# Patient Record
Sex: Female | Born: 1950 | Race: White | Hispanic: No | Marital: Married | Smoking: Never smoker
Health system: Southern US, Community
[De-identification: ages and names within clinical notes are randomized; demographics above are authoritative.]

## PROBLEM LIST (undated history)

## (undated) DIAGNOSIS — K219 Gastro-esophageal reflux disease without esophagitis: Secondary | ICD-10-CM

## (undated) DIAGNOSIS — N898 Other specified noninflammatory disorders of vagina: Secondary | ICD-10-CM

## (undated) DIAGNOSIS — E7800 Pure hypercholesterolemia, unspecified: Secondary | ICD-10-CM

## (undated) DIAGNOSIS — I10 Essential (primary) hypertension: Secondary | ICD-10-CM

## (undated) DIAGNOSIS — R42 Dizziness and giddiness: Secondary | ICD-10-CM

## (undated) DIAGNOSIS — K5730 Diverticulosis of large intestine without perforation or abscess without bleeding: Secondary | ICD-10-CM

## (undated) HISTORY — PX: TUBAL LIGATION: SHX77

## (undated) HISTORY — DX: Diverticulosis of large intestine without perforation or abscess without bleeding: K57.30

## (undated) HISTORY — PX: APPENDECTOMY: SHX54

## (undated) HISTORY — DX: Dizziness and giddiness: R42

## (undated) HISTORY — DX: Other specified noninflammatory disorders of vagina: N89.8

## (undated) HISTORY — PX: BREAST BIOPSY: SHX20

## (undated) HISTORY — PX: BREAST EXCISIONAL BIOPSY: SUR124

---

## 2002-05-09 ENCOUNTER — Encounter: Payer: Self-pay | Admitting: Family Medicine

## 2002-05-09 ENCOUNTER — Encounter: Admission: RE | Admit: 2002-05-09 | Discharge: 2002-05-09 | Payer: Self-pay | Admitting: Family Medicine

## 2002-07-25 ENCOUNTER — Encounter: Admission: RE | Admit: 2002-07-25 | Discharge: 2002-07-25 | Payer: Self-pay | Admitting: Family Medicine

## 2002-07-25 ENCOUNTER — Encounter: Payer: Self-pay | Admitting: Family Medicine

## 2003-01-17 ENCOUNTER — Other Ambulatory Visit: Admission: RE | Admit: 2003-01-17 | Discharge: 2003-01-17 | Payer: Self-pay | Admitting: Family Medicine

## 2006-06-28 ENCOUNTER — Encounter: Admission: RE | Admit: 2006-06-28 | Discharge: 2006-06-28 | Payer: Self-pay | Admitting: Internal Medicine

## 2006-07-17 ENCOUNTER — Encounter: Admission: RE | Admit: 2006-07-17 | Discharge: 2006-07-17 | Payer: Self-pay | Admitting: Internal Medicine

## 2007-07-17 ENCOUNTER — Encounter: Admission: RE | Admit: 2007-07-17 | Discharge: 2007-07-17 | Payer: Self-pay | Admitting: Internal Medicine

## 2007-07-21 ENCOUNTER — Encounter: Admission: RE | Admit: 2007-07-21 | Discharge: 2007-07-21 | Payer: Self-pay | Admitting: Internal Medicine

## 2007-07-30 ENCOUNTER — Emergency Department (HOSPITAL_COMMUNITY): Admission: EM | Admit: 2007-07-30 | Discharge: 2007-07-31 | Payer: Self-pay | Admitting: Emergency Medicine

## 2007-09-04 ENCOUNTER — Encounter: Admission: RE | Admit: 2007-09-04 | Discharge: 2007-09-04 | Payer: Self-pay | Admitting: Cardiology

## 2007-09-08 ENCOUNTER — Ambulatory Visit (HOSPITAL_COMMUNITY): Admission: RE | Admit: 2007-09-08 | Discharge: 2007-09-08 | Payer: Self-pay | Admitting: Cardiology

## 2008-08-28 ENCOUNTER — Encounter: Admission: RE | Admit: 2008-08-28 | Discharge: 2008-08-28 | Payer: Self-pay | Admitting: Internal Medicine

## 2009-08-20 ENCOUNTER — Encounter: Admission: RE | Admit: 2009-08-20 | Discharge: 2009-08-20 | Payer: Self-pay | Admitting: Internal Medicine

## 2010-09-24 ENCOUNTER — Encounter: Admission: RE | Admit: 2010-09-24 | Discharge: 2010-09-24 | Payer: Self-pay | Admitting: Obstetrics and Gynecology

## 2011-01-17 ENCOUNTER — Encounter (HOSPITAL_COMMUNITY): Payer: Self-pay | Admitting: Obstetrics and Gynecology

## 2011-01-17 ENCOUNTER — Encounter: Payer: Self-pay | Admitting: Internal Medicine

## 2011-05-11 NOTE — Cardiovascular Report (Signed)
Brenda Contreras, Brenda Contreras               ACCOUNT NO.:  [id number]   MEDICAL RECORD NO.:  [id number]          PATIENT TYPE:  OIB   LOCATION:  2854                         FACILITY:  [facility]   PHYSICIAN:  Nanetta Batty, M.D.   DATE OF BIRTH:  12/23/1951   DATE OF PROCEDURE:  09/08/2007  DATE OF DISCHARGE:  09/08/2007                            CARDIAC CATHETERIZATION   PROCEDURES PERFORMED:  1. Cardiac catheterization.  2. Selective coronary angiography.  3. Left ventriculography.   CARDIOLOGIST:  Nanetta Batty, M.D.   HISTORY OF THE PRESENT ILLNESS:  The patient is a 60 year old moderately  overweight white female with positive risk factors and a negative  Myoview a year ago with ongoing chest pain.  She was seen by Dr. Jacinto Halim  and was referred for diagnostic coronary arteriography to define her  anatomy and rule out ischemic etiology.   DESCRIPTION OF THE PROCEDURE:  The patient was brought to the second  floor [place] cardiac cath lab in the postabsorptive state.  She was  premedicated with per os Valium, IV fentanyl and Versed.  Her right  groin was prepped and shaved in the usual sterile fashion.  Four  milligrams of Xylocaine was use for local anesthesia.  A 6- French  sheath was inserted into the right femoral artery using the standard  Seldinger technique.  Six French right and left Judkins diagnostic  catheters as well as a 6-French pigtail catheter were used for selective  coronary angiography and left ventriculography respectively.  Visipaque  dye was used for the entirety of the case.  Aortic, left ventricular and  pullback pressures were recorded.   RESULTS:   HEMODYNAMIC DATA:  1. Aortic systolic pressure 179 and diastolic pressure 90.  2. Left ventricular systolic pressure 177 and diastolic pressure 43.   ANGIOGRAPHIC DATA:  Selective coronary angiography:  1. Left Main:  The left main is normal.   1. Left Anterior Descending:  The LAD is normal.   1.  Circumflex:  The left circumflex is normal.   1. Right Coronary Artery:  The right coronary artery was dominant and      normal.   1. Left Ventriculography:  RAO left ventriculogram was performed using      25 mL of Visipaque dye at 12 mL per second.  The overall LVEF was      estimated a greater than 60% without focal wall motion      abnormalities.   IMPRESSION:  The patient has some normal coronaries and normal left  ventricular function.  I believe her chest pain is noncardiac.  Empiric  antireflux therapy will be recommended.   The sheaths were removed and pressure was applied to the groin to  achieve hemostasis.   The patient left lab in stable condition.  She will be discharged home  later today as an outpatient; and, she will see Dr. Jacinto Halim back in follow-  up.      Nanetta Batty, M.D.  Electronically Signed     JB/MEDQ  D:  09/08/2007  T:  09/09/2007  Job:  [id number]   cc:   Second  Floor [facility]  [facility]  Robyn N. Allyne Gee, M.D.

## 2011-10-08 ENCOUNTER — Other Ambulatory Visit: Payer: Self-pay | Admitting: Obstetrics and Gynecology

## 2011-10-08 DIAGNOSIS — R928 Other abnormal and inconclusive findings on diagnostic imaging of breast: Secondary | ICD-10-CM

## 2011-10-11 LAB — I-STAT 8, (EC8 V) (CONVERTED LAB)
Acid-Base Excess: 1
BUN: 13
Bicarbonate: 26.1 — ABNORMAL HIGH
Chloride: 106
Glucose, Bld: 175 — ABNORMAL HIGH
HCT: 38
Hemoglobin: 12.9
Operator id: 277751
Potassium: 3.8
Sodium: 140
TCO2: 27
pCO2, Ven: 41.4 — ABNORMAL LOW
pH, Ven: 7.408 — ABNORMAL HIGH

## 2011-10-11 LAB — POCT I-STAT CREATININE
Creatinine, Ser: 1.4 — ABNORMAL HIGH
Operator id: 277751

## 2011-10-11 LAB — CBC
HCT: 35.8 — ABNORMAL LOW
Hemoglobin: 11.9 — ABNORMAL LOW
MCHC: 33.1
MCV: 85.3
Platelets: 216
RBC: 4.2
RDW: 13.8
WBC: 6.5

## 2011-10-11 LAB — DIFFERENTIAL
Basophils Absolute: 0
Basophils Relative: 1
Eosinophils Absolute: 0.2
Eosinophils Relative: 3
Lymphocytes Relative: 33
Lymphs Abs: 2.1
Monocytes Absolute: 0.6
Monocytes Relative: 10
Neutro Abs: 3.5
Neutrophils Relative %: 54

## 2011-10-11 LAB — POCT CARDIAC MARKERS
CKMB, poc: 1.3
Myoglobin, poc: 171
Operator id: 277751
Troponin i, poc: <0.05

## 2011-10-20 ENCOUNTER — Ambulatory Visit
Admission: RE | Admit: 2011-10-20 | Discharge: 2011-10-20 | Disposition: A | Discharge status: Home / Self Care | Source: Ambulatory Visit | Attending: Obstetrics and Gynecology | Admitting: Obstetrics and Gynecology

## 2011-10-20 ENCOUNTER — Other Ambulatory Visit: Payer: Self-pay | Admitting: Obstetrics and Gynecology

## 2011-10-20 DIAGNOSIS — R921 Mammographic calcification found on diagnostic imaging of breast: Secondary | ICD-10-CM

## 2011-10-20 DIAGNOSIS — R928 Other abnormal and inconclusive findings on diagnostic imaging of breast: Secondary | ICD-10-CM

## 2011-10-22 ENCOUNTER — Encounter

## 2011-11-01 ENCOUNTER — Other Ambulatory Visit: Payer: Self-pay | Admitting: Radiology

## 2011-11-01 ENCOUNTER — Ambulatory Visit
Admission: RE | Admit: 2011-11-01 | Discharge: 2011-11-01 | Disposition: A | Discharge status: Home / Self Care | Source: Ambulatory Visit | Attending: Obstetrics and Gynecology | Admitting: Obstetrics and Gynecology

## 2011-11-01 DIAGNOSIS — R921 Mammographic calcification found on diagnostic imaging of breast: Secondary | ICD-10-CM

## 2012-08-02 ENCOUNTER — Encounter (HOSPITAL_COMMUNITY): Payer: Self-pay | Admitting: Emergency Medicine

## 2012-08-02 ENCOUNTER — Emergency Department (HOSPITAL_COMMUNITY)

## 2012-08-02 ENCOUNTER — Emergency Department (HOSPITAL_COMMUNITY)
Admission: EM | Admit: 2012-08-02 | Discharge: 2012-08-03 | Disposition: A | Discharge status: Home / Self Care | Attending: Emergency Medicine | Admitting: Emergency Medicine

## 2012-08-02 DIAGNOSIS — E7800 Pure hypercholesterolemia, unspecified: Secondary | ICD-10-CM | POA: Insufficient documentation

## 2012-08-02 DIAGNOSIS — N390 Urinary tract infection, site not specified: Secondary | ICD-10-CM

## 2012-08-02 DIAGNOSIS — E119 Type 2 diabetes mellitus without complications: Secondary | ICD-10-CM | POA: Insufficient documentation

## 2012-08-02 DIAGNOSIS — I10 Essential (primary) hypertension: Secondary | ICD-10-CM | POA: Insufficient documentation

## 2012-08-02 DIAGNOSIS — M549 Dorsalgia, unspecified: Secondary | ICD-10-CM

## 2012-08-02 DIAGNOSIS — K219 Gastro-esophageal reflux disease without esophagitis: Secondary | ICD-10-CM | POA: Insufficient documentation

## 2012-08-02 HISTORY — DX: Gastro-esophageal reflux disease without esophagitis: K21.9

## 2012-08-02 HISTORY — DX: Essential (primary) hypertension: I10

## 2012-08-02 HISTORY — DX: Pure hypercholesterolemia, unspecified: E78.00

## 2012-08-02 NOTE — ED Notes (Signed)
NP at bedside.
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
## 2012-08-02 NOTE — ED Notes (Addendum)
Pt c/o pain in lower back and bil hips onset this am.  No known injury.  St's pain mostly in hips.

## 2012-08-03 LAB — URINALYSIS, ROUTINE W REFLEX MICROSCOPIC
Bilirubin Urine: NEGATIVE
Glucose, UA: NEGATIVE mg/dL
Hgb urine dipstick: NEGATIVE
Ketones, ur: NEGATIVE mg/dL
Nitrite: NEGATIVE
Protein, ur: NEGATIVE mg/dL
Specific Gravity, Urine: 1.01 (ref 1.005–1.030)
Urobilinogen, UA: 0.2 mg/dL (ref 0.0–1.0)
pH: 5.5 (ref 5.0–8.0)

## 2012-08-03 LAB — URINE MICROSCOPIC-ADD ON

## 2012-08-03 MED ORDER — SULFAMETHOXAZOLE-TMP DS 800-160 MG PO TABS
1.0000 | ORAL_TABLET | Freq: Once | ORAL | Status: AC
  Administered 2012-08-03: 1 via ORAL
  Filled 2012-08-03: qty 1

## 2012-08-03 MED ORDER — SULFAMETHOXAZOLE-TMP DS 800-160 MG PO TABS: 1.0000 | ORAL_TABLET | Freq: Two times a day (BID) | ORAL | Status: AC

## 2012-08-03 MED ORDER — HYDROCODONE-ACETAMINOPHEN 5-325 MG PO TABS
1.0000 | ORAL_TABLET | Freq: Once | ORAL | Status: AC
  Administered 2012-08-03: 1 via ORAL
  Filled 2012-08-03: qty 1

## 2012-08-03 MED ORDER — HYDROCODONE-ACETAMINOPHEN 5-325 MG PO TABS: 1.0000 | ORAL_TABLET | Freq: Four times a day (QID) | ORAL | Status: AC | PRN

## 2012-08-03 NOTE — ED Provider Notes (Signed)
Medical screening examination/treatment/procedure(s) were performed by non-physician practitioner and as supervising physician I was immediately available for consultation/collaboration.  Kathy Wares K Talana Slatten-Rasch, MD 08/03/12 0425 

## 2013-06-18 ENCOUNTER — Other Ambulatory Visit: Payer: Self-pay | Admitting: Gastroenterology

## 2013-06-18 DIAGNOSIS — R109 Unspecified abdominal pain: Secondary | ICD-10-CM

## 2013-06-25 ENCOUNTER — Ambulatory Visit
Admission: RE | Admit: 2013-06-25 | Discharge: 2013-06-25 | Disposition: A | Discharge status: Home / Self Care | Source: Ambulatory Visit | Attending: Gastroenterology | Admitting: Gastroenterology

## 2013-06-25 DIAGNOSIS — R109 Unspecified abdominal pain: Secondary | ICD-10-CM

## 2013-06-25 MED ORDER — IOHEXOL 300 MG/ML  SOLN
100.0000 mL | Freq: Once | INTRAMUSCULAR | Status: AC | PRN
  Administered 2013-06-25: 100 mL via INTRAVENOUS

## 2013-09-25 ENCOUNTER — Other Ambulatory Visit: Payer: Self-pay | Admitting: Obstetrics and Gynecology

## 2013-09-25 DIAGNOSIS — R928 Other abnormal and inconclusive findings on diagnostic imaging of breast: Secondary | ICD-10-CM

## 2013-10-12 ENCOUNTER — Ambulatory Visit
Admission: RE | Admit: 2013-10-12 | Discharge: 2013-10-12 | Disposition: A | Discharge status: Home / Self Care | Source: Ambulatory Visit | Attending: Obstetrics and Gynecology | Admitting: Obstetrics and Gynecology

## 2013-10-12 DIAGNOSIS — R928 Other abnormal and inconclusive findings on diagnostic imaging of breast: Secondary | ICD-10-CM

## 2013-12-16 ENCOUNTER — Encounter (HOSPITAL_COMMUNITY): Payer: Self-pay | Admitting: Emergency Medicine

## 2013-12-16 ENCOUNTER — Emergency Department (INDEPENDENT_AMBULATORY_CARE_PROVIDER_SITE_OTHER)
Admission: EM | Admit: 2013-12-16 | Discharge: 2013-12-16 | Disposition: A | Discharge status: Home / Self Care | Source: Home / Self Care | Attending: Family Medicine | Admitting: Family Medicine

## 2013-12-16 DIAGNOSIS — J069 Acute upper respiratory infection, unspecified: Secondary | ICD-10-CM

## 2013-12-16 MED ORDER — IPRATROPIUM BROMIDE 0.03 % NA SOLN: 2.0000 | Freq: Two times a day (BID) | NASAL | Status: DC

## 2013-12-16 MED ORDER — BENZONATATE 100 MG PO CAPS: 100.0000 mg | ORAL_CAPSULE | Freq: Three times a day (TID) | ORAL | Status: DC | PRN

## 2013-12-16 NOTE — ED Notes (Signed)
C/o cold sx for two days now States she has been sneezing, body ache, runny nose, dry cough OTC medication taking but no relief.

## 2013-12-16 NOTE — ED Provider Notes (Signed)
CSN: [id number]     Arrival date & time 12/16/13  1458 History   First MD Initiated Contact with Patient 12/16/13 1548     Chief Complaint  Patient presents with  . URI   (Consider location/radiation/quality/duration/timing/severity/associated sxs/prior Treatment) Patient is a 62 y.o. female presenting with URI. The history is provided by the patient.  URI Presenting symptoms: congestion, cough and rhinorrhea   Presenting symptoms: no ear pain, no facial pain, no fever and no sore throat   Presenting symptoms comment:  +sneezing and myalgias Severity:  Moderate Onset quality:  Gradual Duration:  3 days Progression:  Unchanged Chronicity:  New Associated symptoms: myalgias and sneezing     Past Medical History  Diagnosis Date  . Diabetes mellitus   . Hypertension   . High cholesterol   . GERD (gastroesophageal reflux disease)    History reviewed. No pertinent past surgical history. History reviewed. No pertinent family history. History  Substance Use Topics  . Smoking status: Never Smoker   . Smokeless tobacco: Not on file  . Alcohol Use: No   OB History   Grav Para Term Preterm Abortions TAB SAB Ect Mult Living                 Review of Systems  Constitutional: Negative for fever.  HENT: Positive for congestion, rhinorrhea and sneezing. Negative for ear pain and sore throat.   Respiratory: Positive for cough.   Musculoskeletal: Positive for myalgias.  All other systems reviewed and are negative.    Allergies  Review of patient's allergies indicates no known allergies.  Home Medications   Current Outpatient Rx  Name  Route  Sig  Dispense  Refill  . amLODipine (NORVASC) 5 MG tablet   Oral   Take 5 mg by mouth daily.         Marland Kitchen aspirin EC 81 MG tablet   Oral   Take 81 mg by mouth daily.         . benzonatate (TESSALON) 100 MG capsule   Oral   Take 1 capsule (100 mg total) by mouth 3 (three) times daily as needed for cough.   21 capsule   0   .  ipratropium (ATROVENT) 0.03 % nasal spray   Each Nare   Place 2 sprays into both nostrils every 12 (twelve) hours.   30 mL   12   . olmesartan-hydrochlorothiazide (BENICAR HCT) 40-25 MG per tablet   Oral   Take 1 tablet by mouth daily.         Marland Kitchen omeprazole-sodium bicarbonate (ZEGERID) 40-1100 MG per capsule   Oral   Take 1 capsule by mouth daily before breakfast.         . pregabalin (LYRICA) 50 MG capsule   Oral   Take 50 mg by mouth daily.         . rosuvastatin (CRESTOR) 10 MG tablet   Oral   Take 10 mg by mouth every evening.         . sitaGLIPtan-metformin (JANUMET) 50-1000 MG per tablet   Oral   Take 1 tablet by mouth 2 (two) times daily with a meal.          BP 151/77  Pulse 107  Temp(Src) 98.4 F (36.9 C) (Oral)  Resp 20  SpO2 98% Physical Exam  Nursing note and vitals reviewed. Constitutional: She is oriented to person, place, and time. She appears well-developed and well-nourished. No distress.  HENT:  Head: Normocephalic and atraumatic.  Right Ear: Hearing, tympanic membrane, external ear and ear canal normal.  Left Ear: Hearing, tympanic membrane, external ear and ear canal normal.  Nose: Nose normal.  Mouth/Throat: Uvula is midline, oropharynx is clear and moist and mucous membranes are normal.  Eyes: Conjunctivae are normal. Pupils are equal, round, and reactive to light. Right eye exhibits no discharge. Left eye exhibits no discharge. No scleral icterus.  Neck: Normal range of motion. Neck supple. No thyromegaly present.  Cardiovascular: Normal rate, regular rhythm and normal heart sounds.   Pulmonary/Chest: Effort normal and breath sounds normal. No respiratory distress. She has no wheezes.  Abdominal: Soft. Bowel sounds are normal. There is no tenderness.  Musculoskeletal: Normal range of motion.  Lymphadenopathy:    She has no cervical adenopathy.  Neurological: She is alert and oriented to person, place, and time.  Skin: Skin is warm and  dry. No rash noted.  Psychiatric: She has a normal mood and affect. Her behavior is normal.    ED Course  Procedures (including critical care time) Labs Review Labs Reviewed - No data to display Imaging Review No results found.  EKG Interpretation    Date/Time:    Ventricular Rate:    PR Interval:    QRS Duration:   QT Interval:    QTC Calculation:   R Axis:     Text Interpretation:              MDM  Exam consistent with mild URI. Symptomatic care at home with Atrovent nasal spray and tessalon. PCP follow up if no improvement over next 5-7 days.     Jess Barters [place], [place] 12/16/13 [phone]

## 2013-12-17 NOTE — ED Provider Notes (Signed)
Medical screening examination/treatment/procedure(s) were performed by a resident physician or non-physician practitioner and as the supervising physician I was immediately available for consultation/collaboration.  Taite Schoeppner, MD    Aylinn Rydberg S Nahzir Pohle, MD 12/17/13 0739 

## 2014-04-30 ENCOUNTER — Emergency Department (HOSPITAL_COMMUNITY)
Admission: EM | Admit: 2014-04-30 | Discharge: 2014-05-01 | Disposition: A | Discharge status: Home / Self Care | Attending: Emergency Medicine | Admitting: Emergency Medicine

## 2014-04-30 ENCOUNTER — Emergency Department (HOSPITAL_COMMUNITY)

## 2014-04-30 ENCOUNTER — Encounter (HOSPITAL_COMMUNITY): Payer: Self-pay | Admitting: Emergency Medicine

## 2014-04-30 DIAGNOSIS — E7800 Pure hypercholesterolemia, unspecified: Secondary | ICD-10-CM | POA: Insufficient documentation

## 2014-04-30 DIAGNOSIS — Z7982 Long term (current) use of aspirin: Secondary | ICD-10-CM | POA: Insufficient documentation

## 2014-04-30 DIAGNOSIS — Z9889 Other specified postprocedural states: Secondary | ICD-10-CM | POA: Insufficient documentation

## 2014-04-30 DIAGNOSIS — E119 Type 2 diabetes mellitus without complications: Secondary | ICD-10-CM | POA: Insufficient documentation

## 2014-04-30 DIAGNOSIS — K219 Gastro-esophageal reflux disease without esophagitis: Secondary | ICD-10-CM | POA: Insufficient documentation

## 2014-04-30 DIAGNOSIS — Z79899 Other long term (current) drug therapy: Secondary | ICD-10-CM | POA: Insufficient documentation

## 2014-04-30 DIAGNOSIS — I10 Essential (primary) hypertension: Secondary | ICD-10-CM | POA: Insufficient documentation

## 2014-04-30 DIAGNOSIS — R0789 Other chest pain: Secondary | ICD-10-CM | POA: Insufficient documentation

## 2014-04-30 DIAGNOSIS — H81399 Other peripheral vertigo, unspecified ear: Secondary | ICD-10-CM | POA: Insufficient documentation

## 2014-04-30 LAB — CBC
HCT: 37 % (ref 36.0–46.0)
Hemoglobin: 12 g/dL (ref 12.0–15.0)
MCH: 28.1 pg (ref 26.0–34.0)
MCHC: 32.4 g/dL (ref 30.0–36.0)
MCV: 86.7 fL (ref 78.0–100.0)
Platelets: 230 10*3/uL (ref 150–400)
RBC: 4.27 MIL/uL (ref 3.87–5.11)
RDW: 14.7 % (ref 11.5–15.5)
WBC: 7.5 10*3/uL (ref 4.0–10.5)

## 2014-04-30 LAB — BASIC METABOLIC PANEL
BUN: 19 mg/dL (ref 6–23)
CO2: 24 mEq/L (ref 19–32)
Calcium: 10.1 mg/dL (ref 8.4–10.5)
Chloride: 96 mEq/L (ref 96–112)
Creatinine, Ser: 0.97 mg/dL (ref 0.50–1.10)
GFR calc Af Amer: 71 mL/min — ABNORMAL LOW (ref >90)
GFR calc non Af Amer: 61 mL/min — ABNORMAL LOW (ref >90)
Glucose, Bld: 91 mg/dL (ref 70–99)
Potassium: 3.6 mEq/L — ABNORMAL LOW (ref 3.7–5.3)
Sodium: 136 mEq/L — ABNORMAL LOW (ref 137–147)

## 2014-04-30 LAB — I-STAT TROPONIN, ED: Troponin i, poc: 0.01 ng/mL (ref 0.00–0.08)

## 2014-04-30 MED ORDER — ASPIRIN 81 MG PO CHEW
324.0000 mg | CHEWABLE_TABLET | Freq: Once | ORAL | Status: AC
  Administered 2014-05-01: 324 mg via ORAL
  Filled 2014-04-30: qty 4

## 2014-04-30 MED ORDER — MECLIZINE HCL 25 MG PO TABS
25.0000 mg | ORAL_TABLET | Freq: Once | ORAL | Status: AC
  Administered 2014-05-01: 25 mg via ORAL
  Filled 2014-04-30: qty 1

## 2014-04-30 MED ORDER — POTASSIUM CHLORIDE CRYS ER 20 MEQ PO TBCR
40.0000 meq | EXTENDED_RELEASE_TABLET | Freq: Once | ORAL | Status: AC
  Administered 2014-05-01: 40 meq via ORAL
  Filled 2014-04-30: qty 2

## 2014-04-30 MED ORDER — PANTOPRAZOLE SODIUM 40 MG PO TBEC
40.0000 mg | DELAYED_RELEASE_TABLET | Freq: Once | ORAL | Status: AC
  Administered 2014-05-01: 40 mg via ORAL
  Filled 2014-04-30: qty 1

## 2014-04-30 NOTE — ED Notes (Signed)
Pt reports feeling dizzy today and then started to have CP in afternoon; pt denies n/v, reports slight SOB

## 2014-04-30 NOTE — ED Provider Notes (Signed)
CSN: [id number]     Arrival date & time 04/30/14  1956 History   First MD Initiated Contact with Patient 04/30/14 2259     Chief Complaint  Patient presents with  . Chest Pain  . Dizziness     (Consider location/radiation/quality/duration/timing/severity/associated sxs/prior Treatment) Patient is a 63 y.o. female presenting with chest pain and dizziness. The history is provided by the patient.  Chest Pain Associated symptoms: dizziness   Dizziness Associated symptoms: chest pain   She had onset this afternoon of dizziness. She states the dizziness is a sense of feeling off-balance and is worse when she stands up but resolves after she stays standing. She denies any spinning sensation or sensation of blacking out. There is no associated nausea or vomiting. She denies tinnitus or decreased hearing or ear pain. This evening, she started having episodes of chest pain. Pain was a dull feeling in the left anterior chest and a burning sensation in the retrosternal area. This comes and goes. Her last about 5-10 minutes before resolving. She states that she is breathing a little faster than normal but not truly dyspneic. She denies nausea or vomiting or diaphoresis. Nothing seems to make the pain better or worse. It is not exertional and not positional. She did have a heart catheterization in the past and was told that it was normal and she continues to see a cardiologist. Last stress test was about 2 years ago and was reported to be normal. She does have cardiac risk factors of diabetes, hypertension, hyperlipidemia and a twin sister has had a heart attack and has coronary stents. She is a nonsmoker. Also, she is on omeprazole for acid reflux. She states that when she had her heart catheterization, she was told that her chest pain at that point was due to acid reflux.  Past Medical History  Diagnosis Date  . Diabetes mellitus   . Hypertension   . High cholesterol   . GERD (gastroesophageal reflux  disease)    History reviewed. No pertinent past surgical history. History reviewed. No pertinent family history. History  Substance Use Topics  . Smoking status: Never Smoker   . Smokeless tobacco: Not on file  . Alcohol Use: No   OB History   Grav Para Term Preterm Abortions TAB SAB Ect Mult Living                 Review of Systems  Cardiovascular: Positive for chest pain.  Neurological: Positive for dizziness.  All other systems reviewed and are negative.     Allergies  Review of patient's allergies indicates no known allergies.  Home Medications   Prior to Admission medications   Medication Sig Start Date End Date Taking? Authorizing Provider  amLODipine (NORVASC) 5 MG tablet Take 5 mg by mouth daily.    Historical Provider, MD  aspirin EC 81 MG tablet Take 81 mg by mouth daily.    Historical Provider, MD  benzonatate (TESSALON) 100 MG capsule Take 1 capsule (100 mg total) by mouth 3 (three) times daily as needed for cough. 12/16/13   Lahoma Rocker, PA  ipratropium (ATROVENT) 0.03 % nasal spray Place 2 sprays into both nostrils every 12 (twelve) hours. 12/16/13   Lahoma Rocker, PA  olmesartan-hydrochlorothiazide (BENICAR HCT) 40-25 MG per tablet Take 1 tablet by mouth daily.    Historical Provider, MD  omeprazole-sodium bicarbonate (ZEGERID) 40-1100 MG per capsule Take 1 capsule by mouth daily before breakfast.    Historical Provider, MD  pregabalin (  LYRICA) 50 MG capsule Take 50 mg by mouth daily.    Historical Provider, MD  rosuvastatin (CRESTOR) 10 MG tablet Take 10 mg by mouth every evening.    Historical Provider, MD  sitaGLIPtan-metformin (JANUMET) 50-1000 MG per tablet Take 1 tablet by mouth 2 (two) times daily with a meal.    Historical Provider, MD   BP 154/71  Pulse 80  Temp(Src) 98 F (36.7 C) (Oral)  Resp 18  Ht 5\' 3"  (1.6 m)  Wt 157 lb (71.215 kg)  BMI 27.82 kg/m2  SpO2 98% Physical Exam  Nursing note and vitals reviewed.  63 year old  female, resting comfortably and in no acute distress. Vital signs are significant for hypertension with blood pressure 154/71. Oxygen saturation is 98%, which is normal. Head is normocephalic and atraumatic. PERRLA, EOMI. Oropharynx is clear. No nystagmus is noted on lateral gaze to both sides and she does have mild dizziness with this. Dizziness is not reproduced by passive head movement. Neck is nontender and supple without adenopathy or JVD. Back is nontender and there is no CVA tenderness. Lungs are clear without rales, wheezes, or rhonchi. Chest is nontender. Heart has regular rate and rhythm without murmur. Abdomen is soft, flat, nontender without masses or hepatosplenomegaly and peristalsis is normoactive. Extremities have no cyanosis or edema, full range of motion is present. Skin is warm and dry without rash. Neurologic: Mental status is normal, cranial nerves are intact, there are no motor or sensory deficits.  ED Course  Procedures (including critical care time) Labs Review Labs Reviewed  BASIC METABOLIC PANEL - Abnormal; Notable for the following:    Sodium 136 (*)    Potassium 3.6 (*)    GFR calc non Af Amer 61 (*)    GFR calc Af Amer 71 (*)    All other components within normal limits  CBC  I-STAT TROPOININ, ED    Imaging Review Dg Chest 2 View  04/30/2014   CLINICAL DATA:  Chest pain and dizziness  EXAM: CHEST  2 VIEW  COMPARISON:  09/04/2007  FINDINGS: Normal heart size and mediastinal contours. No acute infiltrate or edema. No effusion or pneumothorax. No acute osseous findings.  IMPRESSION: No active cardiopulmonary disease.   Electronically Signed   By: Jorje Guild M.D.   On: 04/30/2014 21:16     EKG Interpretation   Date/Time:  Tuesday Apr 30 2014 20:06:50 EDT Ventricular Rate:  82 PR Interval:  142 QRS Duration: 84 QT Interval:  384 QTC Calculation: 448 R Axis:   38 Text Interpretation:  Normal sinus rhythm Normal ECG No old tracing to  compare  Confirmed by [facility]  MD, Mikeala Girdler ([id number]) on 04/30/2014 11:01:33 PM      MDM   Final diagnoses:  Peripheral vertigo   Chest pain of uncertain cause. Dizziness which most likely is vertigo given her nystagmus. Old records are reviewed and she had a heart catheterization in 2008 showing completely normal coronary arteries. Although it has been 7 years, it is unlikely that she has developed critical coronary atherosclerosis in that time. ECG is normal as is troponin. Potassium is slightly low at 3.6 which is probably related to diabetic which she takes for hypertension. She will be given a dose of potassium and meclizine. She is also given a dose of pantoprazole. Orthostatic vital signs will be checked.  Orthostatic vital signs show no significant changes in heart rate or blood pressure. She feels significantly better after oral meclizine but does note that dizziness  is now provoked by looking to either side. This is consistent with peripheral vertigo. She is discharged with prescriptions for meclizine and is advised that she should increase her omeprazole to twice a day for the next several days. Followup with PCP.  Delora Fuel, MD 76/72/09 [id number]

## 2014-05-01 MED ORDER — MECLIZINE HCL 25 MG PO TABS: 25.0000 mg | ORAL_TABLET | Freq: Three times a day (TID) | ORAL | Status: DC | PRN

## 2014-05-01 NOTE — Discharge Instructions (Signed)
Vertigo Vertigo means you feel like you or your surroundings are moving when they are not. Vertigo can be dangerous if it occurs when you are at work, driving, or performing difficult activities.  CAUSES  Vertigo occurs when there is a conflict of signals sent to your brain from the visual and sensory systems in your body. There are many different causes of vertigo, including:  Infections, especially in the inner ear.  A bad reaction to a drug or misuse of alcohol and medicines.  Withdrawal from drugs or alcohol.  Rapidly changing positions, such as lying down or rolling over in bed.  A migraine headache.  Decreased blood flow to the brain.  Increased pressure in the brain from a head injury, infection, tumor, or bleeding. SYMPTOMS  You may feel as though the world is spinning around or you are falling to the ground. Because your balance is upset, vertigo can cause nausea and vomiting. You may have involuntary eye movements (nystagmus). DIAGNOSIS  Vertigo is usually diagnosed by physical exam. If the cause of your vertigo is unknown, your caregiver may perform imaging tests, such as an MRI scan (magnetic resonance imaging). TREATMENT  Most cases of vertigo resolve on their own, without treatment. Depending on the cause, your caregiver may prescribe certain medicines. If your vertigo is related to body position issues, your caregiver may recommend movements or procedures to correct the problem. In rare cases, if your vertigo is caused by certain inner ear problems, you may need surgery. HOME CARE INSTRUCTIONS   Follow your caregiver's instructions.  Avoid driving.  Avoid operating heavy machinery.  Avoid performing any tasks that would be dangerous to you or others during a vertigo episode.  Tell your caregiver if you notice that certain medicines seem to be causing your vertigo. Some of the medicines used to treat vertigo episodes can actually make them worse in some people. SEEK  IMMEDIATE MEDICAL CARE IF:   Your medicines do not relieve your vertigo or are making it worse.  You develop problems with talking, walking, weakness, or using your arms, hands, or legs.  You develop severe headaches.  Your nausea or vomiting continues or gets worse.  You develop visual changes.  A family member notices behavioral changes.  Your condition gets worse. MAKE SURE YOU:  Understand these instructions.  Will watch your condition.  Will get help right away if you are not doing well or get worse. Document Released: 09/22/2005 Document Revised: 03/06/2012 Document Reviewed: 07/01/2011 [facility] Patient Information 2014 [place].  Meclizine tablets or capsules What is this medicine? MECLIZINE (MEK li zeen) is an antihistamine. It is used to prevent nausea, vomiting, or dizziness caused by motion sickness. It is also used to prevent and treat vertigo (extreme dizziness or a feeling that you or your surroundings are tilting or spinning around). This medicine may be used for other purposes; ask your health care provider or pharmacist if you have questions. COMMON BRAND NAME(S): Antivert, Dramamine Less Drowsy, Medivert, Meni-D  What should I tell my health care provider before I take this medicine? They need to know if you have any of these conditions: -asthma -glaucoma -prostate trouble -stomach problems -urinary problems -an unusual or allergic reaction to meclizine, other medicines, foods, dyes, or preservatives -pregnant or trying to get pregnant -breast-feeding How should I use this medicine? Take this medicine by mouth with a glass of water. Follow the directions on the prescription label. If you are using this medicine to prevent motion sickness, take  the dose at least 1 hour before travel. If it upsets your stomach, take it with food or milk. Take your doses at regular intervals. Do not take your medicine more often than directed. Talk to your pediatrician  regarding the use of this medicine in children. Special care may be needed. Overdosage: If you think you have taken too much of this medicine contact a poison control center or emergency room at once. NOTE: This medicine is only for you. Do not share this medicine with others. What if I miss a dose? If you miss a dose, take it as soon as you can. If it is almost time for your next dose, take only that dose. Do not take double or extra doses. What may interact with this medicine? -barbiturate medicines for inducing sleep or treating seizures -digoxin -medicines for anxiety or sleeping problems, like alprazolam, diazepam or temazepam -medicines for hay fever and other allergies -medicines for mental depression -medicines for movement abnormalities as in Parkinson's disease, or for stomach problems -medicines for pain -medicines that relax muscles This list may not describe all possible interactions. Give your health care provider a list of all the medicines, herbs, non-prescription drugs, or dietary supplements you use. Also tell them if you smoke, drink alcohol, or use illegal drugs. Some items may interact with your medicine. What should I watch for while using this medicine? If you are taking this medicine on a regular schedule, visit your doctor or health care professional for regular checks on your progress. You may get dizzy, drowsy or have blurred vision. Do not drive, use machinery, or do anything that needs mental alertness until you know how this medicine affects you. Do not stand or sit up quickly, especially if you are an older patient. This reduces the risk of dizzy or fainting spells. Alcohol can increase possible dizziness. Avoid alcoholic drinks. Your mouth may get dry. Chewing sugarless gum or sucking hard candy, and drinking plenty of water may help. Contact your doctor if the problem does not go away or is severe. This medicine may cause dry eyes and blurred vision. If you wear  contact lenses you may feel some discomfort. Lubricating drops may help. See your eye doctor if the problem does not go away or is severe. What side effects may I notice from receiving this medicine? Side effects that you should report to your doctor or health care professional as soon as possible: -fainting spells -fast or irregular heartbeat Side effects that usually do not require medical attention (report to your doctor or health care professional if they continue or are bothersome): -constipation -difficulty passing urine -difficulty sleeping -headache -stomach upset This list may not describe all possible side effects. Call your doctor for medical advice about side effects. You may report side effects to FDA at 1-800-FDA-1088. Where should I keep my medicine? Keep out of the reach of children. Store at room temperature between 15 and 30 degrees C (59 and 86 degrees F). Keep container tightly closed. Throw away any unused medicine after the expiration date. NOTE: This sheet is a summary. It may not cover all possible information. If you have questions about this medicine, talk to your doctor, pharmacist, or health care provider.  2014, [employer]vier/[employer] Standard. (2008-06-20 10:35:36)

## 2014-05-01 NOTE — ED Notes (Signed)
Patient has ride home with husband

## 2015-05-13 DIAGNOSIS — D251 Intramural leiomyoma of uterus: Secondary | ICD-10-CM | POA: Diagnosis not present

## 2015-08-27 DIAGNOSIS — E559 Vitamin D deficiency, unspecified: Secondary | ICD-10-CM | POA: Diagnosis not present

## 2015-08-27 DIAGNOSIS — Z0000 Encounter for general adult medical examination without abnormal findings: Secondary | ICD-10-CM | POA: Diagnosis not present

## 2015-08-27 DIAGNOSIS — E780 Pure hypercholesterolemia: Secondary | ICD-10-CM | POA: Diagnosis not present

## 2015-08-27 DIAGNOSIS — Z23 Encounter for immunization: Secondary | ICD-10-CM | POA: Diagnosis not present

## 2015-08-27 DIAGNOSIS — Z6829 Body mass index (BMI) 29.0-29.9, adult: Secondary | ICD-10-CM | POA: Diagnosis not present

## 2015-08-27 DIAGNOSIS — E1165 Type 2 diabetes mellitus with hyperglycemia: Secondary | ICD-10-CM | POA: Diagnosis not present

## 2015-08-27 DIAGNOSIS — I10 Essential (primary) hypertension: Secondary | ICD-10-CM | POA: Diagnosis not present

## 2015-11-10 DIAGNOSIS — Z6828 Body mass index (BMI) 28.0-28.9, adult: Secondary | ICD-10-CM | POA: Diagnosis not present

## 2015-11-10 DIAGNOSIS — Z1231 Encounter for screening mammogram for malignant neoplasm of breast: Secondary | ICD-10-CM | POA: Diagnosis not present

## 2015-11-10 DIAGNOSIS — Z01419 Encounter for gynecological examination (general) (routine) without abnormal findings: Secondary | ICD-10-CM | POA: Diagnosis not present

## 2015-11-27 DIAGNOSIS — E1165 Type 2 diabetes mellitus with hyperglycemia: Secondary | ICD-10-CM | POA: Diagnosis not present

## 2015-11-27 DIAGNOSIS — E7800 Pure hypercholesterolemia, unspecified: Secondary | ICD-10-CM | POA: Diagnosis not present

## 2015-11-27 DIAGNOSIS — Z6829 Body mass index (BMI) 29.0-29.9, adult: Secondary | ICD-10-CM | POA: Diagnosis not present

## 2015-11-27 DIAGNOSIS — I10 Essential (primary) hypertension: Secondary | ICD-10-CM | POA: Diagnosis not present

## 2016-01-13 ENCOUNTER — Encounter: Payer: Self-pay | Admitting: Family Medicine

## 2016-01-13 ENCOUNTER — Ambulatory Visit (INDEPENDENT_AMBULATORY_CARE_PROVIDER_SITE_OTHER): Payer: Medicare Other | Admitting: Family Medicine

## 2016-01-13 VITALS — BP 146/82 | HR 79 | Temp 98.1°F | Ht 62.75 in | Wt 159.8 lb

## 2016-01-13 DIAGNOSIS — E1165 Type 2 diabetes mellitus with hyperglycemia: Secondary | ICD-10-CM | POA: Insufficient documentation

## 2016-01-13 DIAGNOSIS — I10 Essential (primary) hypertension: Secondary | ICD-10-CM | POA: Diagnosis not present

## 2016-01-13 DIAGNOSIS — E785 Hyperlipidemia, unspecified: Secondary | ICD-10-CM | POA: Insufficient documentation

## 2016-01-13 DIAGNOSIS — E119 Type 2 diabetes mellitus without complications: Secondary | ICD-10-CM | POA: Insufficient documentation

## 2016-01-13 DIAGNOSIS — K219 Gastro-esophageal reflux disease without esophagitis: Secondary | ICD-10-CM | POA: Diagnosis not present

## 2016-01-13 DIAGNOSIS — N898 Other specified noninflammatory disorders of vagina: Secondary | ICD-10-CM

## 2016-01-13 NOTE — Patient Instructions (Signed)
Great to meet you. Please schedule a welcome to medicare visit.

## 2016-01-13 NOTE — Progress Notes (Signed)
Subjective:   Patient ID: Brenda Contreras, female    DOB: 09/26/1951, 65 y.o.   MRN: [id number]  Brenda Contreras is a pleasant 65 y.o. year old female who presents to clinic today with Establish Care  on 01/13/2016  HPI:  DM- currently taking Janumet 50-1000 mg - 1 tab by mouth twice daily. Diagnosed approximately 10 years ago.  a1c was 7.2 last month- brings in labs with her today. Checks FSBS twice daily fasting 128-153. Eye exam UTD.  HTN- has been well controlled on Benicar HCT, norvasc 5 mg daily, and coreg 6.25 mg nightly. Dr. Nadyne Coombes is her cardiologist.  Per pt, neg cath 3 or 4 years ago.  Also takes ASA daily.  Has never had an MI>  HLD- takes Crestor 10 mg daily.  Denies myalgias.   GERD- symptoms have been controlled with Zegerid.  She is on HRT- vagifem for vaginal dryness.  Sees Dr. Julien Girt, GYN.  Last saw her in 10/2015. Pt also reports that she has fibroids.    Current Outpatient Prescriptions on File Prior to Visit  Medication Sig Dispense Refill  . amLODipine (NORVASC) 5 MG tablet Take 5 mg by mouth daily.    Marland Kitchen aspirin EC 81 MG tablet Take 81 mg by mouth daily.    . carvedilol (COREG) 6.25 MG tablet Take 6.25 mg by mouth at bedtime.    . Dapagliflozin Propanediol (FARXIGA) 5 MG TABS Take 1 tablet by mouth daily.    . meclizine (ANTIVERT) 25 MG tablet Take 1 tablet (25 mg total) by mouth 3 (three) times daily as needed for dizziness. 30 tablet 0  . olmesartan-hydrochlorothiazide (BENICAR HCT) 40-25 MG per tablet Take 1 tablet by mouth daily.    Marland Kitchen omeprazole-sodium bicarbonate (ZEGERID) 40-1100 MG per capsule Take 1 capsule by mouth daily before breakfast.    . rosuvastatin (CRESTOR) 10 MG tablet Take 10 mg by mouth every evening.    . sitaGLIPtan-metformin (JANUMET) 50-1000 MG per tablet Take 1 tablet by mouth 2 (two) times daily with a meal.     No current facility-administered medications on file prior to visit.    No Known Allergies  Past Medical History    Diagnosis Date  . Diabetes mellitus   . Hypertension   . High cholesterol   . GERD (gastroesophageal reflux disease)   . Vaginal dryness   . Dizziness     Past Surgical History  Procedure Laterality Date  . Tubal ligation    . Appendectomy    . Breast biopsy      Family History  Problem Relation Age of Onset  . Diabetes Father   . Hypertension Father   . Hyperlipidemia Father   . Stroke Father   . Diabetes Sister   . Hyperlipidemia Sister   . Hypertension Sister   . Lung cancer Paternal Uncle   . Diabetes Paternal Grandmother   . Heart disease Paternal Grandfather     Social History   Social History  . Marital Status: Married    Spouse Name: N/A  . Number of Children: N/A  . Years of Education: N/A   Occupational History  . Not on file.   Social History Main Topics  . Smoking status: Never Smoker   . Smokeless tobacco: Never Used  . Alcohol Use: No  . Drug Use: No  . Sexual Activity: Yes   Other Topics Concern  . Not on file   Social History Narrative   Married.   Has her  own business.      Has two children and two grandchildren.        Review of Systems  Constitutional: Negative.   HENT: Negative.   Eyes: Negative.   Respiratory: Negative.   Cardiovascular: Negative.   Gastrointestinal: Negative.   Endocrine: Negative.   Genitourinary: Negative.   Musculoskeletal: Negative.   Skin: Negative.   Allergic/Immunologic: Negative.   Neurological: Negative.   Hematological: Negative.   Psychiatric/Behavioral: Negative.   All other systems reviewed and are negative.      Objective:    BP 146/82 mmHg  Pulse 79  Temp(Src) 98.1 F (36.7 C) (Oral)  Ht 5' 2.75" (1.594 m)  Wt 159 lb 12 oz (72.462 kg)  BMI 28.52 kg/m2  SpO2 99%   Physical Exam  Constitutional: She is oriented to person, place, and time. She appears well-developed and well-nourished. No distress.  HENT:  Head: Normocephalic.  Eyes: Conjunctivae are normal.  Neck:  Normal range of motion. No thyromegaly present.  Cardiovascular: Normal rate and normal heart sounds.   Pulmonary/Chest: Effort normal and breath sounds normal. No respiratory distress. She has no wheezes.  Musculoskeletal: Normal range of motion. She exhibits no edema.  Neurological: She is alert and oriented to person, place, and time. No cranial nerve deficit.  Skin: Skin is warm and dry. She is not diaphoretic.  Psychiatric: She has a normal mood and affect. Her behavior is normal. Thought content normal.  Nursing note and vitals reviewed.         Assessment & Plan:   Essential hypertension  Gastroesophageal reflux disease, esophagitis presence not specified  Type 2 diabetes mellitus without complication, without long-term current use of insulin (HCC)  HLD (hyperlipidemia) No Follow-up on file.

## 2016-01-13 NOTE — Assessment & Plan Note (Signed)
On vagifem per GYN.

## 2016-01-13 NOTE — Progress Notes (Signed)
Pre visit review using our clinic review tool, if applicable. No additional management support is needed unless otherwise documented below in the visit note. 

## 2016-01-13 NOTE — Assessment & Plan Note (Signed)
Reasonable control on current rxs. Some mild white coat HTN. Managed by cardiology.  No changes made today.

## 2016-01-13 NOTE — Assessment & Plan Note (Signed)
Per pt, has been well controlled on current dose of crestor. Awaiting records.

## 2016-01-13 NOTE — Assessment & Plan Note (Signed)
Well controlled on current rx. No changes made today. 

## 2016-01-13 NOTE — Assessment & Plan Note (Signed)
Reasonable control. a1c UTD- awaiting records for other lab results, urine micro, etc. Per pt, eye exam and immunizations UTD as well. On ARB.

## 2016-03-23 DIAGNOSIS — E119 Type 2 diabetes mellitus without complications: Secondary | ICD-10-CM | POA: Diagnosis not present

## 2016-03-23 DIAGNOSIS — H25813 Combined forms of age-related cataract, bilateral: Secondary | ICD-10-CM | POA: Diagnosis not present

## 2016-03-23 LAB — HM DIABETES EYE EXAM

## 2016-04-05 ENCOUNTER — Encounter: Payer: Self-pay | Admitting: Family Medicine

## 2016-04-13 ENCOUNTER — Encounter: Payer: Self-pay | Admitting: Family Medicine

## 2016-04-15 NOTE — Telephone Encounter (Signed)
Pt recently established care. She is also requesting metformin, which is not on her medication list. See mychart message

## 2016-04-15 NOTE — Telephone Encounter (Signed)
Ok to refill as requested 

## 2016-04-16 NOTE — Telephone Encounter (Signed)
Per Barnett Applebaum, pt to be contacted and scheduled Welcome to Medicare, per chart and medications can be filled until that time. Spoke to pt and advised. States that she found out she has 2 refills remaining on all medications and they do not have be refilled. OV scheduled

## 2016-04-20 ENCOUNTER — Encounter: Payer: Self-pay | Admitting: Family Medicine

## 2016-04-20 ENCOUNTER — Ambulatory Visit (INDEPENDENT_AMBULATORY_CARE_PROVIDER_SITE_OTHER): Payer: Medicare Other | Admitting: Family Medicine

## 2016-04-20 VITALS — BP 122/62 | HR 64 | Temp 97.4°F | Ht 62.75 in | Wt 149.5 lb

## 2016-04-20 DIAGNOSIS — Z1159 Encounter for screening for other viral diseases: Secondary | ICD-10-CM | POA: Diagnosis not present

## 2016-04-20 DIAGNOSIS — E785 Hyperlipidemia, unspecified: Secondary | ICD-10-CM | POA: Diagnosis not present

## 2016-04-20 DIAGNOSIS — I10 Essential (primary) hypertension: Secondary | ICD-10-CM

## 2016-04-20 DIAGNOSIS — K219 Gastro-esophageal reflux disease without esophagitis: Secondary | ICD-10-CM | POA: Diagnosis not present

## 2016-04-20 DIAGNOSIS — Z0000 Encounter for general adult medical examination without abnormal findings: Secondary | ICD-10-CM

## 2016-04-20 DIAGNOSIS — Z23 Encounter for immunization: Secondary | ICD-10-CM

## 2016-04-20 DIAGNOSIS — E119 Type 2 diabetes mellitus without complications: Secondary | ICD-10-CM | POA: Diagnosis not present

## 2016-04-20 LAB — COMPREHENSIVE METABOLIC PANEL
ALT: 23 U/L (ref 0–35)
AST: 20 U/L (ref 0–37)
Albumin: 5 g/dL (ref 3.5–5.2)
Alkaline Phosphatase: 54 U/L (ref 39–117)
BUN: 19 mg/dL (ref 6–23)
CO2: 29 mEq/L (ref 19–32)
Calcium: 10.8 mg/dL — ABNORMAL HIGH (ref 8.4–10.5)
Chloride: 100 mEq/L (ref 96–112)
Creatinine, Ser: 1.05 mg/dL (ref 0.40–1.20)
GFR: 55.74 mL/min — ABNORMAL LOW (ref >60.00)
Glucose, Bld: 102 mg/dL — ABNORMAL HIGH (ref 70–99)
Potassium: 4.3 mEq/L (ref 3.5–5.1)
Sodium: 138 mEq/L (ref 135–145)
Total Bilirubin: 1.7 mg/dL — ABNORMAL HIGH (ref 0.2–1.2)
Total Protein: 7.8 g/dL (ref 6.0–8.3)

## 2016-04-20 LAB — MICROALBUMIN / CREATININE URINE RATIO
Creatinine,U: 27 mg/dL
Microalb Creat Ratio: 2.6 mg/g (ref 0.0–30.0)
Microalb, Ur: <0.7 mg/dL (ref 0.0–1.9)

## 2016-04-20 LAB — TSH: TSH: 0.72 u[IU]/mL (ref 0.35–4.50)

## 2016-04-20 LAB — LIPID PANEL
Cholesterol: 167 mg/dL (ref 0–200)
HDL: 53.4 mg/dL (ref >39.00)
LDL Cholesterol: 78 mg/dL (ref 0–99)
NonHDL: 113.6
Total CHOL/HDL Ratio: 3
Triglycerides: 179 mg/dL — ABNORMAL HIGH (ref 0.0–149.0)
VLDL: 35.8 mg/dL (ref 0.0–40.0)

## 2016-04-20 LAB — HEMOGLOBIN A1C: Hgb A1c MFr Bld: 5.6 % (ref 4.6–6.5)

## 2016-04-20 MED ORDER — ROSUVASTATIN CALCIUM 10 MG PO TABS: 10.0000 mg | ORAL_TABLET | Freq: Every evening | ORAL | Status: DC

## 2016-04-20 MED ORDER — CARVEDILOL 6.25 MG PO TABS: 6.2500 mg | ORAL_TABLET | Freq: Every day | ORAL | Status: DC

## 2016-04-20 MED ORDER — OMEPRAZOLE-SODIUM BICARBONATE 40-1100 MG PO CAPS: 1.0000 | ORAL_CAPSULE | Freq: Every day | ORAL | Status: DC

## 2016-04-20 MED ORDER — AMLODIPINE BESYLATE 5 MG PO TABS: 5.0000 mg | ORAL_TABLET | Freq: Every day | ORAL | Status: DC

## 2016-04-20 MED ORDER — SITAGLIPTIN PHOS-METFORMIN HCL 50-1000 MG PO TABS: 1.0000 | ORAL_TABLET | Freq: Two times a day (BID) | ORAL | Status: DC

## 2016-04-20 MED ORDER — OLMESARTAN MEDOXOMIL-HCTZ 40-25 MG PO TABS: 1.0000 | ORAL_TABLET | Freq: Every day | ORAL | Status: DC

## 2016-04-20 NOTE — Assessment & Plan Note (Addendum)
Improved control with diet and weight loss. Due for labs today. Continue current rxs. Eye exam UTD. On ARB.

## 2016-04-20 NOTE — Assessment & Plan Note (Signed)
Well controlled. No changes made today. 

## 2016-04-20 NOTE — Addendum Note (Signed)
Addended by: Modena Nunnery on: 04/20/2016 11:12 AM   Modules accepted: Orders

## 2016-04-20 NOTE — Assessment & Plan Note (Addendum)
The patients weight, height, BMI and visual acuity have been recorded in the chart.  Cognitive function assessed.   I have made referrals, counseling and provided education to the patient based review of the above and I have provided the pt with a written personalized care plan for preventive services.  EKG NSR.  Prevnar 13 given today.  Orders Placed This Encounter  Procedures  . Hemoglobin A1c  . Comprehensive metabolic panel  . Lipid panel  . TSH  . Hepatitis C Antibody

## 2016-04-20 NOTE — Addendum Note (Signed)
Addended by: Marchia Bond on: 04/20/2016 04:28 PM   Modules accepted: Miquel Dunn

## 2016-04-20 NOTE — Progress Notes (Signed)
Pre visit review using our clinic review tool, if applicable. No additional management support is needed unless otherwise documented below in the visit note. 

## 2016-04-20 NOTE — Progress Notes (Signed)
Subjective:   Patient ID: Brenda Contreras, female    DOB: [birth year], 65 y.o.   MRN: [id number]  Brenda Contreras is a pleasant 65 y.o. year old female who presents to clinic today with Annual Exam and follow up of chronic medical conditions on 04/20/2016  HPI:  Established care with me in 12/2015.  I have personally reviewed the Medicare Annual Wellness questionnaire and have noted 1. The patient's medical and social history 2. Their use of alcohol, tobacco or illicit drugs 3. Their current medications and supplements 4. The patient's functional ability including ADL's, fall risks, home safety risks and hearing or visual             impairment. 5. Diet and physical activities 6. Evidence for depression or mood disorders  End of life wishes discussed and updated in Social History.  The roster of all physicians providing medical care to patient - is listed in the CareTeams section of the chart.  Colonoscopy 12/2013 Mammogram 11/2015 Eye exam 02/2016 Zostavax 11/2015   DM- currently taking Janumet 50-1000 mg - 1 tab by mouth twice daily. Diagnosed approximately 10 years ago.  a1c was 7.2 last month- brings in labs with her today. Checks FSBS twice daily fasting 118- 120s.  Denies any episodes of hypoglycemia.. Eye exam UTD. Has cut back on sugar and lost 12 pounds! Wt Readings from Last 3 Encounters:  04/20/16 149 lb 8 oz (67.813 kg)  01/13/16 159 lb 12 oz (72.462 kg)  04/30/14 157 lb (71.215 kg)    No results found for: HGBA1C   HTN- has been well controlled on Benicar HCT, norvasc 5 mg daily, and coreg 6.25 mg nightly. Dr. Nadyne Coombes is her cardiologist.  Per pt, neg cath 3 or 4 years ago.  Also takes ASA daily.   Lab Results  Component Value Date   CREATININE 0.97 04/30/2014     HLD- takes Crestor 10 mg daily.  Denies myalgias.  No results found for: CHOL, HDL, LDLCALC, LDLDIRECT, TRIG, CHOLHDL  GERD- symptoms have been controlled with Zegerid.  She is on HRT-  vagifem, prescribed by Dr. Julien Girt, GYN.  Last saw her in 10/2015.   Current Outpatient Prescriptions on File Prior to Visit  Medication Sig Dispense Refill  . amLODipine (NORVASC) 5 MG tablet Take 5 mg by mouth daily.    Marland Kitchen aspirin EC 81 MG tablet Take 81 mg by mouth daily.    . carvedilol (COREG) 6.25 MG tablet Take 6.25 mg by mouth at bedtime.    . Dapagliflozin Propanediol (FARXIGA) 5 MG TABS Take 1 tablet by mouth daily.    . Estradiol (VAGIFEM) 10 MCG TABS vaginal tablet Place vaginally.    . meclizine (ANTIVERT) 25 MG tablet Take 1 tablet (25 mg total) by mouth 3 (three) times daily as needed for dizziness. 30 tablet 0  . olmesartan-hydrochlorothiazide (BENICAR HCT) 40-25 MG per tablet Take 1 tablet by mouth daily.    Marland Kitchen omeprazole-sodium bicarbonate (ZEGERID) 40-1100 MG per capsule Take 1 capsule by mouth daily before breakfast.    . rosuvastatin (CRESTOR) 10 MG tablet Take 10 mg by mouth every evening.    . sitaGLIPtan-metformin (JANUMET) 50-1000 MG per tablet Take 1 tablet by mouth 2 (two) times daily with a meal.     No current facility-administered medications on file prior to visit.    No Known Allergies  Past Medical History  Diagnosis Date  . Diabetes mellitus   . Hypertension   . High cholesterol   .  GERD (gastroesophageal reflux disease)   . Vaginal dryness   . Dizziness     Past Surgical History  Procedure Laterality Date  . Tubal ligation    . Appendectomy    . Breast biopsy      Family History  Problem Relation Age of Onset  . Diabetes Father   . Hypertension Father   . Hyperlipidemia Father   . Stroke Father   . Diabetes Sister   . Hyperlipidemia Sister   . Hypertension Sister   . Lung cancer Paternal Uncle   . Diabetes Paternal Grandmother   . Heart disease Paternal Grandfather     Social History   Social History  . Marital Status: Married    Spouse Name: N/A  . Number of Children: N/A  . Years of Education: N/A   Occupational History  .  Not on file.   Social History Main Topics  . Smoking status: Never Smoker   . Smokeless tobacco: Never Used  . Alcohol Use: No  . Drug Use: No  . Sexual Activity: Yes   Other Topics Concern  . 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      Has two children and two grandchildren.      Does not have a living will.  Desires CPR, would not want prolonged life support if futile.        Review of Systems  Constitutional: Negative.   HENT: Negative.   Eyes: Negative.   Respiratory: Negative.   Cardiovascular: Negative.   Gastrointestinal: Negative.   Endocrine: Negative.   Genitourinary: Negative.   Musculoskeletal: Negative.   Skin: Negative.   Allergic/Immunologic: Negative.   Neurological: Negative.   Hematological: Negative.   Psychiatric/Behavioral: Negative.   All other systems reviewed and are negative.      Objective:    BP 122/62 mmHg  Pulse 64  Temp(Src) 97.4 F (36.3 C) (Oral)  Ht 5' 2.75" (1.594 m)  Wt 149 lb 8 oz (67.813 kg)  BMI 26.69 kg/m2  SpO2 98%   Physical Exam  Constitutional: She is oriented to person, place, and time. She appears well-developed and well-nourished. No distress.  HENT:  Head: Normocephalic.  Eyes: Conjunctivae are normal.  Neck: Normal range of motion. No thyromegaly present.  Cardiovascular: Normal rate and normal heart sounds.   Pulmonary/Chest: Effort normal and breath sounds normal. No respiratory distress. She has no wheezes.  Musculoskeletal: Normal range of motion. She exhibits no edema.  Neurological: She is alert and oriented to person, place, and time. No cranial nerve deficit.  Skin: Skin is warm and dry. She is not diaphoretic.  Psychiatric: She has a normal mood and affect. Her behavior is normal. Thought content normal.  Nursing note and vitals reviewed.         Assessment & Plan:   Welcome to Medicare preventive visit - Plan: TSH, EKG 12-Lead  Type 2 diabetes  mellitus without complication, without long-term current use of insulin (HCC) - Plan: Hemoglobin A1c  Gastroesophageal reflux disease, esophagitis presence not specified  HLD (hyperlipidemia) - Plan: Comprehensive metabolic panel, Lipid panel  Essential hypertension  Need for hepatitis C screening test - Plan: Hepatitis C Antibody No Follow-up on file.

## 2016-04-20 NOTE — Patient Instructions (Signed)
Good to see you. We will call you with your results from today.  Happy birthday!

## 2016-04-20 NOTE — Assessment & Plan Note (Signed)
Continue current dose of crestor. Check labs today. 

## 2016-04-21 LAB — HEPATITIS C ANTIBODY: HCV Ab: NEGATIVE

## 2016-04-26 DIAGNOSIS — T148 Other injury of unspecified body region: Secondary | ICD-10-CM | POA: Diagnosis not present

## 2016-04-26 DIAGNOSIS — Z23 Encounter for immunization: Secondary | ICD-10-CM | POA: Diagnosis not present

## 2016-04-27 ENCOUNTER — Ambulatory Visit: Payer: Medicare Other | Admitting: Family Medicine

## 2016-04-29 ENCOUNTER — Encounter: Payer: Self-pay | Admitting: Family Medicine

## 2016-04-30 DIAGNOSIS — S90424A Blister (nonthermal), right lesser toe(s), initial encounter: Secondary | ICD-10-CM | POA: Diagnosis not present

## 2016-04-30 DIAGNOSIS — W57XXXA Bitten or stung by nonvenomous insect and other nonvenomous arthropods, initial encounter: Secondary | ICD-10-CM | POA: Diagnosis not present

## 2016-04-30 DIAGNOSIS — S90861A Insect bite (nonvenomous), right foot, initial encounter: Secondary | ICD-10-CM | POA: Diagnosis not present

## 2016-07-13 ENCOUNTER — Encounter: Payer: Self-pay | Admitting: Family Medicine

## 2016-08-16 ENCOUNTER — Encounter: Payer: Self-pay | Admitting: Family Medicine

## 2016-08-16 MED ORDER — ROSUVASTATIN CALCIUM 10 MG PO TABS: 10.0000 mg | ORAL_TABLET | Freq: Every evening | ORAL | 2 refills | Status: DC

## 2016-08-16 MED ORDER — OLMESARTAN MEDOXOMIL-HCTZ 40-25 MG PO TABS: 1.0000 | ORAL_TABLET | Freq: Every day | ORAL | 2 refills | Status: DC

## 2016-08-16 MED ORDER — CARVEDILOL 6.25 MG PO TABS: 6.2500 mg | ORAL_TABLET | Freq: Every day | ORAL | 2 refills | Status: DC

## 2016-08-16 MED ORDER — SITAGLIPTIN PHOS-METFORMIN HCL 50-1000 MG PO TABS: 1.0000 | ORAL_TABLET | Freq: Two times a day (BID) | ORAL | 2 refills | Status: DC

## 2016-08-19 ENCOUNTER — Encounter: Payer: Self-pay | Admitting: Family Medicine

## 2016-09-23 ENCOUNTER — Encounter: Payer: Self-pay | Admitting: Family Medicine

## 2016-09-23 ENCOUNTER — Telehealth: Payer: Self-pay | Admitting: *Deleted

## 2016-09-23 MED ORDER — AMLODIPINE BESYLATE 5 MG PO TABS: 5.0000 mg | ORAL_TABLET | Freq: Every day | ORAL | 3 refills | Status: DC

## 2016-09-23 MED ORDER — DAPAGLIFLOZIN PROPANEDIOL 5 MG PO TABS: 5.0000 mg | ORAL_TABLET | Freq: Every day | ORAL | 1 refills | Status: DC

## 2016-09-23 NOTE — Telephone Encounter (Signed)
I do see the message.  Is she asking for an order?

## 2016-09-23 NOTE — Telephone Encounter (Signed)
Can you forward this request to Dr Deborra Medina?

## 2016-09-23 NOTE — Telephone Encounter (Signed)
Ms. Dove sent a mychart message asking about getting a Dexa scan.

## 2016-09-23 NOTE — Telephone Encounter (Signed)
She would need an order in order for Robin to schedule her and I did not see an order.

## 2016-09-24 ENCOUNTER — Ambulatory Visit: Payer: Medicare Other

## 2016-09-30 ENCOUNTER — Ambulatory Visit (INDEPENDENT_AMBULATORY_CARE_PROVIDER_SITE_OTHER): Payer: Medicare Other

## 2016-09-30 DIAGNOSIS — Z23 Encounter for immunization: Secondary | ICD-10-CM | POA: Diagnosis not present

## 2016-11-04 ENCOUNTER — Telehealth: Payer: Self-pay | Admitting: *Deleted

## 2016-11-04 NOTE — Telephone Encounter (Signed)
Yes ok to schedule.

## 2016-11-04 NOTE — Telephone Encounter (Signed)
PT wrote into mychart about her Hemoglobin A1c. Am I able to schedule a lab visit for this patient?

## 2016-11-05 ENCOUNTER — Other Ambulatory Visit (INDEPENDENT_AMBULATORY_CARE_PROVIDER_SITE_OTHER): Payer: Medicare Other

## 2016-11-05 DIAGNOSIS — E119 Type 2 diabetes mellitus without complications: Secondary | ICD-10-CM

## 2016-11-05 LAB — HEMOGLOBIN A1C: Hgb A1c MFr Bld: 5.8 % (ref 4.6–6.5)

## 2016-12-23 DIAGNOSIS — J019 Acute sinusitis, unspecified: Secondary | ICD-10-CM | POA: Diagnosis not present

## 2017-01-05 DIAGNOSIS — Z1231 Encounter for screening mammogram for malignant neoplasm of breast: Secondary | ICD-10-CM | POA: Diagnosis not present

## 2017-01-05 DIAGNOSIS — N958 Other specified menopausal and perimenopausal disorders: Secondary | ICD-10-CM | POA: Diagnosis not present

## 2017-01-05 DIAGNOSIS — Z124 Encounter for screening for malignant neoplasm of cervix: Secondary | ICD-10-CM | POA: Diagnosis not present

## 2017-01-05 DIAGNOSIS — Z6826 Body mass index (BMI) 26.0-26.9, adult: Secondary | ICD-10-CM | POA: Diagnosis not present

## 2017-01-28 ENCOUNTER — Encounter: Payer: Self-pay | Admitting: Family Medicine

## 2017-02-01 ENCOUNTER — Encounter: Payer: Self-pay | Admitting: Family Medicine

## 2017-02-02 MED ORDER — CARVEDILOL 6.25 MG PO TABS: 6.2500 mg | ORAL_TABLET | Freq: Every day | ORAL | 0 refills | Status: DC

## 2017-04-11 ENCOUNTER — Encounter: Payer: Self-pay | Admitting: Family Medicine

## 2017-04-11 MED ORDER — DAPAGLIFLOZIN PROPANEDIOL 5 MG PO TABS: 5.0000 mg | ORAL_TABLET | Freq: Every day | ORAL | 0 refills | Status: DC

## 2017-04-11 MED ORDER — OLMESARTAN MEDOXOMIL-HCTZ 40-25 MG PO TABS: 1.0000 | ORAL_TABLET | Freq: Every day | ORAL | 0 refills | Status: DC

## 2017-04-11 MED ORDER — ROSUVASTATIN CALCIUM 10 MG PO TABS: 10.0000 mg | ORAL_TABLET | Freq: Every evening | ORAL | 0 refills | Status: DC

## 2017-04-11 NOTE — Telephone Encounter (Signed)
Last office visit 04/20/2016.  Metformin not on current medication list.  Refill?

## 2017-04-11 NOTE — Telephone Encounter (Signed)
Please call pt- she has not been seen in a year and there is confusion as to what she is taking.  Ok to refill requested rxs but needs to be seen for OV.

## 2017-04-11 NOTE — Addendum Note (Signed)
Addended by: Carter Kitten on: 04/11/2017 09:22 AM   Modules accepted: Orders

## 2017-04-12 MED ORDER — SITAGLIPTIN PHOS-METFORMIN HCL 50-1000 MG PO TABS: 1.0000 | ORAL_TABLET | Freq: Two times a day (BID) | ORAL | 0 refills | Status: DC

## 2017-04-12 NOTE — Addendum Note (Signed)
Addended by: Pilar Grammes on: 04/12/2017 11:56 AM   Modules accepted: Orders

## 2017-04-25 ENCOUNTER — Encounter: Payer: Self-pay | Admitting: Family Medicine

## 2017-04-25 ENCOUNTER — Ambulatory Visit (INDEPENDENT_AMBULATORY_CARE_PROVIDER_SITE_OTHER): Payer: Medicare Other | Admitting: Family Medicine

## 2017-04-25 VITALS — BP 140/86 | HR 74 | Temp 97.6°F | Ht 63.0 in | Wt 155.0 lb

## 2017-04-25 DIAGNOSIS — E119 Type 2 diabetes mellitus without complications: Secondary | ICD-10-CM

## 2017-04-25 DIAGNOSIS — K219 Gastro-esophageal reflux disease without esophagitis: Secondary | ICD-10-CM | POA: Diagnosis not present

## 2017-04-25 DIAGNOSIS — E785 Hyperlipidemia, unspecified: Secondary | ICD-10-CM | POA: Diagnosis not present

## 2017-04-25 DIAGNOSIS — Z23 Encounter for immunization: Secondary | ICD-10-CM | POA: Diagnosis not present

## 2017-04-25 DIAGNOSIS — I10 Essential (primary) hypertension: Secondary | ICD-10-CM

## 2017-04-25 DIAGNOSIS — Z0000 Encounter for general adult medical examination without abnormal findings: Secondary | ICD-10-CM | POA: Diagnosis not present

## 2017-04-25 LAB — CBC WITH DIFFERENTIAL/PLATELET
Basophils Absolute: 0.1 10*3/uL (ref 0.0–0.1)
Basophils Relative: 1 % (ref 0.0–3.0)
Eosinophils Absolute: 0.1 10*3/uL (ref 0.0–0.7)
Eosinophils Relative: 2.1 % (ref 0.0–5.0)
HCT: 39.1 % (ref 36.0–46.0)
Hemoglobin: 12.6 g/dL (ref 12.0–15.0)
Lymphocytes Relative: 32.3 % (ref 12.0–46.0)
Lymphs Abs: 2.3 10*3/uL (ref 0.7–4.0)
MCHC: 32.3 g/dL (ref 30.0–36.0)
MCV: 86.6 fl (ref 78.0–100.0)
Monocytes Absolute: 0.6 10*3/uL (ref 0.1–1.0)
Monocytes Relative: 8.3 % (ref 3.0–12.0)
Neutro Abs: 4 10*3/uL (ref 1.4–7.7)
Neutrophils Relative %: 56.3 % (ref 43.0–77.0)
Platelets: 180 10*3/uL (ref 150.0–400.0)
RBC: 4.51 Mil/uL (ref 3.87–5.11)
RDW: 15 % (ref 11.5–15.5)
WBC: 7.1 10*3/uL (ref 4.0–10.5)

## 2017-04-25 LAB — LIPID PANEL
Cholesterol: 199 mg/dL (ref 0–200)
HDL: 59.2 mg/dL (ref >39.00)
LDL Cholesterol: 115 mg/dL — ABNORMAL HIGH (ref 0–99)
NonHDL: 139.58
Total CHOL/HDL Ratio: 3
Triglycerides: 123 mg/dL (ref 0.0–149.0)
VLDL: 24.6 mg/dL (ref 0.0–40.0)

## 2017-04-25 LAB — COMPREHENSIVE METABOLIC PANEL
ALT: 19 U/L (ref 0–35)
AST: 17 U/L (ref 0–37)
Albumin: 4.4 g/dL (ref 3.5–5.2)
Alkaline Phosphatase: 64 U/L (ref 39–117)
BUN: 19 mg/dL (ref 6–23)
CO2: 29 mEq/L (ref 19–32)
Calcium: 10 mg/dL (ref 8.4–10.5)
Chloride: 102 mEq/L (ref 96–112)
Creatinine, Ser: 0.97 mg/dL (ref 0.40–1.20)
GFR: 60.89 mL/min (ref >60.00)
Glucose, Bld: 124 mg/dL — ABNORMAL HIGH (ref 70–99)
Potassium: 4.3 mEq/L (ref 3.5–5.1)
Sodium: 140 mEq/L (ref 135–145)
Total Bilirubin: 0.8 mg/dL (ref 0.2–1.2)
Total Protein: 7.1 g/dL (ref 6.0–8.3)

## 2017-04-25 LAB — TSH: TSH: 1.18 u[IU]/mL (ref 0.35–4.50)

## 2017-04-25 LAB — HEMOGLOBIN A1C: Hgb A1c MFr Bld: 6.2 % (ref 4.6–6.5)

## 2017-04-25 MED ORDER — CARVEDILOL 6.25 MG PO TABS: 6.2500 mg | ORAL_TABLET | Freq: Every day | ORAL | 0 refills | Status: DC

## 2017-04-25 NOTE — Progress Notes (Signed)
Subjective:   Patient ID: Brenda Contreras, female    DOB: [birth year], 66 y.o.   MRN: [id number]  ASTER SCREWS is a pleasant 66 y.o. year old female who presents to clinic today with Medicare Wellness; Diabetes; and Hypertension (Will need a 30 day script for carvedilol to take to local pharmacy) and follow up of chronic medical conditions on 04/25/2017  HPI:   I have personally reviewed the Medicare Annual Wellness questionnaire and have noted 1. The patient's medical and social history 2. Their use of alcohol, tobacco or illicit drugs 3. Their current medications and supplements 4. The patient's functional ability including ADL's, fall risks, home safety risks and hearing or visual             impairment. 5. Diet and physical activities 6. Evidence for depression or mood disorders  End of life wishes discussed and updated in Social History.  The roster of all physicians providing medical care to patient - is listed in the CareTeams section of the chart.  Colonoscopy 12/2013 Mammogram, DEXA and pap with OBGYN 12/2016 Eye exam 02/2016 Zostavax 11/2015 Prevnar 13 04/20/16   DM- currently taking Janumet 50-1000 mg - 1 tab by mouth twice daily. FSBS have deteriorated. Checks FSBS twice daily fasting 130s- 150s.  Denies any episodes of hypoglycemia..  Has cut back on sugar and lost 12 pounds! Wt Readings from Last 3 Encounters:  04/25/17 155 lb (70.3 kg)  04/20/16 149 lb 8 oz (67.8 kg)  01/13/16 159 lb 12 oz (72.5 kg)    Lab Results  Component Value Date   HGBA1C 5.8 11/05/2016     HTN- has been well controlled on Benicar HCT, norvasc 5 mg daily, and coreg 6.25 mg nightly. Dr. Nadyne Coombes is her cardiologist.   Lab Results  Component Value Date   CREATININE 1.05 04/20/2016     HLD- takes Crestor 10 mg daily.  Denies myalgias.  Lab Results  Component Value Date   CHOL 167 04/20/2016   HDL 53.40 04/20/2016   LDLCALC 78 04/20/2016   TRIG 179.0 (H) 04/20/2016   CHOLHDL  3 04/20/2016    GERD- symptoms have been controlled with Zegerid.  She is on HRT- vagifem, prescribed by Dr. Julien Girt, GYN.     Current Outpatient Prescriptions on File Prior to Visit  Medication Sig Dispense Refill  . amLODipine (NORVASC) 5 MG tablet Take 1 tablet (5 mg total) by mouth daily. 90 tablet 3  . aspirin EC 81 MG tablet Take 81 mg by mouth daily.    . carvedilol (COREG) 6.25 MG tablet Take 1 tablet (6.25 mg total) by mouth at bedtime. 90 tablet 0  . dapagliflozin propanediol (FARXIGA) 5 MG TABS tablet Take 5 mg by mouth daily. 90 tablet 0  . Estradiol (VAGIFEM) 10 MCG TABS vaginal tablet Place vaginally.    Marland Kitchen olmesartan-hydrochlorothiazide (BENICAR HCT) 40-25 MG tablet Take 1 tablet by mouth daily. 90 tablet 0  . omeprazole-sodium bicarbonate (ZEGERID) 40-1100 MG capsule Take 1 capsule by mouth daily before breakfast. 90 capsule 3  . rosuvastatin (CRESTOR) 10 MG tablet Take 1 tablet (10 mg total) by mouth every evening. 90 tablet 0  . sitaGLIPtin-metformin (JANUMET) 50-1000 MG tablet Take 1 tablet by mouth 2 (two) times daily with a meal. 180 tablet 0   No current facility-administered medications on file prior to visit.     No Known Allergies  Past Medical History:  Diagnosis Date  . Diabetes mellitus   . Dizziness   .  GERD (gastroesophageal reflux disease)   . High cholesterol   . Hypertension   . Vaginal dryness     Past Surgical History:  Procedure Laterality Date  . APPENDECTOMY    . BREAST BIOPSY    . TUBAL LIGATION      Family History  Problem Relation Age of Onset  . Diabetes Father   . Hypertension Father   . Hyperlipidemia Father   . Stroke Father   . Diabetes Sister   . Hyperlipidemia Sister   . Hypertension Sister   . Lung cancer Paternal Uncle   . Diabetes Paternal Grandmother   . Heart disease Paternal Grandfather     Social History   Social History  . Marital status: Married    Spouse name: N/A  . Number of children: N/A  . Years  of education: N/A   Occupational History  . Not on file.   Social History Main Topics  . Smoking status: Never Smoker  . Smokeless tobacco: Never Used  . Alcohol use No  . Drug use: No  . Sexual activity: Yes   Other Topics Concern  . 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      Has two children and two grandchildren.      Does not have a living will.  Desires CPR, would not want prolonged life support if futile.        Review of Systems  Constitutional: Negative.   HENT: Negative.   Eyes: Negative.   Respiratory: Negative.   Cardiovascular: Negative.   Gastrointestinal: Negative.   Endocrine: Negative.   Genitourinary: Negative.   Musculoskeletal: Negative.   Skin: Negative.   Allergic/Immunologic: Negative.   Neurological: Negative.   Hematological: Negative.   Psychiatric/Behavioral: Negative.   All other systems reviewed and are negative.      Objective:    BP 140/86 (BP Location: Left Arm, Patient Position: Sitting, Cuff Size: Normal)   Pulse 74   Temp 97.6 F (36.4 C) (Oral)   Ht 5\' 3"  (1.6 m)   Wt 155 lb (70.3 kg)   SpO2 98%   BMI 27.46 kg/m    Physical Exam  Constitutional: She is oriented to person, place, and time. She appears well-developed and well-nourished. No distress.  HENT:  Head: Normocephalic.  Eyes: Conjunctivae are normal.  Neck: Normal range of motion. No thyromegaly present.  Cardiovascular: Normal rate and normal heart sounds.   Pulmonary/Chest: Effort normal and breath sounds normal. No respiratory distress. She has no wheezes.  Musculoskeletal: Normal range of motion. She exhibits no edema.  Neurological: She is alert and oriented to person, place, and time. No cranial nerve deficit.  Skin: Skin is warm and dry. She is not diaphoretic.  Psychiatric: She has a normal mood and affect. Her behavior is normal. Thought content normal.  Nursing note and vitals reviewed.           Assessment & Plan:   Hyperlipidemia, unspecified hyperlipidemia type  Medicare annual wellness visit, initial  Essential hypertension  Type 2 diabetes mellitus without complication, without long-term current use of insulin (HCC)  Gastroesophageal reflux disease, esophagitis presence not specified No Follow-up on file.

## 2017-04-25 NOTE — Assessment & Plan Note (Signed)
Deteriorated. No adjustment made to rxs today. Check labs first for more information. The patient indicates understanding of these issues and agrees with the plan.

## 2017-04-25 NOTE — Assessment & Plan Note (Signed)
Well controlled. No changes made to rxs today. 

## 2017-04-25 NOTE — Assessment & Plan Note (Signed)
Continue crestor. Check labs today. 

## 2017-04-25 NOTE — Progress Notes (Signed)
Pre visit review using our clinic review tool, if applicable. No additional management support is needed unless otherwise documented below in the visit note. 

## 2017-04-25 NOTE — Assessment & Plan Note (Signed)
The patients weight, height, BMI and visual acuity have been recorded in the chart.  Cognitive function assessed.   I have made referrals, counseling and provided education to the patient based review of the above and I have provided the pt with a written personalized care plan for preventive services.  Pneumovax given today.

## 2017-04-25 NOTE — Patient Instructions (Signed)
Great to see you.  We will call you with your results and you can view them online. 

## 2017-04-25 NOTE — Addendum Note (Signed)
Addended by: Pilar Grammes on: 04/25/2017 09:17 AM   Modules accepted: Orders

## 2017-04-27 ENCOUNTER — Telehealth: Payer: Self-pay | Admitting: Family Medicine

## 2017-04-27 NOTE — Telephone Encounter (Signed)
Yes we can follow up her chronic medical conditions as well at that [facility].

## 2017-04-27 NOTE — Telephone Encounter (Signed)
Pt put in appt request on MyChart for foot exam. When I called the pt, she said MyChart told her it was time to set it up. I went ahead and set appt for 30 min. Is this an appt she needs?

## 2017-05-03 ENCOUNTER — Ambulatory Visit: Admitting: Family Medicine

## 2017-05-05 ENCOUNTER — Encounter: Payer: Self-pay | Admitting: Family Medicine

## 2017-05-05 ENCOUNTER — Ambulatory Visit (INDEPENDENT_AMBULATORY_CARE_PROVIDER_SITE_OTHER): Payer: Medicare Other | Admitting: Family Medicine

## 2017-05-05 DIAGNOSIS — E119 Type 2 diabetes mellitus without complications: Secondary | ICD-10-CM

## 2017-05-05 NOTE — Progress Notes (Signed)
Subjective:   Patient ID: Brenda Contreras, female    DOB: December 09, 1951, 66 y.o.   MRN: [id number]  Brenda Contreras is a pleasant 66 y.o. year old female who presents to clinic today with Diabetic Foot Exam  on 05/05/2017  HPI:  DM- currently taking Janumet 50-1000 mg - 1 tab by mouth twice daily. FSBS have deteriorated. Checks FSBS twice daily fasting 130s- 150s.  Denies any episodes of hypoglycemia.  Keeps her toe nails trimmed.  Denies any numbness or pain in her feet.  Wt Readings from Last 3 Encounters:  05/05/17 154 lb (69.9 kg)  04/25/17 155 lb (70.3 kg)  04/20/16 149 lb 8 oz (67.8 kg)     Current Outpatient Prescriptions on File Prior to Visit  Medication Sig Dispense Refill  . amLODipine (NORVASC) 5 MG tablet Take 1 tablet (5 mg total) by mouth daily. 90 tablet 3  . aspirin EC 81 MG tablet Take 81 mg by mouth daily.    . carvedilol (COREG) 6.25 MG tablet Take 1 tablet (6.25 mg total) by mouth at bedtime. 30 tablet 0  . dapagliflozin propanediol (FARXIGA) 5 MG TABS tablet Take 5 mg by mouth daily. 90 tablet 0  . Estradiol (VAGIFEM) 10 MCG TABS vaginal tablet Place vaginally.    Marland Kitchen olmesartan-hydrochlorothiazide (BENICAR HCT) 40-25 MG tablet Take 1 tablet by mouth daily. 90 tablet 0  . omeprazole-sodium bicarbonate (ZEGERID) 40-1100 MG capsule Take 1 capsule by mouth daily before breakfast. 90 capsule 3  . rosuvastatin (CRESTOR) 10 MG tablet Take 1 tablet (10 mg total) by mouth every evening. 90 tablet 0  . sitaGLIPtin-metformin (JANUMET) 50-1000 MG tablet Take 1 tablet by mouth 2 (two) times daily with a meal. 180 tablet 0   No current facility-administered medications on file prior to visit.     No Known Allergies  Past Medical History:  Diagnosis Date  . Diabetes mellitus   . Dizziness   . GERD (gastroesophageal reflux disease)   . High cholesterol   . Hypertension   . Vaginal dryness     Past Surgical History:  Procedure Laterality Date  . APPENDECTOMY      . BREAST BIOPSY    . TUBAL LIGATION      Family History  Problem Relation Age of Onset  . Diabetes Father   . Hypertension Father   . Hyperlipidemia Father   . Stroke Father   . Diabetes Sister   . Hyperlipidemia Sister   . Hypertension Sister   . Lung cancer Paternal Uncle   . Diabetes Paternal Grandmother   . Heart disease Paternal Grandfather     Social History   Social History  . Marital status: Married    Spouse name: N/A  . Number of children: N/A  . Years of education: N/A   Occupational History  . Not on file.   Social History Main Topics  . Smoking status: Never Smoker  . Smokeless tobacco: Never Used  . Alcohol use No  . Drug use: No  . Sexual activity: Yes   Other Topics Concern  . 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      Has two children and two grandchildren.      Does not have a living will.  Desires CPR, would not want prolonged life support if futile.   The PMH, PSH, Social History, Family History, Medications, and allergies have been reviewed in [facility], and have been  updated if relevant.   Review of Systems  Musculoskeletal: Negative.   Neurological: Negative.   All other systems reviewed and are negative.      Objective:    BP 124/76 (BP Location: Left Arm, Patient Position: Sitting, Cuff Size: Normal)   Pulse 76   Temp 98.1 F (36.7 C) (Oral)   Wt 154 lb (69.9 kg)   SpO2 98%   BMI 27.28 kg/m    Physical Exam  Constitutional: She is oriented to person, place, and time. She appears well-developed and well-nourished. No distress.  HENT:  Head: Normocephalic and atraumatic.  Eyes: Conjunctivae are normal.  Cardiovascular: Normal rate.   Pulmonary/Chest: Effort normal.  Musculoskeletal:  See foot exam under quality metrics  Neurological: She is alert and oriented to person, place, and time. No cranial nerve deficit.  Skin: Skin is warm and dry. She is not diaphoretic.  Psychiatric: She  has a normal mood and affect. Her behavior is normal. Judgment and thought content normal.  Nursing note and vitals reviewed.         Assessment & Plan:   Encounter for diabetic foot exam ([place]) No Follow-up on file.

## 2017-05-05 NOTE — Progress Notes (Signed)
Pre visit review using our clinic review tool, if applicable. No additional management support is needed unless otherwise documented below in the visit note. 

## 2017-05-05 NOTE — Assessment & Plan Note (Signed)
See foot exam under quality metrics

## 2017-05-17 ENCOUNTER — Encounter: Payer: Self-pay | Admitting: Family Medicine

## 2017-05-17 ENCOUNTER — Other Ambulatory Visit: Payer: Self-pay | Admitting: Family Medicine

## 2017-05-17 MED ORDER — DAPAGLIFLOZIN PROPANEDIOL 5 MG PO TABS: 5.0000 mg | ORAL_TABLET | Freq: Every day | ORAL | 3 refills | Status: DC

## 2017-05-17 MED ORDER — OMEPRAZOLE-SODIUM BICARBONATE 40-1100 MG PO CAPS: 1.0000 | ORAL_CAPSULE | Freq: Every day | ORAL | 3 refills | Status: DC

## 2017-05-17 MED ORDER — ROSUVASTATIN CALCIUM 10 MG PO TABS: 10.0000 mg | ORAL_TABLET | Freq: Every evening | ORAL | 3 refills | Status: DC

## 2017-05-17 MED ORDER — CARVEDILOL 6.25 MG PO TABS: 6.2500 mg | ORAL_TABLET | Freq: Every day | ORAL | 3 refills | Status: DC

## 2017-05-24 DIAGNOSIS — H5703 Miosis: Secondary | ICD-10-CM | POA: Diagnosis not present

## 2017-05-24 DIAGNOSIS — H25813 Combined forms of age-related cataract, bilateral: Secondary | ICD-10-CM | POA: Diagnosis not present

## 2017-05-24 DIAGNOSIS — E119 Type 2 diabetes mellitus without complications: Secondary | ICD-10-CM | POA: Diagnosis not present

## 2017-09-05 ENCOUNTER — Encounter: Payer: Self-pay | Admitting: Family Medicine

## 2017-09-05 ENCOUNTER — Other Ambulatory Visit: Payer: Self-pay | Admitting: Family Medicine

## 2017-09-06 MED ORDER — CARVEDILOL 6.25 MG PO TABS: 6.2500 mg | ORAL_TABLET | Freq: Every day | ORAL | 0 refills | Status: DC

## 2017-09-06 MED ORDER — OLMESARTAN MEDOXOMIL-HCTZ 40-25 MG PO TABS: 1.0000 | ORAL_TABLET | Freq: Every day | ORAL | 0 refills | Status: DC

## 2017-09-06 MED ORDER — DAPAGLIFLOZIN PROPANEDIOL 5 MG PO TABS: 5.0000 mg | ORAL_TABLET | Freq: Every day | ORAL | 0 refills | Status: DC

## 2017-09-06 MED ORDER — SITAGLIPTIN PHOS-METFORMIN HCL 50-1000 MG PO TABS: 1.0000 | ORAL_TABLET | Freq: Two times a day (BID) | ORAL | 0 refills | Status: DC

## 2017-09-06 NOTE — Addendum Note (Signed)
Addended by: Mady Haagensen on: 09/06/2017 11:31 AM   Modules accepted: Orders

## 2017-09-07 MED ORDER — AMLODIPINE BESYLATE 5 MG PO TABS: 5.0000 mg | ORAL_TABLET | Freq: Every day | ORAL | 0 refills | Status: DC

## 2017-09-12 ENCOUNTER — Encounter: Payer: Self-pay | Admitting: Family Medicine

## 2017-09-14 ENCOUNTER — Encounter: Payer: Self-pay | Admitting: Family Medicine

## 2017-09-15 ENCOUNTER — Ambulatory Visit (INDEPENDENT_AMBULATORY_CARE_PROVIDER_SITE_OTHER): Payer: Medicare Other

## 2017-09-15 DIAGNOSIS — Z23 Encounter for immunization: Secondary | ICD-10-CM

## 2017-09-15 MED ORDER — AMLODIPINE BESYLATE 5 MG PO TABS: 5.0000 mg | ORAL_TABLET | Freq: Every day | ORAL | 0 refills | Status: DC

## 2017-11-10 ENCOUNTER — Encounter: Payer: Self-pay | Admitting: Primary Care

## 2017-11-10 ENCOUNTER — Ambulatory Visit (INDEPENDENT_AMBULATORY_CARE_PROVIDER_SITE_OTHER): Payer: Medicare Other | Admitting: Primary Care

## 2017-11-10 DIAGNOSIS — E785 Hyperlipidemia, unspecified: Secondary | ICD-10-CM

## 2017-11-10 DIAGNOSIS — I10 Essential (primary) hypertension: Secondary | ICD-10-CM

## 2017-11-10 DIAGNOSIS — E119 Type 2 diabetes mellitus without complications: Secondary | ICD-10-CM | POA: Diagnosis not present

## 2017-11-10 DIAGNOSIS — K219 Gastro-esophageal reflux disease without esophagitis: Secondary | ICD-10-CM | POA: Diagnosis not present

## 2017-11-10 NOTE — Assessment & Plan Note (Signed)
Stable on Zegerid, continue same.

## 2017-11-10 NOTE — Assessment & Plan Note (Signed)
LDL slightly above goal in April 2018, repeat lipids pending. Recommended regular exercise and improvement in diet.

## 2017-11-10 NOTE — Assessment & Plan Note (Signed)
Stable in the office today. Continue current regimen. BMP reviewed from April 2018.

## 2017-11-10 NOTE — Progress Notes (Signed)
Subjective:    Patient ID: Brenda Contreras, female    DOB: 1951-11-12, 65 y.o.   MRN: [id number]  HPI  Brenda Contreras is a 66 year old female who presents today to transfer care from Dr. Deborra Medina.  1) Type 2 Diabetes: Currently managed on Farxiga 5 mg and sitagliptin-metformin 50-1000 mg BID.   She is checking her blood glucose two times daily and is getting readings of: AM fasting: 150-170 PM before dinner: 110-250  Last A1C: 6.2 in April 2018 Last Eye Exam: Completed in May 2018 Last Foot Exam: Completed in May 2018 Pneumonia Vaccination: UTD. ACE/ARB: ARB Statin: Statin  Diet currently consists of:  Breakfast: Cereal, cheese Lunch: Sandwich Dinner: Restaurants (Fried seafood, baked and fried meats, some pizza), fast food. Snacks: None Desserts: Cookies daily Beverages: Coffee, water  Exercise: She does currently exercise.    2) Essential Hypertension: Currently managed on carvedilol 6.25 mg once daily, olmesartan-HCTZ 40-25 mg, Amlodipine 5 mg. She denies chest pain, shortness of breath.   BP Readings from Last 3 Encounters:  11/10/17 130/76  05/05/17 124/76  04/25/17 140/86     3) Hyperlipidemia: Currently managed on rosuvastatin 10 mg. Last lipid panel with LDL of 115, TC of 199.  4) GERD: Currently managed on omeprazole-sodium bicarbonate 40-1100 mg. Without her medication she'll experience chest and esophageal burning.    Review of Systems  Constitutional: Negative for fatigue.  Respiratory: Negative for shortness of breath.   Cardiovascular: Negative for chest pain and leg swelling.  Neurological: Negative for dizziness, numbness and headaches.       Past Medical History:  Diagnosis Date  . Diabetes mellitus   . Dizziness   . GERD (gastroesophageal reflux disease)   . High cholesterol   . Hypertension   . Vaginal dryness      Social History   Socioeconomic History  . Marital status: Married    Spouse name: Not on file  . Number of children:  Not on file  . Years of education: Not on file  . Highest education level: Not on file  Social Needs  . Financial resource strain: Not on file  . Food insecurity - worry: Not on file  . Food insecurity - inability: Not on file  . Transportation needs - medical: Not on file  . Transportation needs - non-medical: Not on file  Occupational History  . Not on file  Tobacco Use  . Smoking status: Never Smoker  . Smokeless tobacco: Never Used  Substance and Sexual Activity  . Alcohol use: No  . Drug use: No  . Sexual activity: Yes  Other Topics Concern  . 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      Has two children and two grandchildren.      Does not have a living will.  Desires CPR, would not want prolonged life support if futile.    Past Surgical History:  Procedure Laterality Date  . APPENDECTOMY    . BREAST BIOPSY    . TUBAL LIGATION      Family History  Problem Relation Age of Onset  . Diabetes Father   . Hypertension Father   . Hyperlipidemia Father   . Stroke Father   . Diabetes Sister   . Hyperlipidemia Sister   . Hypertension Sister   . Lung cancer Paternal Uncle   . Diabetes Paternal Grandmother   . Heart disease Paternal Grandfather     No Known  Allergies  Current Outpatient Medications on File Prior to Visit  Medication Sig Dispense Refill  . amLODipine (NORVASC) 5 MG tablet Take 1 tablet (5 mg total) by mouth daily. 90 tablet 0  . aspirin EC 81 MG tablet Take 81 mg by mouth daily.    . carvedilol (COREG) 6.25 MG tablet Take 1 tablet (6.25 mg total) by mouth at bedtime. 90 tablet 0  . dapagliflozin propanediol (FARXIGA) 5 MG TABS tablet Take 5 mg by mouth daily. 90 tablet 0  . olmesartan-hydrochlorothiazide (BENICAR HCT) 40-25 MG tablet Take 1 tablet by mouth daily. 90 tablet 0  . omeprazole-sodium bicarbonate (ZEGERID) 40-1100 MG capsule Take 1 capsule by mouth daily before breakfast. 90 capsule 3  . rosuvastatin  (CRESTOR) 10 MG tablet Take 1 tablet (10 mg total) by mouth every evening. 90 tablet 3  . sitaGLIPtin-metformin (JANUMET) 50-1000 MG tablet Take 1 tablet by mouth 2 (two) times daily with a meal. 180 tablet 0   No current facility-administered medications on file prior to visit.     BP 130/76   Pulse 78   Temp 97.8 F (36.6 C) (Oral)   Ht 5' 2.75" (1.594 m)   Wt 159 lb 12.8 oz (72.5 kg)   SpO2 97%   BMI 28.53 kg/m    Objective:   Physical Exam  Constitutional: She appears well-nourished.  Neck: Neck supple.  Cardiovascular: Normal rate and regular rhythm.  Pulmonary/Chest: Effort normal and breath sounds normal.  Skin: Skin is warm and dry.  Psychiatric: She has a normal mood and affect.          Assessment & Plan:

## 2017-11-10 NOTE — Patient Instructions (Signed)
Start exercising. You should be getting 150 minutes of moderate intensity exercise weekly.  Work on improvements in [employer] including limiting sweets and junk food. Decrease fried/greasy food. Increase vegetables, fruit, whole grains.  Ensure you are consuming 64 ounces of water daily.  Please schedule a physical with me in 6 months. You may also schedule a lab only appointment 3-4 days prior. We will discuss your lab results in detail during your physical.  It was a pleasure meeting you!

## 2017-11-10 NOTE — Assessment & Plan Note (Signed)
Due for repeat A1C, pending. Discussed to work on diet and to start regular exercise. Foot and eye exam UTD. Managed on ARB and statin. LDL slightly above goal in April 2018, repeat lipids pending. Pneumonia vaccinations UTD.

## 2017-11-16 ENCOUNTER — Other Ambulatory Visit: Payer: Medicare Other

## 2017-11-21 ENCOUNTER — Other Ambulatory Visit (INDEPENDENT_AMBULATORY_CARE_PROVIDER_SITE_OTHER): Payer: Medicare Other

## 2017-11-21 DIAGNOSIS — E785 Hyperlipidemia, unspecified: Secondary | ICD-10-CM | POA: Diagnosis not present

## 2017-11-21 DIAGNOSIS — E119 Type 2 diabetes mellitus without complications: Secondary | ICD-10-CM

## 2017-11-21 LAB — HEMOGLOBIN A1C: Hgb A1c MFr Bld: 6.5 % (ref 4.6–6.5)

## 2017-11-21 LAB — LIPID PANEL
Cholesterol: 188 mg/dL (ref 0–200)
HDL: 47.2 mg/dL (ref >39.00)
NonHDL: 140.56
Total CHOL/HDL Ratio: 4
Triglycerides: 221 mg/dL — ABNORMAL HIGH (ref 0.0–149.0)
VLDL: 44.2 mg/dL — ABNORMAL HIGH (ref 0.0–40.0)

## 2017-11-21 LAB — LDL CHOLESTEROL, DIRECT: Direct LDL: 111 mg/dL

## 2017-11-22 ENCOUNTER — Other Ambulatory Visit: Payer: Self-pay | Admitting: Primary Care

## 2017-11-22 DIAGNOSIS — E785 Hyperlipidemia, unspecified: Secondary | ICD-10-CM

## 2017-11-22 MED ORDER — ROSUVASTATIN CALCIUM 20 MG PO TABS: 20.0000 mg | ORAL_TABLET | Freq: Every day | ORAL | 0 refills | Status: DC

## 2017-12-12 ENCOUNTER — Other Ambulatory Visit: Payer: Self-pay | Admitting: Family Medicine

## 2017-12-12 ENCOUNTER — Encounter: Payer: Self-pay | Admitting: Primary Care

## 2017-12-12 DIAGNOSIS — E785 Hyperlipidemia, unspecified: Secondary | ICD-10-CM

## 2017-12-12 MED ORDER — SITAGLIPTIN PHOS-METFORMIN HCL 50-1000 MG PO TABS: 1.0000 | ORAL_TABLET | Freq: Two times a day (BID) | ORAL | 0 refills | Status: DC

## 2017-12-12 MED ORDER — AMLODIPINE BESYLATE 5 MG PO TABS: 5.0000 mg | ORAL_TABLET | Freq: Every day | ORAL | 0 refills | Status: DC

## 2017-12-12 MED ORDER — ROSUVASTATIN CALCIUM 20 MG PO TABS: 20.0000 mg | ORAL_TABLET | Freq: Every day | ORAL | 0 refills | Status: DC

## 2017-12-12 MED ORDER — OLMESARTAN MEDOXOMIL-HCTZ 40-25 MG PO TABS: 1.0000 | ORAL_TABLET | Freq: Every day | ORAL | 0 refills | Status: DC

## 2017-12-12 MED ORDER — DAPAGLIFLOZIN PROPANEDIOL 5 MG PO TABS: 5.0000 mg | ORAL_TABLET | Freq: Every day | ORAL | 0 refills | Status: DC

## 2017-12-12 MED ORDER — CARVEDILOL 6.25 MG PO TABS
6.2500 mg | ORAL_TABLET | Freq: Every day | ORAL | 0 refills | Status: DC
Start: 2017-12-12 — End: 2018-03-02

## 2017-12-12 NOTE — Telephone Encounter (Signed)
Ok to refill? Electronically refill request   Patient transferred care to [facility] on 11/10/2017.

## 2018-01-05 ENCOUNTER — Other Ambulatory Visit: Payer: Self-pay | Admitting: Primary Care

## 2018-01-05 DIAGNOSIS — E785 Hyperlipidemia, unspecified: Secondary | ICD-10-CM

## 2018-01-10 ENCOUNTER — Other Ambulatory Visit (INDEPENDENT_AMBULATORY_CARE_PROVIDER_SITE_OTHER): Payer: Medicare Other

## 2018-01-10 DIAGNOSIS — E785 Hyperlipidemia, unspecified: Secondary | ICD-10-CM | POA: Diagnosis not present

## 2018-01-10 LAB — LIPID PANEL
Cholesterol: 170 mg/dL (ref 0–200)
HDL: 49.7 mg/dL (ref >39.00)
LDL Cholesterol: 83 mg/dL (ref 0–99)
NonHDL: 119.97
Total CHOL/HDL Ratio: 3
Triglycerides: 184 mg/dL — ABNORMAL HIGH (ref 0.0–149.0)
VLDL: 36.8 mg/dL (ref 0.0–40.0)

## 2018-01-27 ENCOUNTER — Encounter: Payer: Self-pay | Admitting: Primary Care

## 2018-01-27 ENCOUNTER — Ambulatory Visit (INDEPENDENT_AMBULATORY_CARE_PROVIDER_SITE_OTHER): Payer: Medicare Other | Admitting: Primary Care

## 2018-01-27 VITALS — BP 140/70 | HR 82 | Temp 98.1°F | Ht 62.75 in | Wt 155.8 lb

## 2018-01-27 DIAGNOSIS — J069 Acute upper respiratory infection, unspecified: Secondary | ICD-10-CM

## 2018-01-27 NOTE — Progress Notes (Signed)
Subjective:    Patient ID: Brenda Contreras, female    DOB: 1951/12/06, 67 y.o.   MRN: [id number]  HPI  Brenda Contreras is a 67 year old female with a history of hypertension, GERD, diabetes who presents today with a chief complaint of cough. She also reports chills, dizziness, body aches.   Her symptoms began yesterday with chills and body aches. She had her flu shot this year. She denies recent flu exposure. Her cough is non productive but she does feel like she has chest congestion. She's not taken anything OTC for her symptoms. She denies fevers, palpitations, sore throat.    Review of Systems  Constitutional: Positive for chills. Negative for fever.  HENT: Positive for congestion and sinus pressure. Negative for sore throat.   Respiratory: Positive for cough.   Musculoskeletal: Positive for myalgias.       Past Medical History:  Diagnosis Date  . Diabetes mellitus   . Dizziness   . GERD (gastroesophageal reflux disease)   . High cholesterol   . Hypertension   . Vaginal dryness      Social History   Socioeconomic History  . Marital status: Married    Spouse name: Not on file  . Number of children: Not on file  . Years of education: Not on file  . Highest education level: Not on file  Social Needs  . Financial resource strain: Not on file  . Food insecurity - worry: Not on file  . Food insecurity - inability: Not on file  . Transportation needs - medical: Not on file  . Transportation needs - non-medical: Not on file  Occupational History  . Not on file  Tobacco Use  . Smoking status: Never Smoker  . Smokeless tobacco: Never Used  Substance and Sexual Activity  . Alcohol use: No  . Drug use: No  . Sexual activity: Yes  Other Topics Concern  . 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      Has two children and two grandchildren.      Does not have a living will.  Desires CPR, would not want prolonged life support if futile.     Past Surgical History:  Procedure Laterality Date  . APPENDECTOMY    . BREAST BIOPSY    . TUBAL LIGATION      Family History  Problem Relation Age of Onset  . Diabetes Father   . Hypertension Father   . Hyperlipidemia Father   . Stroke Father   . Diabetes Sister   . Hyperlipidemia Sister   . Hypertension Sister   . Lung cancer Paternal Uncle   . Diabetes Paternal Grandmother   . Heart disease Paternal Grandfather     No Known Allergies  Current Outpatient Medications on File Prior to Visit  Medication Sig Dispense Refill  . amLODipine (NORVASC) 5 MG tablet Take 1 tablet (5 mg total) by mouth daily. 90 tablet 0  . aspirin EC 81 MG tablet Take 81 mg by mouth daily.    . carvedilol (COREG) 6.25 MG tablet Take 1 tablet (6.25 mg total) by mouth at bedtime. 90 tablet 0  . dapagliflozin propanediol (FARXIGA) 5 MG TABS tablet Take 5 mg by mouth daily. 90 tablet 0  . olmesartan-hydrochlorothiazide (BENICAR HCT) 40-25 MG tablet Take 1 tablet by mouth daily. 90 tablet 0  . omeprazole-sodium bicarbonate (ZEGERID) 40-1100 MG capsule Take 1 capsule by mouth daily before breakfast. 90 capsule  3  . rosuvastatin (CRESTOR) 20 MG tablet Take 1 tablet (20 mg total) by mouth at bedtime. 90 tablet 0  . sitaGLIPtin-metformin (JANUMET) 50-1000 MG tablet Take 1 tablet by mouth 2 (two) times daily with a meal. 180 tablet 0   No current facility-administered medications on file prior to visit.     BP 140/70   Pulse 82   Temp 98.1 F (36.7 C) (Oral)   Ht 5' 2.75" (1.594 m)   Wt 175 lb 12 oz (79.7 kg)   SpO2 98%   BMI 31.38 kg/m    Objective:   Physical Exam  Constitutional: She appears well-nourished.  HENT:  Right Ear: Tympanic membrane and ear canal normal.  Left Ear: Tympanic membrane and ear canal normal.  Nose: Right sinus exhibits no maxillary sinus tenderness and no frontal sinus tenderness. Left sinus exhibits no maxillary sinus tenderness and no frontal sinus tenderness.   Mouth/Throat: Oropharynx is clear and moist.  Eyes: Conjunctivae are normal.  Neck: Neck supple.  Cardiovascular: Normal rate and regular rhythm.  Pulmonary/Chest: Effort normal and breath sounds normal. She has no wheezes. She has no rales.  Lymphadenopathy:    She has no cervical adenopathy.  Skin: Skin is warm and dry.          Assessment & Plan:  URI:  Cough, chills, body aches x 24 hours. Exam today unremarkable with clear lungs, normal vitals, and does not appear ill. Do suspect viral involvement, not influenza. Information provided regarding different OTC treatment for symptoms. Fluids, rest, return precautions provided.  Pleas Koch, NP

## 2018-01-27 NOTE — Patient Instructions (Signed)
Nasal Congestion/Ear Pressure/Sinus Pressure: Try using Flonase (fluticasone) nasal spray. Instill 1 spray in each nostril twice daily.   Cough/Congestion: Try taking Mucinex DM, Robitussin DM, or Delsym DM. This will help loosen up the mucous in your chest. Ensure you take this medication with a full glass of water.  You can take Ibuprofen or Tylenol for body aches, fevers, chills. Do not exceed 2400 mg of Ibuprofen or 3000 mg of tylenol in 24 hours.  Please notify me if you develop persistent fevers of 101, start coughing up green mucous, notice increased fatigue or weakness, and feel worse after 1 week of onset of symptoms.   Increase consumption of water intake and rest.  It was a pleasure to see you today!   Upper Respiratory Infection, Adult Most upper respiratory infections (URIs) are caused by a virus. A URI affects the nose, throat, and upper air passages. The most common type of URI is often called "the common cold." Follow these instructions at home:  Take medicines only as told by your doctor.  Gargle warm saltwater or take cough drops to comfort your throat as told by your doctor.  Use a warm mist humidifier or inhale steam from a shower to increase air moisture. This may make it easier to breathe.  Drink enough fluid to keep your pee (urine) clear or pale yellow.  Eat soups and other clear broths.  Have a healthy diet.  Rest as needed.  Go back to work when your fever is gone or your doctor says it is okay. ? You may need to stay home longer to avoid giving your URI to others. ? You can also wear a face mask and wash your hands often to prevent spread of the virus.  Use your inhaler more if you have asthma.  Do not use any tobacco products, including cigarettes, chewing tobacco, or electronic cigarettes. If you need help quitting, ask your doctor. Contact a doctor if:  You are getting worse, not better.  Your symptoms are not helped by medicine.  You have  chills.  You are getting more short of breath.  You have brown or red mucus.  You have yellow or brown discharge from your nose.  You have pain in your face, especially when you bend forward.  You have a fever.  You have puffy (swollen) neck glands.  You have pain while swallowing.  You have white areas in the back of your throat. Get help right away if:  You have very bad or constant: ? Headache. ? Ear pain. ? Pain in your forehead, behind your eyes, and over your cheekbones (sinus pain). ? Chest pain.  You have long-lasting (chronic) lung disease and any of the following: ? Wheezing. ? Long-lasting cough. ? Coughing up blood. ? A change in your usual mucus.  You have a stiff neck.  You have changes in your: ? Vision. ? Hearing. ? Thinking. ? Mood. This information is not intended to replace advice given to you by your health care provider. Make sure you discuss any questions you have with your health care provider. Document Released: 05/31/2008 Document Revised: 08/15/2016 Document Reviewed: 03/20/2014 Elsevier Interactive Patient Education  2018 [employer].

## 2018-01-28 ENCOUNTER — Emergency Department (HOSPITAL_COMMUNITY)
Admission: EM | Admit: 2018-01-28 | Discharge: 2018-01-28 | Disposition: A | Discharge status: Home / Self Care | Payer: Medicare Other | Attending: Emergency Medicine | Admitting: Emergency Medicine

## 2018-01-28 ENCOUNTER — Emergency Department (HOSPITAL_COMMUNITY): Payer: Medicare Other

## 2018-01-28 ENCOUNTER — Other Ambulatory Visit: Payer: Self-pay

## 2018-01-28 ENCOUNTER — Encounter (HOSPITAL_COMMUNITY): Payer: Self-pay | Admitting: *Deleted

## 2018-01-28 DIAGNOSIS — R0789 Other chest pain: Secondary | ICD-10-CM | POA: Diagnosis not present

## 2018-01-28 DIAGNOSIS — Z79899 Other long term (current) drug therapy: Secondary | ICD-10-CM | POA: Diagnosis not present

## 2018-01-28 DIAGNOSIS — R079 Chest pain, unspecified: Secondary | ICD-10-CM | POA: Diagnosis not present

## 2018-01-28 DIAGNOSIS — R42 Dizziness and giddiness: Secondary | ICD-10-CM | POA: Insufficient documentation

## 2018-01-28 DIAGNOSIS — I10 Essential (primary) hypertension: Secondary | ICD-10-CM | POA: Diagnosis not present

## 2018-01-28 DIAGNOSIS — E119 Type 2 diabetes mellitus without complications: Secondary | ICD-10-CM | POA: Insufficient documentation

## 2018-01-28 DIAGNOSIS — Z7982 Long term (current) use of aspirin: Secondary | ICD-10-CM | POA: Insufficient documentation

## 2018-01-28 LAB — CBC
HCT: 40.9 % (ref 36.0–46.0)
Hemoglobin: 12.7 g/dL (ref 12.0–15.0)
MCH: 27.1 pg (ref 26.0–34.0)
MCHC: 31.1 g/dL (ref 30.0–36.0)
MCV: 87.2 fL (ref 78.0–100.0)
Platelets: 206 10*3/uL (ref 150–400)
RBC: 4.69 MIL/uL (ref 3.87–5.11)
RDW: 15.7 % — ABNORMAL HIGH (ref 11.5–15.5)
WBC: 7.2 10*3/uL (ref 4.0–10.5)

## 2018-01-28 LAB — BASIC METABOLIC PANEL
Anion gap: 12 (ref 5–15)
BUN: 21 mg/dL — ABNORMAL HIGH (ref 6–20)
CO2: 24 mmol/L (ref 22–32)
Calcium: 10.2 mg/dL (ref 8.9–10.3)
Chloride: 102 mmol/L (ref 101–111)
Creatinine, Ser: 1.16 mg/dL — ABNORMAL HIGH (ref 0.44–1.00)
GFR calc Af Amer: 55 mL/min — ABNORMAL LOW (ref >60)
GFR calc non Af Amer: 48 mL/min — ABNORMAL LOW (ref >60)
Glucose, Bld: 163 mg/dL — ABNORMAL HIGH (ref 65–99)
Potassium: 4.2 mmol/L (ref 3.5–5.1)
Sodium: 138 mmol/L (ref 135–145)

## 2018-01-28 LAB — TROPONIN I: Troponin I: <0.03 ng/mL (ref <0.03)

## 2018-01-28 LAB — I-STAT TROPONIN, ED: Troponin i, poc: 0 ng/mL (ref 0.00–0.08)

## 2018-01-28 NOTE — ED Triage Notes (Addendum)
To ED for eval of chest soreness and dizziness that started a few days ago. States she was seen yesterday by pcp and told she has a virus. No fevers. Has a non-productive cough. Pt also states that she has had numbness to both hands and her lips. No obvious neuro deficits noted- speech is clear, grips equal and strong, no facial asymmetry. Pt states chest soreness worse or better with movement.

## 2018-01-28 NOTE — ED Provider Notes (Signed)
[facility] EMERGENCY DEPARTMENT Provider Note   CSN: [id number] Arrival date & time: 01/28/18  5852     History   Chief Complaint Chief Complaint  Patient presents with  . Chest Pain  . Dizziness    HPI Brenda Contreras is a 67 y.o. female.  HPI  The patient is a 67 year old female, she has a known diabetic, she is treated for hypertension and has a history of dizziness in the past.  She states that over the last several days she has developed multiple symptoms including a mild cough, she had body aches, she has had some lightheadedness and she was seen at her doctor's office for the same.  She reports that no testing was done, her exam was reassuring and she was told that she likely had a viral illness.  Over the last couple of days she has continued to have the symptoms at a low level but today started to develop some very mild numbness of bilateral hands and bilateral feet as well as a perioral numbness or tingling.  The symptoms have improved this morning and she has very mild symptoms at this time but is still concerned.  She has had ongoing mild cough and states that she has some discomfort in her chest when she leans forward which is not associated with deep breathing exertion or lying backwards.  She reports that her lightheadedness is more when she tries to stand, not so much when she is sitting or lying down.  There is been no visual changes, no weakness, no trouble with fine motor control and no difficulty with balance or speech.  She reports no fevers or chills, her body aches have gone away, there is no urinary symptoms or diarrhea.  She checked her blood sugar this morning and it was just over 100.  She has been eating and drinking but has a slight decrease in her appetite.  Past Medical History:  Diagnosis Date  . Diabetes mellitus   . Dizziness   . GERD (gastroesophageal reflux disease)   . High cholesterol   . Hypertension   . Vaginal dryness      Patient Active Problem List   Diagnosis Date Noted  . Encounter for diabetic foot exam ([place]) 05/05/2017  . Medicare annual wellness visit, initial 04/25/2017  . HTN (hypertension) 01/13/2016  . GERD (gastroesophageal reflux disease) 01/13/2016  . Diabetes ([place]) 01/13/2016  . HLD (hyperlipidemia) 01/13/2016    Past Surgical History:  Procedure Laterality Date  . APPENDECTOMY    . BREAST BIOPSY    . TUBAL LIGATION      OB History    No data available       Home Medications    Prior to Admission medications   Medication Sig Start Date End Date Taking? Authorizing Provider  amLODipine (NORVASC) 5 MG tablet Take 1 tablet (5 mg total) by mouth daily. 12/12/17   Jearld Fenton, NP  aspirin EC 81 MG tablet Take 81 mg by mouth daily.    [provider]  carvedilol (COREG) 6.25 MG tablet Take 1 tablet (6.25 mg total) by mouth at bedtime. 12/12/17   Jearld Fenton, NP  dapagliflozin propanediol (FARXIGA) 5 MG TABS tablet Take 5 mg by mouth daily. 12/12/17   Jearld Fenton, NP  olmesartan-hydrochlorothiazide (BENICAR HCT) 40-25 MG tablet Take 1 tablet by mouth daily. 12/12/17   Jearld Fenton, NP  omeprazole-sodium bicarbonate (ZEGERID) 40-1100 MG capsule Take 1 capsule by mouth daily before  breakfast. 05/17/17   Lucille Passy, MD  rosuvastatin (CRESTOR) 20 MG tablet Take 1 tablet (20 mg total) by mouth at bedtime. 12/12/17 12/12/18  Jearld Fenton, NP  sitaGLIPtin-metformin (JANUMET) 50-1000 MG tablet Take 1 tablet by mouth 2 (two) times daily with a meal. 12/12/17   Baity, Coralie Keens, NP    Family History Family History  Problem Relation Age of Onset  . Diabetes Father   . Hypertension Father   . Hyperlipidemia Father   . Stroke Father   . Diabetes Sister   . Hyperlipidemia Sister   . Hypertension Sister   . Lung cancer Paternal Uncle   . Diabetes Paternal Grandmother   . Heart disease Paternal Grandfather     Social History Social History   Tobacco Use   . Smoking status: Never Smoker  . Smokeless tobacco: Never Used  Substance Use Topics  . Alcohol use: No  . Drug use: No     Allergies   Patient has no known allergies.   Review of Systems Review of Systems  All other systems reviewed and are negative.    Physical Exam Updated Vital Signs BP (!) 176/97 (BP Location: Right Arm)   Pulse 81   Temp 97.8 F (36.6 C) (Oral)   Resp 16   SpO2 98%   Physical Exam  Constitutional: She appears well-developed and well-nourished. No distress.  HENT:  Head: Normocephalic and atraumatic.  Mouth/Throat: Oropharynx is clear and moist. No oropharyngeal exudate.  Oral cavity is patent clear and pharyngeal structures appear normal without exudate or erythema asymmetry or hypertrophy, phonation is normal  Eyes: Conjunctivae and EOM are normal. Pupils are equal, round, and reactive to light. Right eye exhibits no discharge. Left eye exhibits no discharge. No scleral icterus.  Pupils are reactive, normal extraocular movements  Neck: Normal range of motion. Neck supple. No JVD present. No thyromegaly present.  No lymphadenopathy of the neck, very supple, no trismus or torticollis  Cardiovascular: Normal rate, regular rhythm, normal heart sounds and intact distal pulses. Exam reveals no gallop and no friction rub.  No murmur heard. No murmurs, normal pulses, no JVD  Pulmonary/Chest: Effort normal and breath sounds normal. No respiratory distress. She has no wheezes. She has no rales.  Speaks in full sentences, no increased work of breathing, totally clear lung sounds without any abnormal findings  Abdominal: Soft. Bowel sounds are normal. She exhibits no distension and no mass. There is no tenderness.  Soft nontender abdomen  Musculoskeletal: Normal range of motion. She exhibits no edema or tenderness.  Normal range of motion of all major joints, normal grips  Lymphadenopathy:    She has no cervical adenopathy.  Neurological: She is alert.  Coordination normal.  Speech is clear, coordination is normal, movement is coordinated by finger-nose-finger and basic movements including her gait, speech is clear, goal-directed, strength is normal in all 4 extremities.  Sensation is also normal in all 4 extremities.  Cranial nerves III through XII are normal, there is no facial droop.  There is no objective numbness  Skin: Skin is warm and dry. No rash noted. No erythema.  Psychiatric: She has a normal mood and affect. Her behavior is normal.  Nursing note and vitals reviewed.    ED Treatments / Results  Labs (all labs ordered are listed, but only abnormal results are displayed) Labs Reviewed  BASIC METABOLIC PANEL  CBC  TROPONIN I  I-STAT TROPONIN, ED    EKG  EKG Interpretation  Date/Time:  Saturday January 28 2018 08:39:24 EST Ventricular Rate:  83 PR Interval:  148 QRS Duration: 78 QT Interval:  392 QTC Calculation: 460 R Axis:   51 Text Interpretation:  Normal sinus rhythm Cannot rule out Anterior infarct , age undetermined Abnormal ECG since last tracing no significant change Confirmed by Noemi Chapel [phone]) on 01/28/2018 10:34:30 AM       Radiology No results found.  Procedures Procedures (including critical care time)  Medications Ordered in ED Medications - No data to display   Initial Impression / Assessment and Plan / ED Course  I have reviewed the triage vital signs and the nursing notes.  Pertinent labs & imaging results that were available during my care of the patient were reviewed by me and considered in my medical decision making (see chart for details).  Clinical Course as of Jan 28 1213  Sat Jan 28, 2018  0959 Creatinine: (!) 1.16 [BM]  1194 Labs reviewed, normal white count, normal hemoglobin at 12.7, creatinine of 1.16 is close to baseline, glucose is slightly elevated.  There is no anion gap. WBC: 7.2 [BM]  1002 I have personally viewed and interpreted the 2 view PA and lateral chest x-ray,  I find that there is no infiltrates effusions pneumothorax or abnormal mediastinum.  No subdiaphragmatic air.  Normal x-ray  [BM]    Clinical Course User Index [BM] Noemi Chapel, MD    The patient is well-appearing, there is no findings on exam to suggest a neurological disorder, she is nonfocal, she is nonlateralizing, her pulmonary and cardiac exams are normal, will check a chest x-ray EKG check potassium, she does not have any indications for advanced imaging of the brain, she is otherwise well-appearing.  The pt has normal labs except for Cr that is slightly elevated Pt informed Given copy of her labs Stable for d/c Without any acute findings on EKG, chest x-ray or blood work the patient is stable for discharge.  Clinically she feels well and is amenable to the plan, states that she will return should her symptoms worsen   Final Clinical Impressions(s) / ED Diagnoses   Final diagnoses:  Light headed  Chest pain, unspecified type      Noemi Chapel, MD 01/28/18 1215

## 2018-01-28 NOTE — Discharge Instructions (Signed)
Your testing here showed no significant findings except for your kidney test which showed a very slight dehydration of your kidneys You should drink plenty of water and liquids today, please have your kidney function rechecked within 2 weeks at your doctor's office Seek medical attention in the emergency department for severe or worsening symptoms or any further concerns I would highly recommend that you see your doctor in follow-up within 3 days

## 2018-02-06 ENCOUNTER — Encounter: Payer: Self-pay | Admitting: Primary Care

## 2018-02-06 ENCOUNTER — Ambulatory Visit (INDEPENDENT_AMBULATORY_CARE_PROVIDER_SITE_OTHER): Payer: Medicare Other | Admitting: Primary Care

## 2018-02-06 VITALS — BP 136/58 | HR 78 | Temp 98.0°F | Ht 62.75 in | Wt 155.8 lb

## 2018-02-06 DIAGNOSIS — K219 Gastro-esophageal reflux disease without esophagitis: Secondary | ICD-10-CM | POA: Diagnosis not present

## 2018-02-06 MED ORDER — PANTOPRAZOLE SODIUM 20 MG PO TBEC: 20.0000 mg | DELAYED_RELEASE_TABLET | Freq: Every day | ORAL | 0 refills | Status: DC

## 2018-02-06 NOTE — Patient Instructions (Signed)
Stop omeprazole-sodium bicarbonate tablets for heartburn.  Start pantoprazole 20 mg tablets for heartburn. Take 1 tablet by mouth once daily.   Refrain from laying down within 2 hours after eating.   Please update me in 2 weeks.  It was a pleasure to see you today!   Food Choices for Gastroesophageal Reflux Disease, Adult When you have gastroesophageal reflux disease (GERD), the foods you eat and your eating habits are very important. Choosing the right foods can help ease your discomfort. What guidelines do I need to follow?  Choose fruits, vegetables, whole grains, and low-fat dairy products.  Choose low-fat meat, fish, and poultry.  Limit fats such as oils, salad dressings, butter, nuts, and avocado.  Keep a food diary. This helps you identify foods that cause symptoms.  Avoid foods that cause symptoms. These may be different for everyone.  Eat small meals often instead of 3 large meals a day.  Eat your meals slowly, in a place where you are relaxed.  Limit fried foods.  Cook foods using methods other than frying.  Avoid drinking alcohol.  Avoid drinking large amounts of liquids with your meals.  Avoid bending over or lying down until 2-3 hours after eating. What foods are not recommended? These are some foods and drinks that may make your symptoms worse: Vegetables Tomatoes. Tomato juice. Tomato and spaghetti sauce. Chili peppers. Onion and garlic. Horseradish. Fruits Oranges, grapefruit, and lemon (fruit and juice). Meats High-fat meats, fish, and poultry. This includes hot dogs, ribs, ham, sausage, salami, and bacon. Dairy Whole milk and chocolate milk. Sour cream. Cream. Butter. Ice cream. Cream cheese. Drinks Coffee and tea. Bubbly (carbonated) drinks or energy drinks. Condiments Hot sauce. Barbecue sauce. Sweets/Desserts Chocolate and cocoa. Donuts. Peppermint and spearmint. Fats and Oils High-fat foods. This includes [place] fries and potato  chips. Other Vinegar. Strong spices. This includes black pepper, white pepper, red pepper, cayenne, curry powder, cloves, ginger, and chili powder. The items listed above may not be a complete list of foods and drinks to avoid. Contact your dietitian for more information. This information is not intended to replace advice given to you by your health care provider. Make sure you discuss any questions you have with your health care provider. Document Released: 06/13/2012 Document Revised: 05/20/2016 Document Reviewed: 10/17/2013 Elsevier Interactive Patient Education  2017 [employer].

## 2018-02-06 NOTE — Assessment & Plan Note (Signed)
Symptoms today represent uncontrolled GERD.  Cardiac workup unremarkable in the emergency department, reassuring.  Discussed to avoid laying down within 2 hours after eating, handout for trigger foods of GERD provided today.  Stop Zegerid, switch to pantoprazole 20 mg.  Consider adding in Zantac 150 once daily if needed.  She will update in several weeks.

## 2018-02-06 NOTE — Progress Notes (Signed)
Subjective:    Patient ID: Brenda Contreras, female    DOB: 14-Jul-1951, 67 y.o.   MRN: [id number]  HPI  Brenda Contreras is a 67 year old female who presents today for emergency department follow up.  She presented to [facility] on 01/28/18 with a chief complaint of dizziness and chest pain. Over the past several days she'd experienced cough, body aches, lightheadedness which was diagnosed as a viral URI several days prior. Several days later she began to develop bilateral hand and foot numbness with perioral numbness. Her cough had progressed and her lightheadedness was worse with position changes from sitting to standing.   During her stay in the ED she underwent ECG with NSR, rate of 83; labs (normal WBC count, slight elevation in glucose, negative troponin, otherwise unremarkable); chest xray (no infiltrates, effusions, pneumothorax). She was discharged home later that day given normal work up.  Since her ED visit she's noticed substernal chest pressure, flatulence, belching, esophageal burning, feeling as though something in the back of her throat. She's not taken anything OTC for her symptoms, and is compliant to her omeprazole-sodium bicarbonate daily. Symptoms began late January/early February.   Her symptoms are not worse with eating. Most evenings she'll eat a late dinner and then go to bed. She has noticed her symptoms improve with laying down, but will wake from sleep with symptoms when laying on her right side. She denies injury/trauma, vomiting, abdominal pain, chest pain, numbness/tingling.   Review of Systems  Constitutional: Negative for fatigue.  Respiratory: Negative for shortness of breath.   Cardiovascular: Negative for chest pain.  Gastrointestinal: Negative for abdominal pain, constipation, diarrhea, nausea and vomiting.       Belching, chest pressure, flatulence  Neurological: Negative for numbness.       Past Medical History:  Diagnosis Date  . Diabetes mellitus   .  Dizziness   . GERD (gastroesophageal reflux disease)   . High cholesterol   . Hypertension   . Vaginal dryness      Social History   Socioeconomic History  . Marital status: Married    Spouse name: Not on file  . Number of children: Not on file  . Years of education: Not on file  . Highest education level: Not on file  Social Needs  . Financial resource strain: Not on file  . Food insecurity - worry: Not on file  . Food insecurity - inability: Not on file  . Transportation needs - medical: Not on file  . Transportation needs - non-medical: Not on file  Occupational History  . Not on file  Tobacco Use  . Smoking status: Never Smoker  . Smokeless tobacco: Never Used  Substance and Sexual Activity  . Alcohol use: No  . Drug use: No  . Sexual activity: Yes  Other Topics Concern  . 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      Has two children and two grandchildren.      Does not have a living will.  Desires CPR, would not want prolonged life support if futile.    Past Surgical History:  Procedure Laterality Date  . APPENDECTOMY    . BREAST BIOPSY    . TUBAL LIGATION      Family History  Problem Relation Age of Onset  . Diabetes Father   . Hypertension Father   . Hyperlipidemia Father   . Stroke Father   . Diabetes Sister   .  Hyperlipidemia Sister   . Hypertension Sister   . Lung cancer Paternal Uncle   . Diabetes Paternal Grandmother   . Heart disease Paternal Grandfather     No Known Allergies  Current Outpatient Medications on File Prior to Visit  Medication Sig Dispense Refill  . amLODipine (NORVASC) 5 MG tablet Take 1 tablet (5 mg total) by mouth daily. 90 tablet 0  . aspirin EC 81 MG tablet Take 81 mg by mouth daily.    . carvedilol (COREG) 6.25 MG tablet Take 1 tablet (6.25 mg total) by mouth at bedtime. 90 tablet 0  . dapagliflozin propanediol (FARXIGA) 5 MG TABS tablet Take 5 mg by mouth daily. 90 tablet 0  .  olmesartan-hydrochlorothiazide (BENICAR HCT) 40-25 MG tablet Take 1 tablet by mouth daily. 90 tablet 0  . rosuvastatin (CRESTOR) 20 MG tablet Take 1 tablet (20 mg total) by mouth at bedtime. 90 tablet 0  . sitaGLIPtin-metformin (JANUMET) 50-1000 MG tablet Take 1 tablet by mouth 2 (two) times daily with a meal. 180 tablet 0   No current facility-administered medications on file prior to visit.     BP (!) 136/58   Pulse 78   Temp 98 F (36.7 C) (Oral)   Ht 5' 2.75" (1.594 m)   Wt 155 lb 12 oz (70.6 kg)   SpO2 98%   BMI 27.81 kg/m    Objective:   Physical Exam  Constitutional: She appears well-nourished.  Cardiovascular: Normal rate and regular rhythm.  Pulmonary/Chest: Effort normal and breath sounds normal.  Abdominal: Soft. Bowel sounds are normal. There is no tenderness.  Skin: Skin is warm and dry.          Assessment & Plan:  All hospital labs, imaging, notes reviewed.

## 2018-02-20 ENCOUNTER — Encounter: Payer: Self-pay | Admitting: Primary Care

## 2018-02-21 ENCOUNTER — Encounter: Payer: Self-pay | Admitting: Primary Care

## 2018-02-21 ENCOUNTER — Ambulatory Visit (INDEPENDENT_AMBULATORY_CARE_PROVIDER_SITE_OTHER): Payer: Medicare Other | Admitting: Primary Care

## 2018-02-21 VITALS — BP 124/62 | HR 82 | Temp 98.5°F | Wt 156.5 lb

## 2018-02-21 DIAGNOSIS — E119 Type 2 diabetes mellitus without complications: Secondary | ICD-10-CM | POA: Diagnosis not present

## 2018-02-21 DIAGNOSIS — R079 Chest pain, unspecified: Secondary | ICD-10-CM | POA: Diagnosis not present

## 2018-02-21 DIAGNOSIS — I10 Essential (primary) hypertension: Secondary | ICD-10-CM

## 2018-02-21 DIAGNOSIS — K219 Gastro-esophageal reflux disease without esophagitis: Secondary | ICD-10-CM

## 2018-02-21 DIAGNOSIS — E785 Hyperlipidemia, unspecified: Secondary | ICD-10-CM

## 2018-02-21 NOTE — Assessment & Plan Note (Signed)
Daily since early February 2019. ED evaluation in early February with unremarkable work up. Repeat ECG today with NSR, rate of 76, no ST elevation/depression, unchanged from ECG on 01/27/18.  She does have risk factors for heart disease given diabetes, hyperlipidemia, and hypertension; and given chest pain location and symptoms it would be very reasonable for cardiology evaluation for at least stress test. Discussed this with patient, she agrees. Referral placed.   In the meantime, will reduce Crestor dose to 1/2 tablet daily, increase pantoprazole to 40 mg daily, and have her update via My Chart.

## 2018-02-21 NOTE — Assessment & Plan Note (Signed)
Improved on pantoprazole 20 mg, still experiencing some GERD symptoms. Also consider chest pain symptoms to be secondary to GERD. Increase pantoprazole dose to 40 mg for the time being. Goal will be to reduce back down.

## 2018-02-21 NOTE — Assessment & Plan Note (Signed)
Stable in the office today, continue carvedilol and olmesartan-HCTZ.

## 2018-02-21 NOTE — Progress Notes (Signed)
Subjective:    Patient ID: Brenda Contreras, female    DOB: 1951-12-15, 67 y.o.   MRN: [id number]  HPI  Brenda Contreras is a 67 year old female with a history of GERD, type 2 diabetes, hypertension who presents today with a chief complaint of chest pain.  She presented to [facility] on 01/28/18 with a chief complaint of chest pain and dizziness. She endorsed symptoms of cough, body aches, lightheadedness, bilateral hand and foot numbness, perioral numbness, chest discomfort when leaning forward. Her lightheadedness was present with position changes to standing. She denied visual changes, weakness, difficulty with speech or balance, fevers, urinary symptoms, GI symptoms.   During her stay in the ED she underwent ECG with NSR, rate of 83, no acute findings. She underwent blood work including CBC, Troponin x 2 , BMP which was unremarkable. Chest xray unremarkable. Her exam was reassuring without evidence of neurological, cardiac, pulmonary involvement so she was discharged home later that day.  She was evaluated several days later in our office for ED follow up with complaints of chest pressure, belching, flatulence, esophageal burning. Her symptoms were presumed to be GERD related so her medication was switched from omeprazole-sodium bicarbonate to pantoprazole 20 mg.   She sent a message via my chart yesterday with reports of improvement in GERD symptoms for "4-5 days" then symptoms of left breast and upper extremity, shoulder, neck, and back pain. Her pain was improved when laying down. Given this message she was advised to return for follow up.  Since her last visit she has noticed a return in belching. She's experiencing an intermittent "nagging pain" that will start to the substernal chest and radiate to the left chest, left shoulder, down to the mid upper arm, and to her upper posterior back. Her pain will improve when laying down, it will resume with activity. Her symptoms are daily, most of the day.  Something doesn't "feel right". Her symptoms began in late January/early February 2019.  She also reports post nasal drip which causes her to cough. She denies shoulder pain, neck pain, recent injury/trauma, numbness/tingling. She doesn't take anything OTC for her chest pain symptoms. She's compliant to her pantoprazole 20 mg.   Review of Systems  Constitutional: Negative for diaphoresis and fatigue.  Eyes: Negative for visual disturbance.  Respiratory: Negative for shortness of breath.   Cardiovascular: Positive for chest pain.  Gastrointestinal: Negative for abdominal pain.  Neurological: Negative for dizziness, numbness and headaches.       Past Medical History:  Diagnosis Date  . Diabetes mellitus   . Dizziness   . GERD (gastroesophageal reflux disease)   . High cholesterol   . Hypertension   . Vaginal dryness      Social History   Socioeconomic History  . Marital status: Married    Spouse name: Not on file  . Number of children: Not on file  . Years of education: Not on file  . Highest education level: Not on file  Social Needs  . Financial resource strain: Not on file  . Food insecurity - worry: Not on file  . Food insecurity - inability: Not on file  . Transportation needs - medical: Not on file  . Transportation needs - non-medical: Not on file  Occupational History  . Not on file  Tobacco Use  . Smoking status: Never Smoker  . Smokeless tobacco: Never Used  Substance and Sexual Activity  . Alcohol use: No  . Drug use: No  . Sexual  activity: Yes  Other Topics Concern  . 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      Has two children and two grandchildren.      Does not have a living will.  Desires CPR, would not want prolonged life support if futile.    Past Surgical History:  Procedure Laterality Date  . APPENDECTOMY    . BREAST BIOPSY    . TUBAL LIGATION      Family History  Problem Relation Age of Onset  .  Diabetes Father   . Hypertension Father   . Hyperlipidemia Father   . Stroke Father   . Diabetes Sister   . Hyperlipidemia Sister   . Hypertension Sister   . Lung cancer Paternal Uncle   . Diabetes Paternal Grandmother   . Heart disease Paternal Grandfather     No Known Allergies  Current Outpatient Medications on File Prior to Visit  Medication Sig Dispense Refill  . amLODipine (NORVASC) 5 MG tablet Take 1 tablet (5 mg total) by mouth daily. 90 tablet 0  . aspirin EC 81 MG tablet Take 81 mg by mouth daily.    . carvedilol (COREG) 6.25 MG tablet Take 1 tablet (6.25 mg total) by mouth at bedtime. 90 tablet 0  . dapagliflozin propanediol (FARXIGA) 5 MG TABS tablet Take 5 mg by mouth daily. 90 tablet 0  . olmesartan-hydrochlorothiazide (BENICAR HCT) 40-25 MG tablet Take 1 tablet by mouth daily. 90 tablet 0  . pantoprazole (PROTONIX) 20 MG tablet Take 1 tablet (20 mg total) by mouth daily. 90 tablet 0  . rosuvastatin (CRESTOR) 20 MG tablet Take 1 tablet (20 mg total) by mouth at bedtime. 90 tablet 0  . sitaGLIPtin-metformin (JANUMET) 50-1000 MG tablet Take 1 tablet by mouth 2 (two) times daily with a meal. 180 tablet 0   No current facility-administered medications on file prior to visit.     BP 124/62   Pulse 82   Temp 98.5 F (36.9 C) (Oral)   Wt 156 lb 8 oz (71 kg)   SpO2 98%   BMI 27.94 kg/m    Objective:   Physical Exam  Constitutional: She appears well-nourished.  Neck: Neck supple.  Cardiovascular: Normal rate and regular rhythm.  Pulmonary/Chest: Effort normal and breath sounds normal.  Abdominal: Soft. Bowel sounds are normal. There is no tenderness.  Skin: Skin is warm and dry.  Psychiatric: She has a normal mood and affect.          Assessment & Plan:

## 2018-02-21 NOTE — Assessment & Plan Note (Addendum)
Improved lipids with increased dose of Crestor at 20 mg. Consider symptoms over the past month to be MSK secondary to increased Crestor dose, will have her reduce her dose to 1/2 tablet daily to see if symptoms improve. Meanwhile, will send to cardiology for further evaluation including stress test.

## 2018-02-21 NOTE — Assessment & Plan Note (Signed)
Stable from November 2018. Continue [place] and [place].

## 2018-02-21 NOTE — Patient Instructions (Addendum)
Cut the rosuvastatin dose in half for the next two weeks.   Increase pantoprazole to 40 mg once daily. You may take two of the 20 mg tablets of your current prescription.   Please update me in two weeks if no improvement of heart burn or chest pain symptoms.   You will be contacted regarding your referral to Cardiology. Please let us know if you have not been contacted within one week.   It was a pleasure to see you today!

## 2018-02-22 ENCOUNTER — Ambulatory Visit: Payer: Medicare Other | Admitting: Primary Care

## 2018-03-02 ENCOUNTER — Other Ambulatory Visit: Payer: Self-pay | Admitting: Primary Care

## 2018-03-02 ENCOUNTER — Other Ambulatory Visit: Payer: Self-pay | Admitting: Internal Medicine

## 2018-03-02 ENCOUNTER — Encounter: Payer: Self-pay | Admitting: Primary Care

## 2018-03-02 DIAGNOSIS — E119 Type 2 diabetes mellitus without complications: Secondary | ICD-10-CM

## 2018-03-02 DIAGNOSIS — E785 Hyperlipidemia, unspecified: Secondary | ICD-10-CM

## 2018-03-02 DIAGNOSIS — K219 Gastro-esophageal reflux disease without esophagitis: Secondary | ICD-10-CM

## 2018-03-02 DIAGNOSIS — I10 Essential (primary) hypertension: Secondary | ICD-10-CM

## 2018-03-02 NOTE — Telephone Encounter (Signed)
Ok to refill? Electronically refill request   Last seen on 02/21/2018

## 2018-03-03 MED ORDER — CARVEDILOL 6.25 MG PO TABS: 6.2500 mg | ORAL_TABLET | Freq: Every day | ORAL | 3 refills | Status: DC

## 2018-03-03 MED ORDER — DAPAGLIFLOZIN PROPANEDIOL 5 MG PO TABS: 5.0000 mg | ORAL_TABLET | Freq: Every day | ORAL | 1 refills | Status: DC

## 2018-03-03 MED ORDER — OLMESARTAN MEDOXOMIL-HCTZ 40-25 MG PO TABS: 1.0000 | ORAL_TABLET | Freq: Every day | ORAL | 3 refills | Status: DC

## 2018-03-03 MED ORDER — AMLODIPINE BESYLATE 5 MG PO TABS: 5.0000 mg | ORAL_TABLET | Freq: Every day | ORAL | 3 refills | Status: DC

## 2018-03-03 MED ORDER — SITAGLIPTIN PHOS-METFORMIN HCL 50-1000 MG PO TABS: 1.0000 | ORAL_TABLET | Freq: Two times a day (BID) | ORAL | 1 refills | Status: DC

## 2018-03-03 MED ORDER — PANTOPRAZOLE SODIUM 20 MG PO TBEC: DELAYED_RELEASE_TABLET | ORAL | 0 refills | Status: DC

## 2018-03-03 MED ORDER — ROSUVASTATIN CALCIUM 20 MG PO TABS: 20.0000 mg | ORAL_TABLET | Freq: Every day | ORAL | 3 refills | Status: DC

## 2018-03-03 NOTE — Telephone Encounter (Signed)
Increasing pantoprazole to 40 mg for the next month, will reduce down to 20 mg thereafter.

## 2018-03-03 NOTE — Telephone Encounter (Signed)
Refills sent to pharmacy. 

## 2018-03-07 ENCOUNTER — Encounter: Payer: Self-pay | Admitting: Primary Care

## 2018-03-08 ENCOUNTER — Encounter: Payer: Self-pay | Admitting: Primary Care

## 2018-03-14 ENCOUNTER — Encounter: Payer: Self-pay | Admitting: Cardiology

## 2018-03-14 ENCOUNTER — Ambulatory Visit (INDEPENDENT_AMBULATORY_CARE_PROVIDER_SITE_OTHER): Payer: Medicare Other | Admitting: Cardiology

## 2018-03-14 VITALS — BP 144/78 | HR 84 | Ht 62.75 in | Wt 157.8 lb

## 2018-03-14 DIAGNOSIS — R079 Chest pain, unspecified: Secondary | ICD-10-CM

## 2018-03-14 NOTE — Progress Notes (Signed)
03/14/2018 Brenda Contreras   June 05, 1951  [id number]  Primary Physician Pleas Koch, NP Primary Cardiologist: New (Discussed w/ Dr. Lovena Le, [place])  Reason for Visit/CC: New Pt Evaluation for Chest Pain   HPI:  Brenda Contreras is a 67 y.o. female who is being seen today for the evaluation of chest pain at the request of Pleas Koch, NP.  Pt has a h/o treated HTN, HLD and DM. She also has a family h/o CAD. Her twin sister has CAD and got stents in her early 52s. She has no h/o tobacco use. Pt was evaluated 11 years ago for chest pain. She was seen by Dr. Einar Gip and had a LHC that showed normal coronaries. This was done in 2008. Her CP at that time was felt to be related to GERD and PPI therapy was recommended.   Over the last month, she has had recurrent left sided chest pain radiating to her left shoulder. It does not seem to be exertional. She has had frequent belching associated with pain and had improvement with TUMs. She was seen in the ED 02/21/18. W/u was negative. EKG nonischemic. Troponins negative x 2. She was reassured in the ED and had f/u with her PCP. Her PCP changed her PPI to Protonix and referred to cardiology given her risk factors.   Today in clinic, pt reports complete resolution of symptoms since starting Protonix. No recurrent CP or shoulder pain. No dyspnea with exertional activities.   Cardiac Procedures LHC 09/08/2007 RESULTS:   HEMODYNAMIC DATA:  1. Aortic systolic pressure 270 and diastolic pressure 90.  2. Left ventricular systolic pressure 350 and diastolic pressure 43.   ANGIOGRAPHIC DATA:  Selective coronary angiography:  1. Left Main:  The left main is normal.   1. Left Anterior Descending:  The LAD is normal.   1. Circumflex:  The left circumflex is normal.   1. Right Coronary Artery:  The right coronary artery was dominant and      normal.   1. Left Ventriculography:  RAO left ventriculogram was performed using      25 mL of  Visipaque dye at 12 mL per second.  The overall LVEF was      estimated a greater than 60% without focal wall motion      abnormalities.   IMPRESSION:  The patient has some normal coronaries and normal left  ventricular function.  I believe her chest pain is noncardiac.  Empiric  antireflux therapy will be recommended.   Current Meds  Medication Sig  . amLODipine (NORVASC) 5 MG tablet Take 1 tablet (5 mg total) by mouth daily.  Marland Kitchen aspirin EC 81 MG tablet Take 81 mg by mouth daily.  . carvedilol (COREG) 6.25 MG tablet Take 1 tablet (6.25 mg total) by mouth at bedtime.  . dapagliflozin propanediol (FARXIGA) 5 MG TABS tablet Take 5 mg by mouth daily.  Marland Kitchen olmesartan-hydrochlorothiazide (BENICAR HCT) 40-25 MG tablet Take 1 tablet by mouth daily.  . pantoprazole (PROTONIX) 20 MG tablet Take 2 tablets daily for 4 weeks, then reduce to 1 tablet daily thereafter.  . rosuvastatin (CRESTOR) 20 MG tablet Take 1 tablet (20 mg total) by mouth at bedtime.  . sitaGLIPtin-metformin (JANUMET) 50-1000 MG tablet Take 1 tablet by mouth 2 (two) times daily with a meal.   No Known Allergies Past Medical History:  Diagnosis Date  . Diabetes mellitus   . Dizziness   . GERD (gastroesophageal reflux disease)   . High cholesterol   .  Hypertension   . Vaginal dryness    Family History  Problem Relation Age of Onset  . Diabetes Father   . Hypertension Father   . Hyperlipidemia Father   . Stroke Father   . Diabetes Sister   . Hyperlipidemia Sister   . Hypertension Sister   . Lung cancer Paternal Uncle   . Diabetes Paternal Grandmother   . Heart disease Paternal Grandfather    Past Surgical History:  Procedure Laterality Date  . APPENDECTOMY    . BREAST BIOPSY    . TUBAL LIGATION     Social History   Socioeconomic History  . Marital status: Married    Spouse name: Not on file  . Number of children: Not on file  . Years of education: Not on file  . Highest education level: Not on file  Social  Needs  . Financial resource strain: Not on file  . Food insecurity - worry: Not on file  . Food insecurity - inability: Not on file  . Transportation needs - medical: Not on file  . Transportation needs - non-medical: Not on file  Occupational History  . Not on file  Tobacco Use  . Smoking status: Never Smoker  . Smokeless tobacco: Never Used  Substance and Sexual Activity  . Alcohol use: No  . Drug use: No  . Sexual activity: Yes  Other Topics Concern  . 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      Has two children and two grandchildren.      Does not have a living will.  Desires CPR, would not want prolonged life support if futile.     Review of Systems: General: negative for chills, fever, night sweats or weight changes.  Cardiovascular: negative for chest pain, dyspnea on exertion, edema, orthopnea, palpitations, paroxysmal nocturnal dyspnea or shortness of breath Dermatological: negative for rash Respiratory: negative for cough or wheezing Urologic: negative for hematuria Abdominal: negative for nausea, vomiting, diarrhea, bright red blood per rectum, melena, or hematemesis Neurologic: negative for visual changes, syncope, or dizziness All other systems reviewed and are otherwise negative except as noted above.   Physical Exam:  Blood pressure (!) 144/78, pulse 84, height 5' 2.75" (1.594 m), weight 157 lb 12.8 oz (71.6 kg), SpO2 98 %.  General appearance: alert, cooperative and no distress Neck: no carotid bruit and no JVD Lungs: clear to auscultation bilaterally Heart: regular rate and rhythm, S1, S2 normal, no murmur, click, rub or gallop Extremities: extremities normal, atraumatic, no cyanosis or edema Pulses: 2+ and symmetric Skin: Skin color, texture, turgor normal. No rashes or lesions Neurologic: Grossly normal  EKG NSR, no ischemic abnormalities 02/21/18 -- personally reviewed   ASSESSMENT AND PLAN:   1. Chest Pain: the  characteristics of her CP are more c/w GERD. No exertional component. Was associated with frequent bleaching and temporarily relieved with TUMs. Her PPI was changed to Prontonix, which has resulted in complete resolution of symptoms. I have reviewed pt case with Dr. Lovena Le, DOD. We have also reviewed chart. She had normal coronaries on cardiac cath in 2008 and recent ED visit noted negative cardiac enzymes and normal EKG. At this time, we recommend continuation of Protonix for GERD. No indication for further cardiac testing at this time, in the absence of symptoms. If she has recurrent CP, then we can reconsider either stress testing vs coronary CTA for further evaluation. Continue management of cardiac risk factors. F/u as needed.  Follow-Up PRN  Nelida Gores, MHS [facility] 03/14/2018 9:13 AM

## 2018-03-14 NOTE — Patient Instructions (Addendum)

## 2018-04-27 DIAGNOSIS — Z1231 Encounter for screening mammogram for malignant neoplasm of breast: Secondary | ICD-10-CM | POA: Diagnosis not present

## 2018-04-27 DIAGNOSIS — Z01419 Encounter for gynecological examination (general) (routine) without abnormal findings: Secondary | ICD-10-CM | POA: Diagnosis not present

## 2018-04-27 DIAGNOSIS — Z6827 Body mass index (BMI) 27.0-27.9, adult: Secondary | ICD-10-CM | POA: Diagnosis not present

## 2018-04-28 ENCOUNTER — Other Ambulatory Visit: Payer: Self-pay | Admitting: Primary Care

## 2018-04-28 ENCOUNTER — Telehealth: Payer: Self-pay | Admitting: Primary Care

## 2018-04-28 DIAGNOSIS — K219 Gastro-esophageal reflux disease without esophagitis: Secondary | ICD-10-CM

## 2018-04-28 MED ORDER — PANTOPRAZOLE SODIUM 20 MG PO TBEC: 20.0000 mg | DELAYED_RELEASE_TABLET | Freq: Every day | ORAL | 1 refills | Status: DC

## 2018-04-28 NOTE — Telephone Encounter (Signed)
Copied from [place] [id number]. Topic: Quick Communication - Rx Refill/Question >> Apr 28, 2018  2:08 PM [place], Laban Emperor wrote: Medication: pantoprazole (PROTONIX) 20 MG tablet [[id number]]   [facility] mail, states she needs to verify the pt medication.  [phone]   Agent: Please be advised that RX refills may take up to 3 business days. We ask that you follow-up with your pharmacy.

## 2018-05-01 MED ORDER — PANTOPRAZOLE SODIUM 20 MG PO TBEC: 20.0000 mg | DELAYED_RELEASE_TABLET | Freq: Every day | ORAL | 1 refills | Status: DC

## 2018-05-01 NOTE — Telephone Encounter (Signed)
Made the correction to patient's script. Send a new Rx was sent. It should stated 1 tablet daily. Also notify pharmacy.

## 2018-05-03 ENCOUNTER — Other Ambulatory Visit: Payer: Self-pay | Admitting: Primary Care

## 2018-05-03 DIAGNOSIS — E0800 Diabetes mellitus due to underlying condition with hyperosmolarity without nonketotic hyperglycemic-hyperosmolar coma (NKHHC): Secondary | ICD-10-CM

## 2018-05-03 DIAGNOSIS — I10 Essential (primary) hypertension: Secondary | ICD-10-CM

## 2018-05-04 ENCOUNTER — Ambulatory Visit (INDEPENDENT_AMBULATORY_CARE_PROVIDER_SITE_OTHER): Payer: Medicare Other

## 2018-05-04 VITALS — BP 130/82 | HR 66 | Temp 98.3°F | Ht 62.5 in | Wt 153.2 lb

## 2018-05-04 DIAGNOSIS — E0800 Diabetes mellitus due to underlying condition with hyperosmolarity without nonketotic hyperglycemic-hyperosmolar coma (NKHHC): Secondary | ICD-10-CM

## 2018-05-04 DIAGNOSIS — Z0000 Encounter for general adult medical examination without abnormal findings: Secondary | ICD-10-CM | POA: Diagnosis not present

## 2018-05-04 DIAGNOSIS — I10 Essential (primary) hypertension: Secondary | ICD-10-CM | POA: Diagnosis not present

## 2018-05-04 LAB — COMPREHENSIVE METABOLIC PANEL
ALT: 17 U/L (ref 0–35)
AST: 17 U/L (ref 0–37)
Albumin: 4.4 g/dL (ref 3.5–5.2)
Alkaline Phosphatase: 57 U/L (ref 39–117)
BUN: 20 mg/dL (ref 6–23)
CO2: 30 mEq/L (ref 19–32)
Calcium: 10.3 mg/dL (ref 8.4–10.5)
Chloride: 100 mEq/L (ref 96–112)
Creatinine, Ser: 0.98 mg/dL (ref 0.40–1.20)
GFR: 59.99 mL/min — ABNORMAL LOW (ref >60.00)
Glucose, Bld: 108 mg/dL — ABNORMAL HIGH (ref 70–99)
Potassium: 4.2 mEq/L (ref 3.5–5.1)
Sodium: 137 mEq/L (ref 135–145)
Total Bilirubin: 1.1 mg/dL (ref 0.2–1.2)
Total Protein: 7.2 g/dL (ref 6.0–8.3)

## 2018-05-04 LAB — HEMOGLOBIN A1C: Hgb A1c MFr Bld: 6.4 % (ref 4.6–6.5)

## 2018-05-04 NOTE — Progress Notes (Signed)
Subjective:   Brenda Contreras is a 67 y.o. female who presents for Medicare Annual (Subsequent) preventive examination.  Review of Systems:  N/A Cardiac Risk Factors include: advanced age (>27men, >72 women);diabetes mellitus;dyslipidemia;hypertension     Objective:     Vitals: BP 130/82 (BP Location: Right Arm, Patient Position: Sitting, Cuff Size: Normal)   Pulse 66   Temp 98.3 F (36.8 C) (Oral)   Ht 5' 2.5" (1.588 m) Comment: no shoes  Wt 153 lb 4 oz (69.5 kg)   SpO2 98%   BMI 27.58 kg/m   Body mass index is 27.58 kg/m.  Advanced Directives 05/04/2018 01/28/2018  Does Patient Have a Medical Advance Directive? No No  Would patient like information on creating a medical advance directive? Yes (MAU/Ambulatory/Procedural Areas - Information given) Yes (ED - Information included in AVS)    Tobacco Social History   Tobacco Use  Smoking Status Never Smoker  Smokeless Tobacco Never Used     Counseling given: No   Clinical Intake:  Pre-visit preparation completed: Yes  Pain : No/denies pain Pain Score: 0-No pain     Nutritional Status: BMI 25 -29 Overweight Nutritional Risks: None Diabetes: No  How often do you need to have someone help you when you read instructions, pamphlets, or other written materials from your doctor or pharmacy?: 1 - Never What is the last grade level you completed in school?: 12th grade  Interpreter Needed?: No  Comments: pt lives with spouse Information entered by :: LPinson, LPN  Past Medical History:  Diagnosis Date  . Diabetes mellitus   . Dizziness   . GERD (gastroesophageal reflux disease)   . High cholesterol   . Hypertension   . Vaginal dryness    Past Surgical History:  Procedure Laterality Date  . APPENDECTOMY    . BREAST BIOPSY    . TUBAL LIGATION     Family History  Problem Relation Age of Onset  . Diabetes Father   . Hypertension Father   . Hyperlipidemia Father   . Stroke Father   . Diabetes Sister   .  Hyperlipidemia Sister   . Hypertension Sister   . Lung cancer Paternal Uncle   . Diabetes Paternal Grandmother   . Heart disease Paternal Grandfather    Social History   Socioeconomic History  . Marital status: Married    Spouse name: Not on file  . Number of children: Not on file  . Years of education: Not on file  . Highest education level: Not on file  Occupational History  . Not on file  Social Needs  . Financial resource strain: Not on file  . Food insecurity:    Worry: Not on file    Inability: Not on file  . Transportation needs:    Medical: Not on file    Non-medical: Not on file  Tobacco Use  . Smoking status: Never Smoker  . Smokeless tobacco: Never Used  Substance and Sexual Activity  . Alcohol use: No  . Drug use: No  . Sexual activity: Yes  Lifestyle  . Physical activity:    Days per week: Not on file    Minutes per session: Not on file  . Stress: Not on file  Relationships  . Social connections:    Talks on phone: Not on file    Gets together: Not on file    Attends religious service: Not on file    Active member of club or organization: Not on file  Attends meetings of clubs or organizations: Not on file    Relationship status: Not on file  Other Topics Concern  . 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      Has two children and two grandchildren.      Does not have a living will.  Desires CPR, would not want prolonged life support if futile.    Outpatient Encounter Medications as of 05/04/2018  Medication Sig  . amLODipine (NORVASC) 5 MG tablet Take 1 tablet (5 mg total) by mouth daily.  Marland Kitchen aspirin EC 81 MG tablet Take 81 mg by mouth daily.  . carvedilol (COREG) 6.25 MG tablet Take 1 tablet (6.25 mg total) by mouth at bedtime.  . dapagliflozin propanediol (FARXIGA) 5 MG TABS tablet Take 5 mg by mouth daily.  Marland Kitchen olmesartan-hydrochlorothiazide (BENICAR HCT) 40-25 MG tablet Take 1 tablet by mouth daily.  .  pantoprazole (PROTONIX) 20 MG tablet Take 1 tablet (20 mg total) by mouth daily.  . rosuvastatin (CRESTOR) 20 MG tablet Take 1 tablet (20 mg total) by mouth at bedtime.  . sitaGLIPtin-metformin (JANUMET) 50-1000 MG tablet Take 1 tablet by mouth 2 (two) times daily with a meal.   No facility-administered encounter medications on file as of 05/04/2018.     Activities of Daily Living In your present state of health, do you have any difficulty performing the following activities: 05/04/2018  Hearing? N  Vision? N  Difficulty concentrating or making decisions? Y  Walking or climbing stairs? N  Dressing or bathing? N  Doing errands, shopping? N  Preparing Food and eating ? N  Using the Toilet? N  In the past six months, have you accidently leaked urine? N  Do you have problems with loss of bowel control? N  Managing your Medications? N  Managing your Finances? N  Housekeeping or managing your Housekeeping? N  Some recent data might be hidden    Patient Care Team: Pleas Koch, NP as PCP - General (Internal Medicine) Adrian Prows, MD as Consulting Physician (Cardiology) Marylynn Pearson, MD as Consulting Physician (Obstetrics and Gynecology) Warden Fillers, MD as Consulting Physician (Ophthalmology)    Assessment:   This is a routine wellness examination for Brenda Contreras.   Hearing Screening   125Hz  250Hz  500Hz  1000Hz  2000Hz  3000Hz  4000Hz  6000Hz  8000Hz   Right ear:   40 40 40  40    Left ear:   40 40 40  40    Vision Screening Comments: Last vision exam in May 2018   Exercise Activities and Dietary recommendations Current Exercise Habits: The patient does not participate in regular exercise at present, Exercise limited by: None identified  Goals    . Patient Stated     Starting 05/04/2018, I will attempt to drink at least 6-8 glasses of water daily.        Fall Risk Fall Risk  05/04/2018 04/25/2017 04/20/2016  Falls in the past year? No No No  *  Depression Screen PHQ 2/9  Scores 05/04/2018 04/25/2017 04/20/2016  PHQ - 2 Score 0 0 0  PHQ- 9 Score 0 - -     Cognitive Function MMSE - Mini Mental State Exam 05/04/2018  Orientation to time 5  Orientation to Place 5  Registration 3  Attention/ Calculation 0  Recall 3  Language- name 2 objects 0  Language- repeat 1  Language- follow 3 step command 3  Language- read & follow direction 0  Write a sentence  0  Copy design 0  Total score 20     PLEASE NOTE: A Mini-Cog screen was completed. Maximum score is 20. A value of 0 denotes this part of Folstein MMSE was not completed or the patient failed this part of the Mini-Cog screening.   Mini-Cog Screening Orientation to Time - Max 5 pts Orientation to Place - Max 5 pts Registration - Max 3 pts Recall - Max 3 pts Language Repeat - Max 1 pts Language Follow 3 Step Command - Max 3 pts     Immunization History  Administered Date(s) Administered  . Influenza,inj,Quad PF,6+ Mos 09/30/2016, 09/15/2017  . Pneumococcal Conjugate-13 04/20/2016  . Pneumococcal Polysaccharide-23 04/25/2017  . Tdap 04/26/2016  . Zoster 07/12/2016    Screening Tests Health Maintenance  Topic Date Due  . FOOT EXAM  05/15/2018 (Originally 04/27/2018)  . OPHTHALMOLOGY EXAM  05/24/2018  . INFLUENZA VACCINE  07/27/2018  . HEMOGLOBIN A1C  11/04/2018  . MAMMOGRAM  04/27/2020  . COLONOSCOPY  01/16/2024  . TETANUS/TDAP  04/26/2026  . DEXA SCAN  Completed  . Hepatitis C Screening  Completed  . PNA vac Low Risk Adult  Completed        Plan:     I have personally reviewed, addressed, and noted the following in the patient's chart:  A. Medical and social history B. Use of alcohol, tobacco or illicit drugs  C. Current medications and supplements D. Functional ability and status E.  Nutritional status F.  Physical activity G. Advance directives H. List of other physicians I.  Hospitalizations, surgeries, and ER visits in previous 12 months J.  [place] to include  hearing, vision, cognitive, depression L. Referrals and appointments - none  In addition, I have reviewed and discussed with patient certain preventive protocols, quality metrics, and best practice recommendations. A written personalized care plan for preventive services as well as general preventive health recommendations were provided to patient.  See attached scanned questionnaire for additional information.   Signed,   Lindell Noe, MHA, BS, LPN Health Coach

## 2018-05-04 NOTE — Progress Notes (Signed)
PCP notes:   Health maintenance:  Foot exam - PCP please address at next appt A1C - completed  Abnormal screenings:   None  Patient concerns:   None  Nurse concerns:  None  Next PCP appt:   05/15/18 @ 0815

## 2018-05-04 NOTE — Patient Instructions (Addendum)
Brenda Contreras , Thank you for taking time to come for your Medicare Wellness Visit. I appreciate your ongoing commitment to your health goals. Please review the following plan we discussed and let me know if I can assist you in the future.   These are the goals we discussed: Goals    . Patient Stated     Starting 05/04/2018, I will attempt to drink at least 6-8 glasses of water daily.        This is a list of the screening recommended for you and due dates:  Health Maintenance  Topic Date Due  . Complete foot exam   05/15/2018*  . Eye exam for diabetics  05/24/2018  . Flu Shot  07/27/2018  . Hemoglobin A1C  11/04/2018  . Mammogram  04/27/2020  . Colon Cancer Screening  01/16/2024  . Tetanus Vaccine  04/26/2026  . DEXA scan (bone density measurement)  Completed  .  Hepatitis C: One time screening is recommended by Center for Disease Control  (CDC) for  adults born from [birth year] through 1966.   Completed  . Pneumonia vaccines  Completed  *Topic was postponed. The date shown is not the original due date.   Preventive Care for Adults  A healthy lifestyle and preventive care can promote health and wellness. Preventive health guidelines for adults include the following key practices.  . A routine yearly physical is a good way to check with your health care provider about your health and preventive screening. It is a chance to share any concerns and updates on your health and to receive a thorough exam.  . Visit your dentist for a routine exam and preventive care every 6 months. Brush your teeth twice a day and floss once a day. Good oral hygiene prevents tooth decay and gum disease.  . The frequency of eye exams is based on your age, health, family medical history, use  of contact lenses, and other factors. Follow your health care provider's recommendations for frequency of eye exams.  . Eat a healthy diet. Foods like vegetables, fruits, whole grains, low-fat dairy products, and lean protein  foods contain the nutrients you need without too many calories. Decrease your intake of foods high in solid fats, added sugars, and salt. Eat the right amount of calories for you. Get information about a proper diet from your health care provider, if necessary.  . Regular physical exercise is one of the most important things you can do for your health. Most adults should get at least 150 minutes of moderate-intensity exercise (any activity that increases your heart rate and causes you to sweat) each week. In addition, most adults need muscle-strengthening exercises on 2 or more days a week.  Silver Sneakers may be a benefit available to you. To determine eligibility, you may visit the website: www.silversneakers.com or contact program at (901)379-6197 Mon-Fri between 8AM-8PM.   . Maintain a healthy weight. The body mass index (BMI) is a screening tool to identify possible weight problems. It provides an estimate of body fat based on height and weight. Your health care provider can find your BMI and can help you achieve or maintain a healthy weight.   For adults 20 years and older: ? A BMI below 18.5 is considered underweight. ? A BMI of 18.5 to 24.9 is normal. ? A BMI of 25 to 29.9 is considered overweight. ? A BMI of 30 and above is considered obese.   . Maintain normal blood lipids and cholesterol levels by exercising  and minimizing your intake of saturated fat. Eat a balanced diet with plenty of fruit and vegetables. Blood tests for lipids and cholesterol should begin at age 35 and be repeated every 5 years. If your lipid or cholesterol levels are high, you are over 50, or you are at high risk for heart disease, you may need your cholesterol levels checked more frequently. Ongoing high lipid and cholesterol levels should be treated with medicines if diet and exercise are not working.  . If you smoke, find out from your health care provider how to quit. If you do not use tobacco, please do not  start.  . If you choose to drink alcohol, please do not consume more than 2 drinks per day. One drink is considered to be 12 ounces (355 mL) of beer, 5 ounces (148 mL) of wine, or 1.5 ounces (44 mL) of liquor.  . If you are 29-37 years old, ask your health care provider if you should take aspirin to prevent strokes.  . Use sunscreen. Apply sunscreen liberally and repeatedly throughout the day. You should seek shade when your shadow is shorter than you. Protect yourself by wearing long sleeves, pants, a wide-brimmed hat, and sunglasses year round, whenever you are outdoors.  . Once a month, do a whole body skin exam, using a mirror to look at the skin on your back. Tell your health care provider of new moles, moles that have irregular borders, moles that are larger than a pencil eraser, or moles that have changed in shape or color.

## 2018-05-07 NOTE — Progress Notes (Signed)
I reviewed health advisor's note, was available for consultation, and agree with documentation and plan.  

## 2018-05-10 ENCOUNTER — Other Ambulatory Visit: Payer: Medicare Other

## 2018-05-12 ENCOUNTER — Encounter: Payer: Medicare Other | Admitting: Primary Care

## 2018-05-15 ENCOUNTER — Encounter: Payer: Self-pay | Admitting: Primary Care

## 2018-05-15 ENCOUNTER — Ambulatory Visit (INDEPENDENT_AMBULATORY_CARE_PROVIDER_SITE_OTHER): Payer: Medicare Other | Admitting: Primary Care

## 2018-05-15 VITALS — BP 142/74 | HR 73 | Temp 98.7°F | Ht 62.5 in | Wt 153.2 lb

## 2018-05-15 DIAGNOSIS — E785 Hyperlipidemia, unspecified: Secondary | ICD-10-CM

## 2018-05-15 DIAGNOSIS — Z1211 Encounter for screening for malignant neoplasm of colon: Secondary | ICD-10-CM

## 2018-05-15 DIAGNOSIS — E119 Type 2 diabetes mellitus without complications: Secondary | ICD-10-CM

## 2018-05-15 DIAGNOSIS — K219 Gastro-esophageal reflux disease without esophagitis: Secondary | ICD-10-CM

## 2018-05-15 DIAGNOSIS — I10 Essential (primary) hypertension: Secondary | ICD-10-CM

## 2018-05-15 NOTE — Patient Instructions (Addendum)
You will be contacted regarding your referral to GI for the colonoscopy.  Please let us know if you have not been contacted within one week.   Continue exercising. You should be getting 150 minutes of moderate intensity exercise weekly.  It is important that you improve your diet. Please limit carbohydrates in the form of white bread, rice, pasta, sweets, fast food, fried food, sugary drinks, etc. Increase your consumption of fresh fruits and vegetables, whole grains, lean protein.  Ensure you are consuming 64 ounces of water daily.  Follow up with your eye doctor as scheduled.  Please schedule a follow up appointment in 6 months for diabetes and kidney check.  It was a pleasure to see you today!   Preventive Care 68 Years and Older, Female Preventive care refers to lifestyle choices and visits with your health care provider that can promote health and wellness. What does preventive care include?  A yearly physical exam. This is also called an annual well check.  Dental exams once or twice a year.  Routine eye exams. Ask your health care provider how often you should have your eyes checked.  Personal lifestyle choices, including: ? Daily care of your teeth and gums. ? Regular physical activity. ? Eating a healthy diet. ? Avoiding tobacco and drug use. ? Limiting alcohol use. ? Practicing safe sex. ? Taking low-dose aspirin every day. ? Taking vitamin and mineral supplements as recommended by your health care provider. What happens during an annual well check? The services and screenings done by your health care provider during your annual well check will depend on your age, overall health, lifestyle risk factors, and family history of disease. Counseling Your health care provider may ask you questions about your:  Alcohol use.  Tobacco use.  Drug use.  Emotional well-being.  Home and relationship well-being.  Sexual activity.  Eating habits.  History of  falls.  Memory and ability to understand (cognition).  Work and work Statistician.  Reproductive health.  Screening You may have the following tests or measurements:  Height, weight, and BMI.  Blood pressure.  Lipid and cholesterol levels. These may be checked every 5 years, or more frequently if you are over 24 years old.  Skin check.  Lung cancer screening. You may have this screening every year starting at age 68 if you have a 30-pack-year history of smoking and currently smoke or have quit within the past 15 years.  Fecal occult blood test (FOBT) of the stool. You may have this test every year starting at age 68.  Flexible sigmoidoscopy or colonoscopy. You may have a sigmoidoscopy every 5 years or a colonoscopy every 10 years starting at age 68.  Hepatitis C blood test.  Hepatitis B blood test.  Sexually transmitted disease (STD) testing.  Diabetes screening. This is done by checking your blood sugar (glucose) after you have not eaten for a while (fasting). You may have this done every 1-3 years.  Bone density scan. This is done to screen for osteoporosis. You may have this done starting at age 68.  Mammogram. This may be done every 1-2 years. Talk to your health care provider about how often you should have regular mammograms.  Talk with your health care provider about your test results, treatment options, and if necessary, the need for more tests. Vaccines Your health care provider may recommend certain vaccines, such as:  Influenza vaccine. This is recommended every year.  Tetanus, diphtheria, and acellular pertussis (Tdap, Td) vaccine. You may  need a Td booster every 10 years.  Varicella vaccine. You may need this if you have not been vaccinated.  Zoster vaccine. You may need this after age 68.  Measles, mumps, and rubella (MMR) vaccine. You may need at least one dose of MMR if you were born in 1957 or later. You may also need a second dose.  Pneumococcal  13-valent conjugate (PCV13) vaccine. One dose is recommended after age 68.  Pneumococcal polysaccharide (PPSV23) vaccine. One dose is recommended after age 68.  Meningococcal vaccine. You may need this if you have certain conditions.  Hepatitis A vaccine. You may need this if you have certain conditions or if you travel or work in places where you may be exposed to hepatitis A.  Hepatitis B vaccine. You may need this if you have certain conditions or if you travel or work in places where you may be exposed to hepatitis B.  Haemophilus influenzae type b (Hib) vaccine. You may need this if you have certain conditions.  Talk to your health care provider about which screenings and vaccines you need and how often you need them. This information is not intended to replace advice given to you by your health care provider. Make sure you discuss any questions you have with your health care provider. Document Released: 01/09/2016 Document Revised: 09/01/2016 Document Reviewed: 10/14/2015 Elsevier Interactive Patient Education  [employer].

## 2018-05-15 NOTE — Assessment & Plan Note (Addendum)
Borderline today, stable during her visit 2 weeks ago. Continue current regimen. BMP unremarkable. Continue to monitor.

## 2018-05-15 NOTE — Assessment & Plan Note (Signed)
Recent A1C stable. Continue oral medications.  Managed on ARB and statin. Pneumonia vaccinations UTD. Eye exam scheduled for next week. Foot exam today.  Discussed to make improvements in diet, examples provided. Follow up in 6 months for re-evaluation.

## 2018-05-15 NOTE — Assessment & Plan Note (Signed)
Doing well on pantoprazole, continue same. 

## 2018-05-15 NOTE — Assessment & Plan Note (Signed)
Lipids stable from February 2019, continue Crestor.

## 2018-05-15 NOTE — Progress Notes (Signed)
Subjective:    Patient ID: Brenda Contreras, female    DOB: 18-Dec-1951, 67 y.o.   MRN: [id number]  HPI  Ms. Brenda Contreras is a 67 year old female who presents today for [place] Part 2. She saw our health advisor on 05/04/18. She has no complaints today.  1) Essential Hypertension: Currently managed on amlodipine 5 mg, carvedilol 6.25 mg once daily, olmesartan-HCTZ 40-25 mg. She is not checking her BP at home. She denies chest pain, dizziness, shortness of breath.   BP Readings from Last 3 Encounters:  05/15/18 (!) 142/74  05/04/18 130/82  03/14/18 (!) 144/78   2) Type 2 Diabetes:  Current medications include: sitagliptin-metformin 50-1000 mg, Farxiga 5 mg.   She is checking her blood glucose 2 times daily and is getting readings of: AM fasting: 130-150's Before dinner: 130-140's  Last A1C: 6.4  Last Eye Exam: Due later this month Last Foot Exam: Due Pneumonia Vaccination: Completed ACE/ARB: ARB Statin: Crestor  Diet currently consists of:  Breakfast: Cereal  Lunch: Sandwich Dinner: Restaurants (fried seafood, vegetables) mostly Snacks: Yogurt, fruit Desserts: Low carb ice cream, nightly Beverages: Water, coffee  Exercise: She is not currently exercising.     Immunizations: -Tetanus: Completed in 2017 -Influenza: Completed last season  -Pneumonia: Completed  -Shingles: Completed Zostavax in 2017  Colonoscopy: Completed in 2014, due now Dexa: Follows with GYN Mammogram: Follows with GYN, completed in May 2019 Hep C Screen: Completed in 2017     Review of Systems  Constitutional: Negative for unexpected weight change.  HENT: Negative for rhinorrhea.   Respiratory: Negative for cough and shortness of breath.   Cardiovascular: Negative for chest pain.  Gastrointestinal: Negative for constipation and diarrhea.  Genitourinary: Negative for difficulty urinating and menstrual problem.  Musculoskeletal: Negative for arthralgias and myalgias.  Skin: Negative for rash.    Allergic/Immunologic: Negative for environmental allergies.  Neurological: Negative for dizziness, numbness and headaches.  Psychiatric/Behavioral: The patient is not nervous/anxious.        Past Medical History:  Diagnosis Date  . Diabetes mellitus   . Dizziness   . GERD (gastroesophageal reflux disease)   . High cholesterol   . Hypertension   . Vaginal dryness      Social History   Socioeconomic History  . Marital status: Married    Spouse name: Not on file  . Number of children: Not on file  . Years of education: Not on file  . Highest education level: Not on file  Occupational History  . Not on file  Social Needs  . Financial resource strain: Not on file  . Food insecurity:    Worry: Not on file    Inability: Not on file  . Transportation needs:    Medical: Not on file    Non-medical: Not on file  Tobacco Use  . Smoking status: Never Smoker  . Smokeless tobacco: Never Used  Substance and Sexual Activity  . Alcohol use: No  . Drug use: No  . Sexual activity: Yes  Lifestyle  . Physical activity:    Days per week: Not on file    Minutes per session: Not on file  . Stress: Not on file  Relationships  . Social connections:    Talks on phone: Not on file    Gets together: Not on file    Attends religious service: Not on file    Active member of club or organization: Not on file    Attends meetings of clubs or organizations: Not  on file    Relationship status: Not on file  . Intimate partner violence:    Fear of current or ex partner: Not on file    Emotionally abused: Not on file    Physically abused: Not on file    Forced sexual activity: Not on file  Other Topics Concern  . 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      Has two children and two grandchildren.      Does not have a living will.  Desires CPR, would not want prolonged life support if futile.    Past Surgical History:  Procedure Laterality Date  .  APPENDECTOMY    . BREAST BIOPSY    . TUBAL LIGATION      Family History  Problem Relation Age of Onset  . Diabetes Father   . Hypertension Father   . Hyperlipidemia Father   . Stroke Father   . Diabetes Sister   . Hyperlipidemia Sister   . Hypertension Sister   . Lung cancer Paternal Uncle   . Diabetes Paternal Grandmother   . Heart disease Paternal Grandfather     No Known Allergies  Current Outpatient Medications on File Prior to Visit  Medication Sig Dispense Refill  . amLODipine (NORVASC) 5 MG tablet Take 1 tablet (5 mg total) by mouth daily. 90 tablet 3  . aspirin EC 81 MG tablet Take 81 mg by mouth daily.    . carvedilol (COREG) 6.25 MG tablet Take 1 tablet (6.25 mg total) by mouth at bedtime. 90 tablet 3  . dapagliflozin propanediol (FARXIGA) 5 MG TABS tablet Take 5 mg by mouth daily. 90 tablet 1  . olmesartan-hydrochlorothiazide (BENICAR HCT) 40-25 MG tablet Take 1 tablet by mouth daily. 90 tablet 3  . pantoprazole (PROTONIX) 20 MG tablet Take 1 tablet (20 mg total) by mouth daily. 90 tablet 1  . rosuvastatin (CRESTOR) 20 MG tablet Take 1 tablet (20 mg total) by mouth at bedtime. 90 tablet 3  . sitaGLIPtin-metformin (JANUMET) 50-1000 MG tablet Take 1 tablet by mouth 2 (two) times daily with a meal. 180 tablet 1   No current facility-administered medications on file prior to visit.     BP (!) 142/74   Pulse 73   Temp 98.7 F (37.1 C) (Oral)   Ht 5' 2.5" (1.588 m)   Wt 153 lb 4 oz (69.5 kg)   SpO2 98%   BMI 27.58 kg/m    Objective:   Physical Exam  Constitutional: She is oriented to person, place, and time. She appears well-nourished.  HENT:  Right Ear: Tympanic membrane and ear canal normal.  Left Ear: Tympanic membrane and ear canal normal.  Nose: Nose normal.  Mouth/Throat: Oropharynx is clear and moist.  Eyes: Pupils are equal, round, and reactive to light. Conjunctivae and EOM are normal.  Neck: Neck supple. No thyromegaly present.  Cardiovascular:  Normal rate and regular rhythm.  No murmur heard. Pulmonary/Chest: Effort normal and breath sounds normal. She has no rales.  Abdominal: Soft. Bowel sounds are normal. There is no tenderness.  Musculoskeletal: Normal range of motion.  Lymphadenopathy:    She has no cervical adenopathy.  Neurological: She is alert and oriented to person, place, and time. She has normal reflexes. No cranial nerve deficit.  Skin: Skin is warm and dry. No rash noted.  Psychiatric: She has a normal mood and affect.          Assessment &  Plan:

## 2018-05-18 DIAGNOSIS — Z1211 Encounter for screening for malignant neoplasm of colon: Secondary | ICD-10-CM | POA: Diagnosis not present

## 2018-05-18 DIAGNOSIS — K5730 Diverticulosis of large intestine without perforation or abscess without bleeding: Secondary | ICD-10-CM | POA: Diagnosis not present

## 2018-05-18 DIAGNOSIS — Z800 Family history of malignant neoplasm of digestive organs: Secondary | ICD-10-CM | POA: Diagnosis not present

## 2018-05-24 DIAGNOSIS — E119 Type 2 diabetes mellitus without complications: Secondary | ICD-10-CM | POA: Diagnosis not present

## 2018-05-24 DIAGNOSIS — H25813 Combined forms of age-related cataract, bilateral: Secondary | ICD-10-CM | POA: Diagnosis not present

## 2018-05-24 DIAGNOSIS — H5703 Miosis: Secondary | ICD-10-CM | POA: Diagnosis not present

## 2018-05-24 LAB — HM DIABETES EYE EXAM

## 2018-05-26 ENCOUNTER — Ambulatory Visit: Payer: Self-pay | Admitting: *Deleted

## 2018-05-26 ENCOUNTER — Emergency Department (HOSPITAL_COMMUNITY): Payer: Medicare Other

## 2018-05-26 ENCOUNTER — Emergency Department (HOSPITAL_COMMUNITY)
Admission: EM | Admit: 2018-05-26 | Discharge: 2018-05-26 | Disposition: A | Discharge status: Home / Self Care | Payer: Medicare Other | Attending: Emergency Medicine | Admitting: Emergency Medicine

## 2018-05-26 ENCOUNTER — Encounter (HOSPITAL_COMMUNITY): Payer: Self-pay

## 2018-05-26 ENCOUNTER — Other Ambulatory Visit: Payer: Self-pay

## 2018-05-26 DIAGNOSIS — E119 Type 2 diabetes mellitus without complications: Secondary | ICD-10-CM | POA: Diagnosis not present

## 2018-05-26 DIAGNOSIS — Z79899 Other long term (current) drug therapy: Secondary | ICD-10-CM | POA: Diagnosis not present

## 2018-05-26 DIAGNOSIS — I10 Essential (primary) hypertension: Secondary | ICD-10-CM | POA: Insufficient documentation

## 2018-05-26 DIAGNOSIS — R079 Chest pain, unspecified: Secondary | ICD-10-CM | POA: Diagnosis not present

## 2018-05-26 DIAGNOSIS — Z7984 Long term (current) use of oral hypoglycemic drugs: Secondary | ICD-10-CM | POA: Insufficient documentation

## 2018-05-26 DIAGNOSIS — R0789 Other chest pain: Secondary | ICD-10-CM | POA: Diagnosis not present

## 2018-05-26 DIAGNOSIS — R0602 Shortness of breath: Secondary | ICD-10-CM | POA: Diagnosis not present

## 2018-05-26 DIAGNOSIS — Z7982 Long term (current) use of aspirin: Secondary | ICD-10-CM | POA: Diagnosis not present

## 2018-05-26 LAB — D-DIMER, QUANTITATIVE (NOT AT ARMC): D-Dimer, Quant: 0.38 ug/mL-FEU (ref 0.00–0.50)

## 2018-05-26 LAB — CBC
HCT: 39.4 % (ref 36.0–46.0)
Hemoglobin: 12.3 g/dL (ref 12.0–15.0)
MCH: 27.7 pg (ref 26.0–34.0)
MCHC: 31.2 g/dL (ref 30.0–36.0)
MCV: 88.7 fL (ref 78.0–100.0)
Platelets: 215 10*3/uL (ref 150–400)
RBC: 4.44 MIL/uL (ref 3.87–5.11)
RDW: 14.7 % (ref 11.5–15.5)
WBC: 8.3 10*3/uL (ref 4.0–10.5)

## 2018-05-26 LAB — I-STAT TROPONIN, ED
Troponin i, poc: 0 ng/mL (ref 0.00–0.08)
Troponin i, poc: 0 ng/mL (ref 0.00–0.08)

## 2018-05-26 LAB — BASIC METABOLIC PANEL
Anion gap: 8 (ref 5–15)
BUN: 24 mg/dL — ABNORMAL HIGH (ref 6–20)
CO2: 27 mmol/L (ref 22–32)
Calcium: 10.4 mg/dL — ABNORMAL HIGH (ref 8.9–10.3)
Chloride: 103 mmol/L (ref 101–111)
Creatinine, Ser: 1.08 mg/dL — ABNORMAL HIGH (ref 0.44–1.00)
GFR calc Af Amer: 60 mL/min — ABNORMAL LOW (ref >60)
GFR calc non Af Amer: 52 mL/min — ABNORMAL LOW (ref >60)
Glucose, Bld: 140 mg/dL — ABNORMAL HIGH (ref 65–99)
Potassium: 4.2 mmol/L (ref 3.5–5.1)
Sodium: 138 mmol/L (ref 135–145)

## 2018-05-26 MED ORDER — KETOROLAC TROMETHAMINE 60 MG/2ML IM SOLN
30.0000 mg | Freq: Once | INTRAMUSCULAR | Status: AC
  Administered 2018-05-26: 30 mg via INTRAMUSCULAR
  Filled 2018-05-26: qty 2

## 2018-05-26 NOTE — Telephone Encounter (Signed)
Called in c/o chest pain in center of chest that is constant and a 7 on the pain scale.  "It feels different than the acid reflux I was diagnosed with a few weeks ago".    The medication she prescribed for me and Tums is not helping the chest pain at all. Her family history consists of her twin sister having heart problems along with her Dad and both grandmothers.  See triage notes below.  I have referred her to the ED.   She is going to [facility] ED now.  I routed a note to Alma Friendly, AGNP-C to make her aware of the ED referral.    Reason for Disposition . [1] Chest pain lasts > 5 minutes AND [2] age > 35  Answer Assessment - Initial Assessment Questions 1. LOCATION: "Where does it hurt?"       Pain in center of my chest.   Worse if I bend over.   It's like a pressure feeling when I bend over.   I feel like I'm not getting enough air.   I have a cough also that hurts when I cough.   Been coughing a couple of weeks.   I cough up stuff but I don't know what color it is.   My nose runs all the time but it's clear. 2. RADIATION: "Does the pain go anywhere else?" (e.g., into neck, jaw, arms, back)     Sometimes it's over to the left side of my chest and into my shoulder and upper part of my back. 3. ONSET: "When did the chest pain begin?" (Minutes, hours or days)      I've been hurting for a week that seems to be getting worse rather than easing off. 4. PATTERN "Does the pain come and go, or has it been constant since it started?"  "Does it get worse with exertion?"      It's constant.  I'm eating Tums like crazy.   It doesn't help. 5. DURATION: "How long does it last" (e.g., seconds, minutes, hours)     It's constant.   I have to take a deep breath sometimes because I don't feel like I'm getting enough air.  I can't sleep good at night because it hurts and I can't get comfortable.    I broke out in a sweat last night.   Denies nausea. 6. SEVERITY: "How bad is the pain?"  (e.g., Scale  1-10; mild, moderate, or severe)    - MILD (1-3): doesn't interfere with normal activities     - MODERATE (4-7): interferes with normal activities or awakens from sleep    - SEVERE (8-10): excruciating pain, unable to do any normal activities       Not taking any OTC medications for allergies or congestion. 7 on pain scale that is constant. 7. CARDIAC RISK FACTORS: "Do you have any history of heart problems or risk factors for heart disease?" (e.g., prior heart attack, angina; high blood pressure, diabetes, being overweight, high cholesterol, smoking, or strong family history of heart disease)     I have hypertension,diabetic non insulin, high cholesterol, not a smoker, I have a twin sister that has heart problems also my Dad and both of my grandmothers. 8. PULMONARY RISK FACTORS: "Do you have any history of lung disease?"  (e.g., blood clots in lung, asthma, emphysema, birth control pills)     Denies all of above. 9. CAUSE: "What do you think is causing the chest pain?"  I was concerned it was a heart issue. 10. OTHER SYMPTOMS: "Do you have any other symptoms?" (e.g., dizziness, nausea, vomiting, sweating, fever, difficulty breathing, cough)       See above  I feel this pain is different.   The medication she prescribed for me for the indigestion is not helping either.   I was diagnosed with acid reflux a while back.    I still feel like something is going on in my chest.   11. PREGNANCY: "Is there any chance you are pregnant?" "When was your last menstrual period?"       Not asked due to age.  Protocols used: CHEST PAIN-A-AH

## 2018-05-26 NOTE — ED Notes (Signed)
ED Provider at bedside. 

## 2018-05-26 NOTE — ED Triage Notes (Signed)
Patient complains of 4 days of increasing left anterior CP. Has cough with same, no associated symptoms. Alert and oriented, NAD. Non-smoker

## 2018-05-26 NOTE — ED Notes (Signed)
Lab stating they will add on d-dimer at this time.

## 2018-05-26 NOTE — Telephone Encounter (Signed)
Noted and agree with plan.

## 2018-05-26 NOTE — ED Provider Notes (Signed)
[facility] Provider Note   CSN: [id number] Arrival date & time: 05/26/18  5329     History   Chief Complaint No chief complaint on file.   HPI Brenda Contreras is a 67 y.o. female.  Patient is a 67 year old female with a history of hypertension, hyperlipidemia, GERD, diabetes who is presenting today with complaint of chest pain.  Patient states approximately 3 or 4 weeks ago she started noticing an intermittent pulling sensation in the middle and left part of her chest.  She saw her PCP for this and they felt that it was related to GERD.  They changed her medications which she has been taking regularly but states the symptoms are getting worse.  Approximately 3 weeks ago she developed URI symptoms with runny nose, cough and clear sputum.  She states that coughing, laughing and taking a deep breath can make the pain worse.  She describes it as a tight pulling sensation in the upper left portion of her chest.  Sometimes she will feel it in her shoulder but it does not constantly radiate there.  It was intermittent 3 weeks ago but over the last 4 days as it become more constant.  She will occasionally feel short of breath but it is not related to exertion or any particular timing.  She is continued to walk daily and states that the pain is no better or worse with walking.  She denies any unilateral leg swelling or pain, recent travel, immobilization or surgery.  She does not take estrogens.  She has no prior heart history but her twin sister with stents and a grandmother with open heart surgery but no one else in her family with heart issues.  She does not use tobacco, drugs or alcohol.  She denies a history of asthma.  No abdominal pain or reflux type feelings right now.  No nausea, vomiting or diarrhea.  The history is provided by the patient.    Past Medical History:  Diagnosis Date  . Diabetes mellitus   . Dizziness   . GERD (gastroesophageal reflux  disease)   . High cholesterol   . Hypertension   . Vaginal dryness     Patient Active Problem List   Diagnosis Date Noted  . Chest pain 02/21/2018  . Encounter for diabetic foot exam ([place]) 05/05/2017  . Medicare annual wellness visit, initial 04/25/2017  . HTN (hypertension) 01/13/2016  . GERD (gastroesophageal reflux disease) 01/13/2016  . Diabetes ([place]) 01/13/2016  . HLD (hyperlipidemia) 01/13/2016    Past Surgical History:  Procedure Laterality Date  . APPENDECTOMY    . BREAST BIOPSY    . TUBAL LIGATION       OB History   None      Home Medications    Prior to Admission medications   Medication Sig Start Date End Date Taking? Authorizing Provider  amLODipine (NORVASC) 5 MG tablet Take 1 tablet (5 mg total) by mouth daily. 03/03/18   Pleas Koch, NP  aspirin EC 81 MG tablet Take 81 mg by mouth daily.    [provider]  carvedilol (COREG) 6.25 MG tablet Take 1 tablet (6.25 mg total) by mouth at bedtime. 03/03/18   Pleas Koch, NP  dapagliflozin propanediol (FARXIGA) 5 MG TABS tablet Take 5 mg by mouth daily. 03/03/18   Pleas Koch, NP  olmesartan-hydrochlorothiazide (BENICAR HCT) 40-25 MG tablet Take 1 tablet by mouth daily. 03/03/18   Pleas Koch, NP  pantoprazole (  PROTONIX) 20 MG tablet Take 1 tablet (20 mg total) by mouth daily. 05/01/18   Pleas Koch, NP  rosuvastatin (CRESTOR) 20 MG tablet Take 1 tablet (20 mg total) by mouth at bedtime. 03/03/18 03/03/19  Pleas Koch, NP  sitaGLIPtin-metformin (JANUMET) 50-1000 MG tablet Take 1 tablet by mouth 2 (two) times daily with a meal. 03/03/18   Pleas Koch, NP    Family History Family History  Problem Relation Age of Onset  . Diabetes Father   . Hypertension Father   . Hyperlipidemia Father   . Stroke Father   . Diabetes Sister   . Hyperlipidemia Sister   . Hypertension Sister   . Lung cancer Paternal Uncle   . Diabetes Paternal Grandmother   . Heart disease Paternal  Grandfather     Social History Social History   Tobacco Use  . Smoking status: Never Smoker  . Smokeless tobacco: Never Used  Substance Use Topics  . Alcohol use: No  . Drug use: No     Allergies   Patient has no known allergies.   Review of Systems Review of Systems  All other systems reviewed and are negative.    Physical Exam Updated Vital Signs BP (!) 160/81   Pulse 67   Resp 19   SpO2 100%   Physical Exam  Constitutional: She is oriented to person, place, and time. She appears well-developed and well-nourished. No distress.  HENT:  Head: Normocephalic and atraumatic.  Mouth/Throat: Oropharynx is clear and moist.  Eyes: Pupils are equal, round, and reactive to light. Conjunctivae and EOM are normal.  Neck: Normal range of motion. Neck supple.  Cardiovascular: Normal rate, regular rhythm and intact distal pulses.  No murmur heard. Pulmonary/Chest: Effort normal and breath sounds normal. No respiratory distress. She has no wheezes. She has no rales. She exhibits no tenderness.  Abdominal: Soft. She exhibits no distension. There is no tenderness. There is no rebound and no guarding.  Musculoskeletal: Normal range of motion. She exhibits no edema or tenderness.  Neurological: She is alert and oriented to person, place, and time.  Skin: Skin is warm and dry. No rash noted. No erythema.  Psychiatric: She has a normal mood and affect. Her behavior is normal.  Nursing note and vitals reviewed.    ED Treatments / Results  Labs (all labs ordered are listed, but only abnormal results are displayed) Labs Reviewed  BASIC METABOLIC PANEL - Abnormal; Notable for the following components:      Result Value   Glucose, Bld 140 (*)    BUN 24 (*)    Creatinine, Ser 1.08 (*)    Calcium 10.4 (*)    GFR calc non Af Amer 52 (*)    GFR calc Af Amer 60 (*)    All other components within normal limits  CBC  D-DIMER, QUANTITATIVE (NOT AT [facility])  D-DIMER, QUANTITATIVE (NOT AT  [facility])  I-STAT TROPONIN, ED  I-STAT TROPONIN, ED    EKG EKG Interpretation  Date/Time:  Friday May 26 2018 09:56:58 EDT Ventricular Rate:  77 PR Interval:  152 QRS Duration: 72 QT Interval:  378 QTC Calculation: 427 R Axis:   62 Text Interpretation:  Normal sinus rhythm Normal ECG No significant change since last tracing Confirmed by Blanchie Dessert [phone]) on 05/26/2018 2:24:39 PM   Radiology Dg Chest 2 View  Result Date: 05/26/2018 CLINICAL DATA:  Central chest pain and intermittently radiating to the left. Some shortness of breath. EXAM: CHEST - 2  VIEW COMPARISON:  01/28/2018. FINDINGS: Normal sized heart. Clear lungs. Mild thoracic spine degenerative changes. IMPRESSION: No acute abnormality. Electronically Signed   By: Claudie Revering M.D.   On: 05/26/2018 10:16    Procedures Procedures (including critical care time)  Medications Ordered in ED Medications  ketorolac (TORADOL) injection 30 mg (30 mg Intramuscular Given 05/26/18 1601)     Initial Impression / Assessment and Plan / ED Course  I have reviewed the triage vital signs and the nursing notes.  Pertinent labs & imaging results that were available during my care of the patient were reviewed by me and considered in my medical decision making (see chart for details).     67 year old female presenting today with atypical chest pain.  Patient chest pain has been intermittent for the last 3 to 4 weeks but has become constant in the last 4 days.  Occasionally she feels like she needs to take a deep breath but she has had no other associated symptoms.  Saw PCP for this issue and they felt that it was GI related and changed her PPI.  However she states there is been no change.  She is tried taking Tums for this without any improvement.  It is not related to exertion.  Low suspicion for dissection, ACS (heart score 4 for age and risk factor), pneumonia.  Possible this could be GI related, pleurisy, low risk [place] but will do a  d-dimer to rule out PE.  Patient does not smoke and has low risk for lung cancer.  BMP, troponin and CBC without acute findings.  Chest x-ray within normal limits.  EKG is within normal limits without evidence of pericarditis or ACS.  D-dimer and delta trop pending.  5:36 PM D-dimer and delta troponin without acute findings.  Will discharge patient home with atypical chest pain.  Feel most likely musculoskeletal or pleurisy.  Recommended that patient follow-up with her PCP and may need stress test in the future.  Final Clinical Impressions(s) / ED Diagnoses   Final diagnoses:  Atypical chest pain    ED Discharge Orders    None       Blanchie Dessert, MD 05/26/18 1739

## 2018-05-30 ENCOUNTER — Encounter: Payer: Self-pay | Admitting: Primary Care

## 2018-06-05 ENCOUNTER — Ambulatory Visit (INDEPENDENT_AMBULATORY_CARE_PROVIDER_SITE_OTHER): Payer: Medicare Other | Admitting: Primary Care

## 2018-06-05 VITALS — BP 140/70 | HR 76 | Temp 98.1°F | Ht 62.5 in | Wt 154.5 lb

## 2018-06-05 DIAGNOSIS — R1012 Left upper quadrant pain: Secondary | ICD-10-CM | POA: Diagnosis not present

## 2018-06-05 DIAGNOSIS — R079 Chest pain, unspecified: Secondary | ICD-10-CM | POA: Diagnosis not present

## 2018-06-05 DIAGNOSIS — R1011 Right upper quadrant pain: Secondary | ICD-10-CM

## 2018-06-05 DIAGNOSIS — K219 Gastro-esophageal reflux disease without esophagitis: Secondary | ICD-10-CM

## 2018-06-05 DIAGNOSIS — I10 Essential (primary) hypertension: Secondary | ICD-10-CM | POA: Diagnosis not present

## 2018-06-05 NOTE — Assessment & Plan Note (Signed)
Borderline high, but stable for age range. Continue current regimen.

## 2018-06-05 NOTE — Patient Instructions (Addendum)
Increase your pantoprazole (heartburn medication) to 40 mg by taking two tablets daily.   Stop by the front desk and speak with either Rosaria Ferries or Anastasiya regarding your ultrasound.  Call Dr. Tanna Furry office to schedule another follow up visit.   Take a look at the trigger foods for heartburn listed below. Also avoid laying down within 2 hours after a meal.  It was a pleasure to see you today!   Food Choices for Gastroesophageal Reflux Disease, Adult When you have gastroesophageal reflux disease (GERD), the foods you eat and your eating habits are very important. Choosing the right foods can help ease your discomfort. What guidelines do I need to follow?  Choose fruits, vegetables, whole grains, and low-fat dairy products.  Choose low-fat meat, fish, and poultry.  Limit fats such as oils, salad dressings, butter, nuts, and avocado.  Keep a food diary. This helps you identify foods that cause symptoms.  Avoid foods that cause symptoms. These may be different for everyone.  Eat small meals often instead of 3 large meals a day.  Eat your meals slowly, in a place where you are relaxed.  Limit fried foods.  Cook foods using methods other than frying.  Avoid drinking alcohol.  Avoid drinking large amounts of liquids with your meals.  Avoid bending over or lying down until 2-3 hours after eating. What foods are not recommended? These are some foods and drinks that may make your symptoms worse: Vegetables Tomatoes. Tomato juice. Tomato and spaghetti sauce. Chili peppers. Onion and garlic. Horseradish. Fruits Oranges, grapefruit, and lemon (fruit and juice). Meats High-fat meats, fish, and poultry. This includes hot dogs, ribs, ham, sausage, salami, and bacon. Dairy Whole milk and chocolate milk. Sour cream. Cream. Butter. Ice cream. Cream cheese. Drinks Coffee and tea. Bubbly (carbonated) drinks or energy drinks. Condiments Hot sauce. Barbecue  sauce. Sweets/Desserts Chocolate and cocoa. Donuts. Peppermint and spearmint. Fats and Oils High-fat foods. This includes [place] fries and potato chips. Other Vinegar. Strong spices. This includes black pepper, white pepper, red pepper, cayenne, curry powder, cloves, ginger, and chili powder. The items listed above may not be a complete list of foods and drinks to avoid. Contact your dietitian for more information. This information is not intended to replace advice given to you by your health care provider. Make sure you discuss any questions you have with your health care provider. Document Released: 06/13/2012 Document Revised: 05/20/2016 Document Reviewed: 10/17/2013 Elsevier Interactive Patient Education  2017 [employer].

## 2018-06-05 NOTE — Assessment & Plan Note (Signed)
Continued, daily chest pain x 1 month. Temporary improvement with pantoprazole, taking Tums often.  Recent and prior cardiac work up negative, including recent ED visit.   Will increase pantoprazole to 40 mg daily as her symptoms do seem to be more GI rather than cardiac in nature. Will also obtain ultrasound of upper abdomen to rule out any other GI cause.  Do recommend she contact cardiology for potential stress test or coronary CTA for further evaluation.

## 2018-06-05 NOTE — Progress Notes (Signed)
Subjective:    Patient ID: Brenda Contreras, female    DOB: March 19, 1951, 67 y.o.   MRN: [id number]  HPI  Brenda Contreras is a 67 year old female with a history of GERD, type 2 diabetes, hypertension, hyperlipidemia who presents today for emergency department follow up.  She presented to [facility] on 05/26/18 with complaints of chest pain. She described her chest pain as an intermittent pulling sensation to the middle and left chest that began 3-4 weeks ago.   She's been evaluated in the emergency department and seen in our office for similar complaints in the past. Cardiac work up on prior visits was unremarkable. She's also endorsed symptoms that represent GERD so she was trialed on PPI and asked to follow up if symptoms persisted.  She was evaluated by cardiology in March 2019 and endorsed complete resolve of symptoms since starting pantoprazole. She underwent cardiac catheterization in 2008 which showed normal coronaries.   During her stay in the ED from her most recent visit she underwent testing including chest xray (negative), labs (BMP, troponin x 3, d-dimer, CBC all negative). Her symptoms were thought to be secondary to pleurisy so she was discharged home and asked to follow up with PCP.  Since her ED visit she continues to experience chest pain. Her chest pain has been daily for the last one month. Her symptoms continue to be located to the substernal chest with radiation to left upper chest.   Last night she felt her pain which she describes as "dull pain" that was located to the substernal region with radiation to her left chest. She took several Tums last night with eventual decrease in symptoms. She's compliant to her pantoprazole with continued chest pain.   She does experience bilateral upper abdominal discomfort and bloating after some meals. Her bloating will last for several hours with improvement after taking Tums. This has occurred 3-4 times over the last month. She denies nausea,  vomiting, constipation.   BP Readings from Last 3 Encounters:  06/05/18 140/70  05/26/18 134/87  05/15/18 (!) 142/74     Review of Systems  Constitutional: Negative for fever.  Respiratory: Negative for shortness of breath.   Cardiovascular: Positive for chest pain.  Gastrointestinal: Negative for blood in stool, constipation, diarrhea, nausea and vomiting.       Abdominal discomfort and bloating       Past Medical History:  Diagnosis Date  . Diabetes mellitus   . Dizziness   . GERD (gastroesophageal reflux disease)   . High cholesterol   . Hypertension   . Vaginal dryness      Social History   Socioeconomic History  . Marital status: Married    Spouse name: Not on file  . Number of children: Not on file  . Years of education: Not on file  . Highest education level: Not on file  Occupational History  . Not on file  Social Needs  . Financial resource strain: Not on file  . Food insecurity:    Worry: Not on file    Inability: Not on file  . Transportation needs:    Medical: Not on file    Non-medical: Not on file  Tobacco Use  . Smoking status: Never Smoker  . Smokeless tobacco: Never Used  Substance and Sexual Activity  . Alcohol use: No  . Drug use: No  . Sexual activity: Yes  Lifestyle  . Physical activity:    Days per week: Not on file  Minutes per session: Not on file  . Stress: Not on file  Relationships  . Social connections:    Talks on phone: Not on file    Gets together: Not on file    Attends religious service: Not on file    Active member of club or organization: Not on file    Attends meetings of clubs or organizations: Not on file    Relationship status: Not on file  . Intimate partner violence:    Fear of current or ex partner: Not on file    Emotionally abused: Not on file    Physically abused: Not on file    Forced sexual activity: Not on file  Other Topics Concern  . Not on file  Social History Narrative   Married.   Has her  own business.      Has two children and two grandchildren.      Does not have a living will.  Desires CPR, would not want prolonged life support if futile.    Past Surgical History:  Procedure Laterality Date  . APPENDECTOMY    . BREAST BIOPSY    . TUBAL LIGATION      Family History  Problem Relation Age of Onset  . Diabetes Father   . Hypertension Father   . Hyperlipidemia Father   . Stroke Father   . Diabetes Sister   . Hyperlipidemia Sister   . Hypertension Sister   . Lung cancer Paternal Uncle   . Diabetes Paternal Grandmother   . Heart disease Paternal Grandfather     No Known Allergies  Current Outpatient Medications on File Prior to Visit  Medication Sig Dispense Refill  . amLODipine (NORVASC) 5 MG tablet Take 1 tablet (5 mg total) by mouth daily. 90 tablet 3  . aspirin EC 81 MG tablet Take 81 mg by mouth daily.    . carvedilol (COREG) 6.25 MG tablet Take 1 tablet (6.25 mg total) by mouth at bedtime. 90 tablet 3  . dapagliflozin propanediol (FARXIGA) 5 MG TABS tablet Take 5 mg by mouth daily. 90 tablet 1  . olmesartan-hydrochlorothiazide (BENICAR HCT) 40-25 MG tablet Take 1 tablet by mouth daily. 90 tablet 3  . pantoprazole (PROTONIX) 20 MG tablet Take 1 tablet (20 mg total) by mouth daily. 90 tablet 1  . rosuvastatin (CRESTOR) 20 MG tablet Take 1 tablet (20 mg total) by mouth at bedtime. 90 tablet 3  . sitaGLIPtin-metformin (JANUMET) 50-1000 MG tablet Take 1 tablet by mouth 2 (two) times daily with a meal. 180 tablet 1   No current facility-administered medications on file prior to visit.     BP 140/70   Pulse 76   Temp 98.1 F (36.7 C) (Oral)   Ht 5' 2.5" (1.588 m)   Wt 154 lb 8 oz (70.1 kg)   SpO2 99%   BMI 27.81 kg/m    Objective:   Physical Exam  Constitutional: She appears well-nourished.  Neck: Neck supple.  Cardiovascular: Normal rate and regular rhythm.  Respiratory: Effort normal and breath sounds normal.  GI: Soft. Bowel sounds are  normal. There is no tenderness. There is negative Murphy's sign.  Skin: Skin is warm and dry.  Psychiatric: She has a normal mood and affect.           Assessment & Plan:

## 2018-06-05 NOTE — Assessment & Plan Note (Signed)
Taking Tums often due to recurrent symptoms. Increase pantoprazole to 40 mg daily, she will update in a week  [place] of abdomen pending given upper abdominal discomfort and bloating. This is still likely related to GERD, but will rule out any other cause given grossly normal cardiac work up.

## 2018-06-12 DIAGNOSIS — Z1211 Encounter for screening for malignant neoplasm of colon: Secondary | ICD-10-CM | POA: Diagnosis not present

## 2018-06-12 DIAGNOSIS — Z800 Family history of malignant neoplasm of digestive organs: Secondary | ICD-10-CM | POA: Diagnosis not present

## 2018-06-12 DIAGNOSIS — K5730 Diverticulosis of large intestine without perforation or abscess without bleeding: Secondary | ICD-10-CM | POA: Diagnosis not present

## 2018-06-12 LAB — HM COLONOSCOPY

## 2018-06-13 ENCOUNTER — Other Ambulatory Visit: Payer: Medicare Other

## 2018-06-13 ENCOUNTER — Encounter: Payer: Self-pay | Admitting: Primary Care

## 2018-06-15 ENCOUNTER — Ambulatory Visit
Admission: RE | Admit: 2018-06-15 | Discharge: 2018-06-15 | Disposition: A | Discharge status: Home / Self Care | Payer: Medicare Other | Source: Ambulatory Visit | Attending: Primary Care | Admitting: Primary Care

## 2018-06-15 DIAGNOSIS — R1012 Left upper quadrant pain: Principal | ICD-10-CM

## 2018-06-15 DIAGNOSIS — R1011 Right upper quadrant pain: Secondary | ICD-10-CM

## 2018-06-15 DIAGNOSIS — R140 Abdominal distension (gaseous): Secondary | ICD-10-CM | POA: Diagnosis not present

## 2018-07-06 ENCOUNTER — Encounter: Payer: Self-pay | Admitting: Primary Care

## 2018-08-18 DIAGNOSIS — K219 Gastro-esophageal reflux disease without esophagitis: Secondary | ICD-10-CM

## 2018-08-18 MED ORDER — PANTOPRAZOLE SODIUM 20 MG PO TBEC: 20.0000 mg | DELAYED_RELEASE_TABLET | Freq: Every day | ORAL | 0 refills | Status: DC

## 2018-08-25 DIAGNOSIS — E119 Type 2 diabetes mellitus without complications: Secondary | ICD-10-CM

## 2018-08-25 MED ORDER — SITAGLIPTIN PHOS-METFORMIN HCL 50-1000 MG PO TABS: 1.0000 | ORAL_TABLET | Freq: Two times a day (BID) | ORAL | 1 refills | Status: DC

## 2018-08-25 MED ORDER — DAPAGLIFLOZIN PROPANEDIOL 5 MG PO TABS: 5.0000 mg | ORAL_TABLET | Freq: Every day | ORAL | 1 refills | Status: DC

## 2018-10-12 ENCOUNTER — Ambulatory Visit: Payer: Medicare Other

## 2018-10-26 ENCOUNTER — Ambulatory Visit: Payer: Medicare Other

## 2018-11-15 ENCOUNTER — Ambulatory Visit: Payer: Medicare Other | Admitting: Primary Care

## 2018-11-28 ENCOUNTER — Other Ambulatory Visit: Payer: Self-pay

## 2018-11-28 DIAGNOSIS — K219 Gastro-esophageal reflux disease without esophagitis: Secondary | ICD-10-CM

## 2018-11-28 MED ORDER — PANTOPRAZOLE SODIUM 20 MG PO TBEC: 20.0000 mg | DELAYED_RELEASE_TABLET | Freq: Every day | ORAL | 1 refills | Status: DC

## 2019-01-11 ENCOUNTER — Telehealth: Payer: Self-pay | Admitting: *Deleted

## 2019-01-11 NOTE — Telephone Encounter (Signed)
Spoke to pharmacy who states pt is needing diabetic testing supplies and insurance covers oneTouch ultra.

## 2019-01-12 MED ORDER — ONETOUCH LANCETS MISC: 2 refills | Status: AC

## 2019-01-12 MED ORDER — GLUCOSE BLOOD VI STRP: ORAL_STRIP | 5 refills | Status: DC

## 2019-01-12 MED ORDER — ONETOUCH ULTRA 2 W/DEVICE KIT: PACK | 0 refills | Status: AC

## 2019-01-12 NOTE — Telephone Encounter (Signed)
Rx has been send to [facility]

## 2019-02-08 ENCOUNTER — Encounter: Payer: Self-pay | Admitting: Family Medicine

## 2019-02-08 ENCOUNTER — Ambulatory Visit (INDEPENDENT_AMBULATORY_CARE_PROVIDER_SITE_OTHER): Payer: Medicare Other | Admitting: Family Medicine

## 2019-02-08 VITALS — BP 160/94 | HR 74 | Temp 98.1°F | Ht 62.5 in | Wt 152.2 lb

## 2019-02-08 DIAGNOSIS — Z23 Encounter for immunization: Secondary | ICD-10-CM | POA: Diagnosis not present

## 2019-02-08 DIAGNOSIS — I10 Essential (primary) hypertension: Secondary | ICD-10-CM | POA: Diagnosis not present

## 2019-02-08 DIAGNOSIS — J0100 Acute maxillary sinusitis, unspecified: Secondary | ICD-10-CM | POA: Diagnosis not present

## 2019-02-08 DIAGNOSIS — J019 Acute sinusitis, unspecified: Secondary | ICD-10-CM | POA: Insufficient documentation

## 2019-02-08 MED ORDER — BENZONATATE 200 MG PO CAPS
200.0000 mg | ORAL_CAPSULE | Freq: Three times a day (TID) | ORAL | 1 refills | Status: DC | PRN
Start: 2019-02-08 — End: 2019-02-27

## 2019-02-08 MED ORDER — AMOXICILLIN-POT CLAVULANATE 875-125 MG PO TABS
1.0000 | ORAL_TABLET | Freq: Two times a day (BID) | ORAL | 0 refills | Status: DC
Start: 2019-02-08 — End: 2019-02-27

## 2019-02-08 NOTE — Assessment & Plan Note (Signed)
S/p uri in dec - wax and waning  Cover with augmentin  Tessalon for cough  mucinex DM otc  Disc symptomatic care - see instructions on AVS  Update if not starting to improve in a week or if worsening

## 2019-02-08 NOTE — Progress Notes (Signed)
Subjective:    Patient ID: Brenda Contreras, female    DOB: 24-Jan-1951, 68 y.o.   MRN: [id number]  HPI  68 yo pt of NP [facility] with cough and congestion   Started getting cold symptoms in late December  That lasted 2 weeks- then got better Several days later symptoms returned  Since then they wax and wane   Now she is worse than a week ago  Sneezing Coughing -productive (she does not look at phlegm to know if it has color)  No wheezing  No sob  Runny nose-clear  Eyes feel funny  Sinus pressure under eyes  Eyes water a lot  Nasal congestion  Does not have a fever/ no body aches or chills   Has not been able to get fu shot   [facility] - cough and cold combo medicine (? What is in it) it helps    BP is high today She does not check it at home  Sick and also in grief- husband passed in dec  Here meds by mail is out of benicar so she is not taking it  BP Readings from Last 3 Encounters:  02/08/19 (!) 160/94  06/05/18 140/70  05/26/18 134/87   Patient Active Problem List   Diagnosis Date Noted  . Acute sinusitis 02/08/2019  . Chest pain 02/21/2018  . Encounter for diabetic foot exam ([place]) 05/05/2017  . Medicare annual wellness visit, initial 04/25/2017  . HTN (hypertension) 01/13/2016  . GERD (gastroesophageal reflux disease) 01/13/2016  . Diabetes ([place]) 01/13/2016  . HLD (hyperlipidemia) 01/13/2016   Past Medical History:  Diagnosis Date  . Diabetes mellitus   . 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 Dizziness   . GERD (gastroesophageal reflux disease)   . High cholesterol   . Hypertension   . Vaginal dryness    Past Surgical History:  Procedure Laterality Date  . APPENDECTOMY    . BREAST BIOPSY    . TUBAL LIGATION     Social History   Tobacco Use  . Smoking status: Never Smoker  . Smokeless tobacco: Never Used  Substance Use Topics  . Alcohol use: No  . Drug use: No   Family History  Problem Relation Age of Onset  . Diabetes Father   .  Hypertension Father   . Hyperlipidemia Father   . Stroke Father   . Diabetes Sister   . Hyperlipidemia Sister   . Hypertension Sister   . Lung cancer Paternal Uncle   . Diabetes Paternal Grandmother   . Heart disease Paternal Grandfather    No Known Allergies Current Outpatient Medications on File Prior to Visit  Medication Sig Dispense Refill  . amLODipine (NORVASC) 5 MG tablet Take 1 tablet (5 mg total) by mouth daily. 90 tablet 3  . aspirin EC 81 MG tablet Take 81 mg by mouth daily.    . Blood Glucose Monitoring Suppl (ONE TOUCH ULTRA 2) w/Device KIT Use as instructed to test blood sugar daily. Dx is E11.9 1 each 0  . carvedilol (COREG) 6.25 MG tablet Take 1 tablet (6.25 mg total) by mouth at bedtime. 90 tablet 3  . dapagliflozin propanediol (FARXIGA) 5 MG TABS tablet Take 5 mg by mouth daily. 90 tablet 1  . glucose blood (ONE TOUCH ULTRA TEST) test strip OneTouch Use as instructed to test blood sugar daily. Dx is E11.9 100 each 5  . olmesartan-hydrochlorothiazide (BENICAR HCT) 40-25 MG tablet Take 1 tablet by mouth  daily. 90 tablet 3  . ONE TOUCH LANCETS MISC Use as instructed to test blood sugar daily. Dx is E11.9 200 each 2  . pantoprazole (PROTONIX) 20 MG tablet Take 1 tablet (20 mg total) by mouth daily. 90 tablet 1  . rosuvastatin (CRESTOR) 20 MG tablet Take 1 tablet (20 mg total) by mouth at bedtime. 90 tablet 3  . sitaGLIPtin-metformin (JANUMET) 50-1000 MG tablet Take 1 tablet by mouth 2 (two) times daily with a meal. 180 tablet 1   No current facility-administered medications on file prior to visit.      Review of Systems     Objective:   Physical Exam Constitutional:      General: She is not in acute distress.    Appearance: Normal appearance. She is well-developed. She is not ill-appearing, toxic-appearing or diaphoretic.  HENT:     Head: Normocephalic and atraumatic.     Comments: Nares are injected and congested   Mild maxillary sinus tenderness    Right Ear:  Tympanic membrane, ear canal and external ear normal.     Left Ear: Tympanic membrane, ear canal and external ear normal.     Nose: Congestion and rhinorrhea present.     Mouth/Throat:     Mouth: Mucous membranes are moist.     Pharynx: Oropharynx is clear. No oropharyngeal exudate or posterior oropharyngeal erythema.     Comments: Clear pnd  Eyes:     General:        Right eye: No discharge.        Left eye: No discharge.     Conjunctiva/sclera: Conjunctivae normal.     Pupils: Pupils are equal, round, and reactive to light.  Neck:     Musculoskeletal: Normal range of motion and neck supple.  Cardiovascular:     Rate and Rhythm: Normal rate.     Heart sounds: Normal heart sounds.  Pulmonary:     Effort: Pulmonary effort is normal. No respiratory distress.     Breath sounds: Normal breath sounds. No stridor. No wheezing, rhonchi or rales.     Comments: Good air exch No rales/rhonchi  No wheeze Harsh bs noted with upper airway noise Chest:     Chest wall: No tenderness.  Lymphadenopathy:     Cervical: No cervical adenopathy.  Skin:    General: Skin is warm and dry.     Capillary Refill: Capillary refill takes less than 2 seconds.     Findings: No rash.  Neurological:     Mental Status: She is alert.     Cranial Nerves: No cranial nerve deficit.  Psychiatric:        Mood and Affect: Mood normal.           Assessment & Plan:   Problem List Items Addressed This Visit      Cardiovascular and Mediastinum   HTN (hypertension)    bp is up-per pt unable to get benicar hct from her mail order pharmacy  She plans to call today-if still unavailable will contact PCP to sub something else BP: (!) 160/94            Respiratory   Acute sinusitis - Primary    S/p uri in dec - wax and waning  Cover with augmentin  Tessalon for cough  mucinex DM otc  Disc symptomatic care - see instructions on AVS  Update if not starting to improve in a week or if worsening         Relevant Medications  amoxicillin-clavulanate (AUGMENTIN) 875-125 MG tablet   benzonatate (TESSALON) 200 MG capsule

## 2019-02-08 NOTE — Addendum Note (Signed)
Addended by: Tammi Sou on: 02/08/2019 11:24 AM   Modules accepted: Orders

## 2019-02-08 NOTE — Patient Instructions (Signed)
Drink lots of fluids and rest   Take augmentin for sinus infection  Also tessalon for cough  Over the counter mucinex DM is good for cough and congestion    If you find out you are not going to get your BP medicine soon please notify [facility]   Take care of yourself Flu shot today  Update if not starting to improve in a week or if worsening

## 2019-02-08 NOTE — Assessment & Plan Note (Signed)
bp is up-per pt unable to get benicar hct from her mail order pharmacy  She plans to call today-if still unavailable will contact PCP to sub something else BP: (!) 160/94

## 2019-02-12 MED ORDER — LOSARTAN POTASSIUM-HCTZ 100-12.5 MG PO TABS
1.0000 | ORAL_TABLET | Freq: Every day | ORAL | 0 refills | Status: DC
Start: 2019-02-12 — End: 2019-03-16

## 2019-02-27 ENCOUNTER — Ambulatory Visit (INDEPENDENT_AMBULATORY_CARE_PROVIDER_SITE_OTHER): Payer: Medicare Other | Admitting: Primary Care

## 2019-02-27 ENCOUNTER — Encounter: Payer: Self-pay | Admitting: Primary Care

## 2019-02-27 VITALS — BP 130/86 | HR 80 | Temp 98.5°F | Ht 62.5 in | Wt 149.5 lb

## 2019-02-27 DIAGNOSIS — E119 Type 2 diabetes mellitus without complications: Secondary | ICD-10-CM | POA: Diagnosis not present

## 2019-02-27 DIAGNOSIS — I10 Essential (primary) hypertension: Secondary | ICD-10-CM | POA: Diagnosis not present

## 2019-02-27 LAB — POCT GLYCOSYLATED HEMOGLOBIN (HGB A1C): Hemoglobin A1C: 6.3 % — AB (ref 4.0–5.6)

## 2019-02-27 NOTE — Assessment & Plan Note (Signed)
A1C of 6.3 today. Continue current medication regimen. Discussed to work on diet, start exercising. Pneumonia vaccination UTD. Foot exam today. Managed on ARB and statin.  Follow up in 6 months for diabetes check.

## 2019-02-27 NOTE — Patient Instructions (Signed)
Continue your current medications as prescribed.  Start exercising. You should be getting 150 minutes of moderate intensity exercise weekly.  It is important that you improve your diet. Please limit carbohydrates in the form of white bread, rice, pasta, sweets, fast food, fried food, sugary drinks, etc. Increase your consumption of fresh fruits and vegetables, whole grains, lean protein.  Ensure you are consuming 64 ounces of water daily.  We will see you in May as scheduled. It was a pleasure to see you today!

## 2019-02-27 NOTE — Progress Notes (Signed)
Subjective:    Patient ID: Brenda Contreras, female    DOB: [birth year], 68 y.o.   MRN: [id number]  HPI  Brenda Contreras is a 68 year old female who presents today for follow up.  1) Essential Hypertension: Currently managed on losartan-HCTZ and carvedilol 6.25 mg BID, Amlodipine 10 mg.   She is not checking her BP at home. She denies dizziness, chest pain.   BP Readings from Last 3 Encounters:  02/27/19 130/86  02/08/19 (!) 160/94  06/05/18 140/70     2) Type 2 Diabetes:  Current medications include: Sitagliptin-Metformin 50-1000 mg BID, Farxiga 5 mg  She is checking her blood glucose 1-2 times daily and is getting readings of:  AM fasting: 110's-140's  Highest reading: 154 Lowest reading: 112  Last A1C: 6.4 in May 2019, 6.3 today Last Eye Exam: Completed in May 2019 Last Foot Exam: Completed in May 2019 Pneumonia Vaccination: Completed in 2018 ACE/ARB: Losartan  Statin: Crestor  Diet currently consists of:  Breakfast: Bagel with cream cheese Lunch: Fruit, skips sometimes Dinner: Fast food, hot dog, little protein, vegetables  Snacks: None Desserts: 3 times weekly  Beverages: Water  Exercise: She is not exercising, recently joined the [employer]   Review of Systems  Eyes: Negative for visual disturbance.  Respiratory: Negative for shortness of breath.   Cardiovascular: Negative for chest pain.  Neurological: Negative for numbness.        Past Medical History:  Diagnosis Date  . Diabetes mellitus   . 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 Dizziness   . GERD (gastroesophageal reflux disease)   . High cholesterol   . Hypertension   . Vaginal dryness      Social History   Socioeconomic History  . Marital status: Married    Spouse name: Not on file  . Number of children: Not on file  . Years of education: Not on file  . Highest education level: Not on file  Occupational History  . Not on file  Social Needs  . Financial resource strain: Not on  file  . Food insecurity:    Worry: Not on file    Inability: Not on file  . Transportation needs:    Medical: Not on file    Non-medical: Not on file  Tobacco Use  . Smoking status: Never Smoker  . Smokeless tobacco: Never Used  Substance and Sexual Activity  . Alcohol use: No  . Drug use: No  . Sexual activity: Yes  Lifestyle  . Physical activity:    Days per week: Not on file    Minutes per session: Not on file  . Stress: Not on file  Relationships  . Social connections:    Talks on phone: Not on file    Gets together: Not on file    Attends religious service: Not on file    Active member of club or organization: Not on file    Attends meetings of clubs or organizations: Not on file    Relationship status: Not on file  . Intimate partner violence:    Fear of current or ex partner: Not on file    Emotionally abused: Not on file    Physically abused: Not on file    Forced sexual activity: Not on file  Other Topics Concern  . 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      Has two children and two  grandchildren.      Does not have a living will.  Desires CPR, would not want prolonged life support if futile.    Past Surgical History:  Procedure Laterality Date  . APPENDECTOMY    . BREAST BIOPSY    . TUBAL LIGATION      Family History  Problem Relation Age of Onset  . Diabetes Father   . Hypertension Father   . Hyperlipidemia Father   . Stroke Father   . Diabetes Sister   . Hyperlipidemia Sister   . Hypertension Sister   . Lung cancer Paternal Uncle   . Diabetes Paternal Grandmother   . Heart disease Paternal Grandfather     No Known Allergies  Current Outpatient Medications on File Prior to Visit  Medication Sig Dispense Refill  . amLODipine (NORVASC) 5 MG tablet Take 1 tablet (5 mg total) by mouth daily. 90 tablet 3  . aspirin EC 81 MG tablet Take 81 mg by mouth daily.    . Blood Glucose Monitoring Suppl (ONE TOUCH  ULTRA 2) w/Device KIT Use as instructed to test blood sugar daily. Dx is E11.9 1 each 0  . carvedilol (COREG) 6.25 MG tablet Take 1 tablet (6.25 mg total) by mouth at bedtime. 90 tablet 3  . dapagliflozin propanediol (FARXIGA) 5 MG TABS tablet Take 5 mg by mouth daily. 90 tablet 1  . glucose blood (ONE TOUCH ULTRA TEST) test strip OneTouch Use as instructed to test blood sugar daily. Dx is E11.9 100 each 5  . losartan-hydrochlorothiazide (HYZAAR) 100-12.5 MG tablet Take 1 tablet by mouth daily. For blood pressure. 30 tablet 0  . ONE TOUCH LANCETS MISC Use as instructed to test blood sugar daily. Dx is E11.9 200 each 2  . pantoprazole (PROTONIX) 20 MG tablet Take 1 tablet (20 mg total) by mouth daily. 90 tablet 1  . rosuvastatin (CRESTOR) 20 MG tablet Take 1 tablet (20 mg total) by mouth at bedtime. 90 tablet 3  . sitaGLIPtin-metformin (JANUMET) 50-1000 MG tablet Take 1 tablet by mouth 2 (two) times daily with a meal. 180 tablet 1   No current facility-administered medications on file prior to visit.     BP 130/86   Pulse 80   Temp 98.5 F (36.9 C) (Oral)   Ht 5' 2.5" (1.588 m)   Wt 149 lb 8 oz (67.8 kg)   SpO2 98%   BMI 26.91 kg/m    Objective:   Physical Exam  Constitutional: She appears well-nourished.  Neck: Neck supple.  Cardiovascular: Normal rate and regular rhythm.  Respiratory: Effort normal and breath sounds normal.  Skin: Skin is warm and dry.           Assessment & Plan:

## 2019-02-27 NOTE — Assessment & Plan Note (Signed)
Stable when switching from Benicar to Losartan-HCTZ 100-12.5 mg, continue same. Continue to monitor.

## 2019-03-16 ENCOUNTER — Other Ambulatory Visit: Payer: Self-pay

## 2019-03-16 DIAGNOSIS — I10 Essential (primary) hypertension: Secondary | ICD-10-CM

## 2019-03-16 DIAGNOSIS — E785 Hyperlipidemia, unspecified: Secondary | ICD-10-CM

## 2019-03-16 DIAGNOSIS — E119 Type 2 diabetes mellitus without complications: Secondary | ICD-10-CM

## 2019-03-16 MED ORDER — CARVEDILOL 6.25 MG PO TABS: 6.2500 mg | ORAL_TABLET | Freq: Every day | ORAL | 3 refills | Status: DC

## 2019-03-16 MED ORDER — LOSARTAN POTASSIUM-HCTZ 100-12.5 MG PO TABS: 1.0000 | ORAL_TABLET | Freq: Every day | ORAL | 3 refills | Status: DC

## 2019-03-16 MED ORDER — AMLODIPINE BESYLATE 5 MG PO TABS: 5.0000 mg | ORAL_TABLET | Freq: Every day | ORAL | 3 refills | Status: DC

## 2019-03-16 MED ORDER — SITAGLIPTIN PHOS-METFORMIN HCL 50-1000 MG PO TABS: 1.0000 | ORAL_TABLET | Freq: Two times a day (BID) | ORAL | 3 refills | Status: DC

## 2019-03-16 MED ORDER — ROSUVASTATIN CALCIUM 20 MG PO TABS: 20.0000 mg | ORAL_TABLET | Freq: Every day | ORAL | 3 refills | Status: DC

## 2019-03-21 ENCOUNTER — Other Ambulatory Visit: Payer: Self-pay | Admitting: Primary Care

## 2019-03-21 DIAGNOSIS — I10 Essential (primary) hypertension: Secondary | ICD-10-CM

## 2019-05-07 ENCOUNTER — Other Ambulatory Visit: Payer: Self-pay | Admitting: Primary Care

## 2019-05-07 DIAGNOSIS — E785 Hyperlipidemia, unspecified: Secondary | ICD-10-CM

## 2019-05-07 DIAGNOSIS — I10 Essential (primary) hypertension: Secondary | ICD-10-CM

## 2019-05-09 ENCOUNTER — Telehealth: Payer: Self-pay

## 2019-05-09 ENCOUNTER — Ambulatory Visit (INDEPENDENT_AMBULATORY_CARE_PROVIDER_SITE_OTHER): Payer: Medicare Other

## 2019-05-09 DIAGNOSIS — Z0000 Encounter for general adult medical examination without abnormal findings: Secondary | ICD-10-CM

## 2019-05-09 DIAGNOSIS — E119 Type 2 diabetes mellitus without complications: Secondary | ICD-10-CM

## 2019-05-09 NOTE — Patient Instructions (Signed)
Ms. Curvin , Thank you for taking time to come for your Medicare Wellness Visit. I appreciate your ongoing commitment to your health goals. Please review the following plan we discussed and let me know if I can assist you in the future.   These are the goals we discussed: Goals    . Patient Stated     Starting 05/09/2019, I will continue to take medications as prescribed.        This is a list of the screening recommended for you and due dates:  Health Maintenance  Topic Date Due  . Eye exam for diabetics  05/25/2019  . Flu Shot  07/28/2019  . Hemoglobin A1C  08/30/2019  . Complete foot exam   02/27/2020  . Mammogram  04/27/2020  . Tetanus Vaccine  04/26/2026  . Colon Cancer Screening  06/12/2028  . DEXA scan (bone density measurement)  Completed  .  Hepatitis C: One time screening is recommended by Center for Disease Control  (CDC) for  adults born from [birth year] through 1966.   Completed  . Pneumonia vaccines  Completed   Preventive Care for Adults  A healthy lifestyle and preventive care can promote health and wellness. Preventive health guidelines for adults include the following key practices.  . A routine yearly physical is a good way to check with your health care provider about your health and preventive screening. It is a chance to share any concerns and updates on your health and to receive a thorough exam.  . Visit your dentist for a routine exam and preventive care every 6 months. Brush your teeth twice a day and floss once a day. Good oral hygiene prevents tooth decay and gum disease.  . The frequency of eye exams is based on your age, health, family medical history, use  of contact lenses, and other factors. Follow your health care provider's recommendations for frequency of eye exams.  . Eat a healthy diet. Foods like vegetables, fruits, whole grains, low-fat dairy products, and lean protein foods contain the nutrients you need without too many calories. Decrease your  intake of foods high in solid fats, added sugars, and salt. Eat the right amount of calories for you. Get information about a proper diet from your health care provider, if necessary.  . Regular physical exercise is one of the most important things you can do for your health. Most adults should get at least 150 minutes of moderate-intensity exercise (any activity that increases your heart rate and causes you to sweat) each week. In addition, most adults need muscle-strengthening exercises on 2 or more days a week.  Silver Sneakers may be a benefit available to you. To determine eligibility, you may visit the website: www.silversneakers.com or contact program at (508)020-0849 Mon-Fri between 8AM-8PM.   . Maintain a healthy weight. The body mass index (BMI) is a screening tool to identify possible weight problems. It provides an estimate of body fat based on height and weight. Your health care provider can find your BMI and can help you achieve or maintain a healthy weight.   For adults 20 years and older: ? A BMI below 18.5 is considered underweight. ? A BMI of 18.5 to 24.9 is normal. ? A BMI of 25 to 29.9 is considered overweight. ? A BMI of 30 and above is considered obese.   . Maintain normal blood lipids and cholesterol levels by exercising and minimizing your intake of saturated fat. Eat a balanced diet with plenty of fruit and vegetables.  Blood tests for lipids and cholesterol should begin at age 33 and be repeated every 5 years. If your lipid or cholesterol levels are high, you are over 50, or you are at high risk for heart disease, you may need your cholesterol levels checked more frequently. Ongoing high lipid and cholesterol levels should be treated with medicines if diet and exercise are not working.  . If you smoke, find out from your health care provider how to quit. If you do not use tobacco, please do not start.  . If you choose to drink alcohol, please do not consume more than 2  drinks per day. One drink is considered to be 12 ounces (355 mL) of beer, 5 ounces (148 mL) of wine, or 1.5 ounces (44 mL) of liquor.  . If you are 67-50 years old, ask your health care provider if you should take aspirin to prevent strokes.  . Use sunscreen. Apply sunscreen liberally and repeatedly throughout the day. You should seek shade when your shadow is shorter than you. Protect yourself by wearing long sleeves, pants, a wide-brimmed hat, and sunglasses year round, whenever you are outdoors.  . Once a month, do a whole body skin exam, using a mirror to look at the skin on your back. Tell your health care provider of new moles, moles that have irregular borders, moles that are larger than a pencil eraser, or moles that have changed in shape or color.

## 2019-05-09 NOTE — Telephone Encounter (Signed)
During AWV, patient requested a refill for the following medication:  Dapagliflozin propanediol (FARXIGA) 5 mg tablet  Pharmacy: [facility]

## 2019-05-09 NOTE — Progress Notes (Signed)
Subjective:   Brenda Contreras is a 68 y.o. female who presents for Medicare Annual (Subsequent) preventive examination.  Review of Systems:  N/A Cardiac Risk Factors include: advanced age (>49mn, >>65women);diabetes mellitus;dyslipidemia;hypertension     Objective:     Vitals: There were no vitals taken for this visit.  There is no height or weight on file to calculate BMI.  Advanced Directives 05/09/2019 05/04/2018 01/28/2018  Does Patient Have a Medical Advance Directive? Yes No No  Type of AParamedicof AMocaLiving will - -  Copy of HMurfreesboroin Chart? No - copy requested - -  Would patient like information on creating a medical advance directive? - Yes (MAU/Ambulatory/Procedural Areas - Information given) Yes (ED - Information included in AVS)    Tobacco Social History   Tobacco Use  Smoking Status Never Smoker  Smokeless Tobacco Never Used     Counseling given: No   Clinical Intake:  Pre-visit preparation completed: Yes  Pain : No/denies pain Pain Score: 0-No pain     Nutritional Status: BMI 25 -29 Overweight Nutritional Risks: None  How often do you need to have someone help you when you read instructions, pamphlets, or other written materials from your doctor or pharmacy?: 1 - Never What is the last grade level you completed in school?: 12th grade  Interpreter Needed?: No  Comments: pt is a widow and lives alone Information entered by :: LPinson, LPN  Past Medical History:  Diagnosis Date  . Diabetes mellitus   . 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 Dizziness   . GERD (gastroesophageal reflux disease)   . High cholesterol   . Hypertension   . Vaginal dryness    Past Surgical History:  Procedure Laterality Date  . APPENDECTOMY    . BREAST BIOPSY    . TUBAL LIGATION     Family History  Problem Relation Age of Onset  . Diabetes Father   . Hypertension Father   . Hyperlipidemia Father    . Stroke Father   . Diabetes Sister   . Hyperlipidemia Sister   . Hypertension Sister   . Lung cancer Paternal Uncle   . Diabetes Paternal Grandmother   . Heart disease Paternal Grandfather    Social History   Socioeconomic History  . Marital status: Widowed    Spouse name: Not on file  . Number of children: Not on file  . Years of education: Not on file  . Highest education level: Not on file  Occupational History  . Not on file  Social Needs  . Financial resource strain: Not on file  . Food insecurity:    Worry: Not on file    Inability: Not on file  . Transportation needs:    Medical: Not on file    Non-medical: Not on file  Tobacco Use  . Smoking status: Never Smoker  . Smokeless tobacco: Never Used  Substance and Sexual Activity  . Alcohol use: No  . Drug use: No  . Sexual activity: Not Currently  Lifestyle  . Physical activity:    Days per week: Not on file    Minutes per session: Not on file  . Stress: Not on file  Relationships  . Social connections:    Talks on phone: Not on file    Gets together: Not on file    Attends religious service: Not on file    Active member of club or organization:  Not on file    Attends meetings of clubs or organizations: Not on file    Relationship status: Not on file  Other Topics Concern  . 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      Has two children and two grandchildren.      Does not have a living will.  Desires CPR, would not want prolonged life support if futile.    Outpatient Encounter Medications as of 05/09/2019  Medication Sig  . amLODipine (NORVASC) 5 MG tablet Take 1 tablet (5 mg total) by mouth daily. For blood pressure.  Marland Kitchen aspirin EC 81 MG tablet Take 81 mg by mouth daily.  . Blood Glucose Monitoring Suppl (ONE TOUCH ULTRA 2) w/Device KIT Use as instructed to test blood sugar daily. Dx is E11.9  . carvedilol (COREG) 6.25 MG tablet Take 1 tablet (6.25 mg total) by mouth  at bedtime. For blood pressure.  . dapagliflozin propanediol (FARXIGA) 5 MG TABS tablet Take 5 mg by mouth daily.  Marland Kitchen glucose blood (ONE TOUCH ULTRA TEST) test strip OneTouch Use as instructed to test blood sugar daily. Dx is E11.9  . losartan-hydrochlorothiazide (HYZAAR) 100-12.5 MG tablet Take 1 tablet by mouth daily. For blood pressure.  . ONE TOUCH LANCETS MISC Use as instructed to test blood sugar daily. Dx is E11.9  . pantoprazole (PROTONIX) 20 MG tablet Take 1 tablet (20 mg total) by mouth daily.  . rosuvastatin (CRESTOR) 20 MG tablet Take 1 tablet (20 mg total) by mouth at bedtime.  . sitaGLIPtin-metformin (JANUMET) 50-1000 MG tablet Take 1 tablet by mouth 2 (two) times daily with a meal. For diabetes.  . [DISCONTINUED] hydrochlorothiazide (MICROZIDE) 12.5 MG capsule TAKE 1 CAPSULE BY MOUTH ONCE DAILY FOR BLOOD PRESSURE. *TAKE WITH LOSARTAN 100 MG*  . [DISCONTINUED] losartan (COZAAR) 100 MG tablet TAKE 1 TABLET BY MOUTH ONCE DAILY FOR BLOOD PRESSURE *TAKE WITH HCTZ 12.5 MG*   No facility-administered encounter medications on file as of 05/09/2019.     Activities of Daily Living In your present state of health, do you have any difficulty performing the following activities: 05/09/2019  Hearing? N  Vision? N  Difficulty concentrating or making decisions? N  Walking or climbing stairs? N  Dressing or bathing? N  Doing errands, shopping? N  Preparing Food and eating ? N  Using the Toilet? N  In the past six months, have you accidently leaked urine? N  Do you have problems with loss of bowel control? N  Managing your Medications? N  Managing your Finances? N  Housekeeping or managing your Housekeeping? N  Some recent data might be hidden    Patient Care Team: Pleas Koch, NP as PCP - General (Internal Medicine) Adrian Prows, MD as Consulting Physician (Cardiology) Marylynn Pearson, MD as Consulting Physician (Obstetrics and Gynecology) Warden Fillers, MD as Consulting  Physician (Ophthalmology)    Assessment:   This is a routine wellness examination for Brenda Contreras.  Vision Screening Comments: Vision exam in June 2019 with Dr. Katy Fitch  Exercise Activities and Dietary recommendations Current Exercise Habits: Home exercise routine, Type of exercise: walking, Time (Minutes): 60, Frequency (Times/Week): 2, Weekly Exercise (Minutes/Week): 120, Exercise limited by: None identified  Goals    . Patient Stated     Starting 05/09/2019, I will continue to take medications as prescribed.        Fall Risk Fall Risk  05/09/2019 05/04/2018 04/25/2017 04/20/2016  Falls in the past year?  0 No No No   Depression Screen PHQ 2/9 Scores 05/09/2019 05/04/2018 04/25/2017 04/20/2016  PHQ - 2 Score 3 0 0 0  PHQ- 9 Score 11 0 - -     Cognitive Function MMSE - Mini Mental State Exam 05/09/2019 05/04/2018  Orientation to time 5 5  Orientation to Place 5 5  Registration 3 3  Attention/ Calculation 0 0  Recall 3 3  Language- name 2 objects 0 0  Language- repeat 1 1  Language- follow 3 step command 0 3  Language- read & follow direction 0 0  Write a sentence 0 0  Copy design 0 0  Total score 17 20     PLEASE NOTE: A Mini-Cog screen was completed. Maximum score is 17. A value of 0 denotes this part of Folstein MMSE was not completed or the patient failed this part of the Mini-Cog screening.   Mini-Cog Screening Orientation to Time - Max 5 pts Orientation to Place - Max 5 pts Registration - Max 3 pts Recall - Max 3 pts Language Repeat - Max 1 pts      Immunization History  Administered Date(s) Administered  . Influenza,inj,Quad PF,6+ Mos 09/30/2016, 09/15/2017, 02/08/2019  . Pneumococcal Conjugate-13 04/20/2016  . Pneumococcal Polysaccharide-23 04/25/2017  . Tdap 04/26/2016  . Zoster 07/12/2016   Screening Tests Health Maintenance  Topic Date Due  . OPHTHALMOLOGY EXAM  05/25/2019  . INFLUENZA VACCINE  07/28/2019  . HEMOGLOBIN A1C  08/30/2019  . FOOT EXAM  02/27/2020   . MAMMOGRAM  04/27/2020  . TETANUS/TDAP  04/26/2026  . COLONOSCOPY  06/12/2028  . DEXA SCAN  Completed  . Hepatitis C Screening  Completed  . PNA vac Low Risk Adult  Completed         Plan:     I have personally reviewed, addressed, and noted the following in the patient's chart:  A. Medical and social history B. Use of alcohol, tobacco or illicit drugs  C. Current medications and supplements D. Functional ability and status E.  Nutritional status F.  Physical activity G. Advance directives H. List of other physicians I.  Hospitalizations, surgeries, and ER visits in previous 12 months J.  Vitals (unless it is a telemedicine encounter) K. Screenings to include cognitive, depression, hearing, vision (NOTE: hearing and vision screenings not completed in telemedicine encounter) L. Referrals and appointments   In addition, I have reviewed and discussed with patient certain preventive protocols, quality metrics, and best practice recommendations. A written personalized care plan for preventive services and recommendations were provided to patient.  With patient's permission, we connected on 05/09/19 at 10:30 AM EDT by a video enabled telemedicine application. Two patient identifiers were used to ensure the encounter occurred with the correct person.    Patient was in home and writer was in office.   Signed,   Lindell Noe, MHA, BS, LPN Health Coach

## 2019-05-09 NOTE — Progress Notes (Signed)
PCP notes:   Health maintenance:  No gaps identified  Abnormal screenings:   Depression score: 11 Depression screen [facility] 2/9 05/09/2019 05/04/2018 04/25/2017 04/20/2016  Decreased Interest 1 0 0 0  Down, Depressed, Hopeless 2 0 0 0  PHQ - 2 Score 3 0 0 0  Altered sleeping 2 0 - -  Tired, decreased energy 2 0 - -  Change in appetite 2 0 - -  Feeling bad or failure about yourself  0 0 - -  Trouble concentrating 2 0 - -  Moving slowly or fidgety/restless 0 0 - -  Suicidal thoughts 0 0 - -  PHQ-9 Score 11 0 - -  Difficult doing work/chores Not difficult at all - - -   Patient concerns:   None  Nurse concerns:  None  Next PCP appt:   05/11/19 @ 0940

## 2019-05-10 ENCOUNTER — Other Ambulatory Visit (INDEPENDENT_AMBULATORY_CARE_PROVIDER_SITE_OTHER): Payer: Medicare Other

## 2019-05-10 ENCOUNTER — Other Ambulatory Visit: Payer: Self-pay

## 2019-05-10 DIAGNOSIS — I10 Essential (primary) hypertension: Secondary | ICD-10-CM

## 2019-05-10 DIAGNOSIS — E785 Hyperlipidemia, unspecified: Secondary | ICD-10-CM

## 2019-05-10 LAB — COMPREHENSIVE METABOLIC PANEL
ALT: 18 U/L (ref 0–35)
AST: 16 U/L (ref 0–37)
Albumin: 4.2 g/dL (ref 3.5–5.2)
Alkaline Phosphatase: 67 U/L (ref 39–117)
BUN: 17 mg/dL (ref 6–23)
CO2: 26 mEq/L (ref 19–32)
Calcium: 9.4 mg/dL (ref 8.4–10.5)
Chloride: 102 mEq/L (ref 96–112)
Creatinine, Ser: 1.05 mg/dL (ref 0.40–1.20)
GFR: 51.97 mL/min — ABNORMAL LOW (ref >60.00)
Glucose, Bld: 146 mg/dL — ABNORMAL HIGH (ref 70–99)
Potassium: 4.1 mEq/L (ref 3.5–5.1)
Sodium: 139 mEq/L (ref 135–145)
Total Bilirubin: 0.9 mg/dL (ref 0.2–1.2)
Total Protein: 6.8 g/dL (ref 6.0–8.3)

## 2019-05-10 LAB — LIPID PANEL
Cholesterol: 181 mg/dL (ref 0–200)
HDL: 48.3 mg/dL (ref >39.00)
NonHDL: 132.45
Total CHOL/HDL Ratio: 4
Triglycerides: 220 mg/dL — ABNORMAL HIGH (ref 0.0–149.0)
VLDL: 44 mg/dL — ABNORMAL HIGH (ref 0.0–40.0)

## 2019-05-10 LAB — LDL CHOLESTEROL, DIRECT: Direct LDL: 94 mg/dL

## 2019-05-10 MED ORDER — DAPAGLIFLOZIN PROPANEDIOL 5 MG PO TABS: 5.0000 mg | ORAL_TABLET | Freq: Every day | ORAL | 3 refills | Status: DC

## 2019-05-10 NOTE — Progress Notes (Signed)
I reviewed health advisor's note, was available for consultation, and agree with documentation and plan.  

## 2019-05-10 NOTE — Telephone Encounter (Signed)
Noted, refill sent to pharmacy. 

## 2019-05-11 ENCOUNTER — Ambulatory Visit (INDEPENDENT_AMBULATORY_CARE_PROVIDER_SITE_OTHER): Payer: Medicare Other | Admitting: Primary Care

## 2019-05-11 DIAGNOSIS — I10 Essential (primary) hypertension: Secondary | ICD-10-CM

## 2019-05-11 DIAGNOSIS — E785 Hyperlipidemia, unspecified: Secondary | ICD-10-CM

## 2019-05-11 DIAGNOSIS — K219 Gastro-esophageal reflux disease without esophagitis: Secondary | ICD-10-CM

## 2019-05-11 DIAGNOSIS — E119 Type 2 diabetes mellitus without complications: Secondary | ICD-10-CM | POA: Diagnosis not present

## 2019-05-11 MED ORDER — LOSARTAN POTASSIUM 100 MG PO TABS: 100.0000 mg | ORAL_TABLET | Freq: Every day | ORAL | 0 refills | Status: DC

## 2019-05-11 NOTE — Progress Notes (Signed)
Subjective:    Patient ID: Brenda Contreras, female    DOB: 11-18-1951, 68 y.o.   MRN: [id number]  HPI  Virtual Visit via Video Note  I connected with Brenda Contreras on 05/11/19 at  9:40 AM EDT by a video enabled telemedicine application and verified that I am speaking with the correct person using two identifiers.  Location: Patient: Home Provider: Office   I discussed the limitations of evaluation and management by telemedicine and the availability of in person appointments. The patient expressed understanding and agreed to proceed.  History of Present Illness:  Brenda Contreras is a 68 year old female who presents today for [place] Part 2 and follow up of chronic health conditions.   Immunizations: -Tetanus: Completed In 2017 -Influenza: Completed this season -Pneumonia: Completed in 2018, 2017 -Shingles: Completed Zostavax in 2017  Colonoscopy: Completed in 2019, 2026 Mammogram: Completed in 2019, due for July 2020 Dexa: Due in July 2020 Hep C Screen: Negative  She is checking her blood glucose 1 times daily and is getting readings of:  AM fasting: 140-160's  Last A1C: 6.3 in March 2020 Last Eye Exam: Due in June 2020 Last Foot Exam: Completed in March 2020 Pneumonia Vaccination: Up to date ACE/ARB: Losartan  Statin: Crestor  BP Readings from Last 3 Encounters:  02/27/19 130/86  02/08/19 (!) 160/94  06/05/18 140/70      Observations/Objective:  Alert and oriented. Appears well, not sickly. No distress. Speaking in complete sentences.   Assessment and Plan:  See problem based charting.  Follow Up Instructions:  Stop taking losartan-hydrochlorothiazide 100-12.5 mg daily for blood pressure.  Start losartan 100 mg daily for blood pressure. I sent this to [facility].  Continue your diabetes and other blood pressure medications.  Call the office to schedule a follow up visit for two weeks for blood pressure and kidney function check.  It was a  pleasure to see you today! Allie Bossier, NP-C    I discussed the assessment and treatment plan with the patient. The patient was provided an opportunity to ask questions and all were answered. The patient agreed with the plan and demonstrated an understanding of the instructions.   The patient was advised to call back or seek an in-person evaluation if the symptoms worsen or if the condition fails to improve as anticipated.     Pleas Koch, NP    Review of Systems  Eyes: Negative for visual disturbance.  Respiratory: Negative for shortness of breath.   Cardiovascular: Negative for chest pain.  Neurological: Negative for dizziness.  Psychiatric/Behavioral: The patient is not nervous/anxious.        Past Medical History:  Diagnosis Date  . Diabetes mellitus   . 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 Dizziness   . GERD (gastroesophageal reflux disease)   . High cholesterol   . Hypertension   . Vaginal dryness      Social History   Socioeconomic History  . Marital status: Widowed    Spouse name: Not on file  . Number of children: Not on file  . Years of education: Not on file  . Highest education level: Not on file  Occupational History  . Not on file  Social Needs  . Financial resource strain: Not on file  . Food insecurity:    Worry: Not on file    Inability: Not on file  . Transportation needs:    Medical: Not on file  Non-medical: Not on file  Tobacco Use  . Smoking status: Never Smoker  . Smokeless tobacco: Never Used  Substance and Sexual Activity  . Alcohol use: No  . Drug use: No  . Sexual activity: Not Currently  Lifestyle  . Physical activity:    Days per week: Not on file    Minutes per session: Not on file  . Stress: Not on file  Relationships  . Social connections:    Talks on phone: Not on file    Gets together: Not on file    Attends religious service: Not on file    Active member of club or organization: Not on file     Attends meetings of clubs or organizations: Not on file    Relationship status: Not on file  . Intimate partner violence:    Fear of current or ex partner: Not on file    Emotionally abused: Not on file    Physically abused: Not on file    Forced sexual activity: Not on file  Other Topics Concern  . 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      Has two children and two grandchildren.      Does not have a living will.  Desires CPR, would not want prolonged life support if futile.    Past Surgical History:  Procedure Laterality Date  . APPENDECTOMY    . BREAST BIOPSY    . TUBAL LIGATION      Family History  Problem Relation Age of Onset  . Diabetes Father   . Hypertension Father   . Hyperlipidemia Father   . Stroke Father   . Diabetes Sister   . Hyperlipidemia Sister   . Hypertension Sister   . Lung cancer Paternal Uncle   . Diabetes Paternal Grandmother   . Heart disease Paternal Grandfather     No Known Allergies  Current Outpatient Medications on File Prior to Visit  Medication Sig Dispense Refill  . amLODipine (NORVASC) 5 MG tablet Take 1 tablet (5 mg total) by mouth daily. For blood pressure. 90 tablet 3  . aspirin EC 81 MG tablet Take 81 mg by mouth daily.    . Blood Glucose Monitoring Suppl (ONE TOUCH ULTRA 2) w/Device KIT Use as instructed to test blood sugar daily. Dx is E11.9 1 each 0  . carvedilol (COREG) 6.25 MG tablet Take 1 tablet (6.25 mg total) by mouth at bedtime. For blood pressure. 90 tablet 3  . dapagliflozin propanediol (FARXIGA) 5 MG TABS tablet Take 5 mg by mouth daily. For diabetes. 90 tablet 3  . glucose blood (ONE TOUCH ULTRA TEST) test strip OneTouch Use as instructed to test blood sugar daily. Dx is E11.9 100 each 5  . ONE TOUCH LANCETS MISC Use as instructed to test blood sugar daily. Dx is E11.9 200 each 2  . pantoprazole (PROTONIX) 20 MG tablet Take 1 tablet (20 mg total) by mouth daily. 90 tablet 1  .  rosuvastatin (CRESTOR) 20 MG tablet Take 1 tablet (20 mg total) by mouth at bedtime. 90 tablet 3  . sitaGLIPtin-metformin (JANUMET) 50-1000 MG tablet Take 1 tablet by mouth 2 (two) times daily with a meal. For diabetes. 180 tablet 3   No current facility-administered medications on file prior to visit.     There were no vitals taken for this visit.   Objective:   Physical Exam  Constitutional: She is oriented to person, place, and time.  She appears well-nourished.  Respiratory: Effort normal.  Neurological: She is alert and oriented to person, place, and time.  Psychiatric: She has a normal mood and affect.           Assessment & Plan:

## 2019-05-11 NOTE — Assessment & Plan Note (Signed)
Doing well on pantoprazole 20 mg daily. No breakthrough reflux. Continue same.

## 2019-05-11 NOTE — Assessment & Plan Note (Signed)
Recent LDL stable on current dose of rosuvastatin. Continue same.

## 2019-05-11 NOTE — Patient Instructions (Signed)
Stop taking losartan-hydrochlorothiazide 100-12.5 mg daily for blood pressure.  Start losartan 100 mg daily for blood pressure. I sent this to [facility].  Continue your diabetes and other blood pressure medications.  Call the office to schedule a follow up visit for two weeks for blood pressure and kidney function check.  It was a pleasure to see you today! Allie Bossier, NP-C

## 2019-05-11 NOTE — Assessment & Plan Note (Signed)
A1C from March 2020 stable at 6.3.  Managed on ARB and statin. Pneumonia vaccination UTD. Eye exam due this Summer. Foot exam UTD.  Continue current regimen. Follow up in 6 months.

## 2019-05-11 NOTE — Assessment & Plan Note (Signed)
Stable during last visit, however, recent BMP with further reduction in renal function.  Stop HCTZ. Rx for plain losartan 100 mg sent to pharmacy. Continue other medications as prescribed.  We will plan to see her in the office in 2 weeks for BP check and BMP.

## 2019-05-17 ENCOUNTER — Encounter: Payer: Medicare Other | Admitting: Primary Care

## 2019-05-25 ENCOUNTER — Other Ambulatory Visit: Payer: Self-pay

## 2019-05-25 ENCOUNTER — Other Ambulatory Visit: Payer: Self-pay | Admitting: Primary Care

## 2019-05-25 ENCOUNTER — Ambulatory Visit (INDEPENDENT_AMBULATORY_CARE_PROVIDER_SITE_OTHER): Payer: Medicare Other | Admitting: Primary Care

## 2019-05-25 ENCOUNTER — Encounter: Payer: Self-pay | Admitting: Primary Care

## 2019-05-25 DIAGNOSIS — I10 Essential (primary) hypertension: Secondary | ICD-10-CM | POA: Diagnosis not present

## 2019-05-25 DIAGNOSIS — N289 Disorder of kidney and ureter, unspecified: Secondary | ICD-10-CM

## 2019-05-25 LAB — BASIC METABOLIC PANEL
BUN: 20 mg/dL (ref 6–23)
CO2: 24 mEq/L (ref 19–32)
Calcium: 10 mg/dL (ref 8.4–10.5)
Chloride: 104 mEq/L (ref 96–112)
Creatinine, Ser: 1.08 mg/dL (ref 0.40–1.20)
GFR: 50.3 mL/min — ABNORMAL LOW (ref >60.00)
Glucose, Bld: 134 mg/dL — ABNORMAL HIGH (ref 70–99)
Potassium: 4.5 mEq/L (ref 3.5–5.1)
Sodium: 138 mEq/L (ref 135–145)

## 2019-05-25 NOTE — Assessment & Plan Note (Signed)
Slightly above goal but overall stable for age.  Repeat BMP today and if we see a return to baseline we will continue current regimen.  We will have her start checking BP at home and report consistent readings at or above 140/90. Continue Amlodipine and Carvedilol.

## 2019-05-25 NOTE — Patient Instructions (Signed)
Stop by the lab prior to leaving today. I will notify you of your results once received.   Continue losartan 100 mg, amlodipine 5 mg, and carvedilol 6.25 mg twice daily.  Start monitoring your blood pressure daily, around the same time of day, for the next 2-3 weeks.  Ensure that you have rested for 30 minutes prior to checking your blood pressure. Record your readings and notify me if you see readings consistently at or above 140/90.  Please schedule a follow up appointment in 6 months for diabetes check.   It was a pleasure to see you today!

## 2019-05-25 NOTE — Progress Notes (Signed)
Subjective:    Patient ID: Brenda Contreras, female    DOB: 08-Sep-1951, 67 y.o.   MRN: [id number]  HPI  Brenda Contreras is a 68 year old female who presents today for follow up of hypertension.  She was last evaluated on 05/11/19 for follow up of hypertension, managed on HCTZ but with a decrease in renal function from recent BMP. Given this information we stopped her HCTZ and switched her to losartan 100 mg. We continued her Amlodipine 5 mg and carvedilol 6.25 mg BID. She was instructed to follow up several weeks later  Since her last visit she is not checking her BP at home. She is compliant to her carvedilol and amlodipine. She denies dizziness, chest pain.   BP Readings from Last 3 Encounters:  05/25/19 140/80  02/27/19 130/86  02/08/19 (!) 160/94     Review of Systems  Respiratory: Negative for shortness of breath.   Cardiovascular: Negative for chest pain.  Neurological: Negative for dizziness and headaches.       Past Medical History:  Diagnosis Date  . Diabetes mellitus   . 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 Dizziness   . GERD (gastroesophageal reflux disease)   . High cholesterol   . Hypertension   . Vaginal dryness      Social History   Socioeconomic History  . Marital status: Widowed    Spouse name: Not on file  . Number of children: Not on file  . Years of education: Not on file  . Highest education level: Not on file  Occupational History  . Not on file  Social Needs  . Financial resource strain: Not on file  . Food insecurity:    Worry: Not on file    Inability: Not on file  . Transportation needs:    Medical: Not on file    Non-medical: Not on file  Tobacco Use  . Smoking status: Never Smoker  . Smokeless tobacco: Never Used  Substance and Sexual Activity  . Alcohol use: No  . Drug use: No  . Sexual activity: Not Currently  Lifestyle  . Physical activity:    Days per week: Not on file    Minutes per session: Not on file  .  Stress: Not on file  Relationships  . Social connections:    Talks on phone: Not on file    Gets together: Not on file    Attends religious service: Not on file    Active member of club or organization: Not on file    Attends meetings of clubs or organizations: Not on file    Relationship status: Not on file  . Intimate partner violence:    Fear of current or ex partner: Not on file    Emotionally abused: Not on file    Physically abused: Not on file    Forced sexual activity: Not on file  Other Topics Concern  . 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      Has two children and two grandchildren.      Does not have a living will.  Desires CPR, would not want prolonged life support if futile.    Past Surgical History:  Procedure Laterality Date  . APPENDECTOMY    . BREAST BIOPSY    . TUBAL LIGATION      Family History  Problem Relation Age of Onset  . Diabetes Father   .  Hypertension Father   . Hyperlipidemia Father   . Stroke Father   . Diabetes Sister   . Hyperlipidemia Sister   . Hypertension Sister   . Lung cancer Paternal Uncle   . Diabetes Paternal Grandmother   . Heart disease Paternal Grandfather     No Known Allergies  Current Outpatient Medications on File Prior to Visit  Medication Sig Dispense Refill  . amLODipine (NORVASC) 5 MG tablet Take 1 tablet (5 mg total) by mouth daily. For blood pressure. 90 tablet 3  . aspirin EC 81 MG tablet Take 81 mg by mouth daily.    . Blood Glucose Monitoring Suppl (ONE TOUCH ULTRA 2) w/Device KIT Use as instructed to test blood sugar daily. Dx is E11.9 1 each 0  . carvedilol (COREG) 6.25 MG tablet Take 1 tablet (6.25 mg total) by mouth at bedtime. For blood pressure. 90 tablet 3  . dapagliflozin propanediol (FARXIGA) 5 MG TABS tablet Take 5 mg by mouth daily. For diabetes. 90 tablet 3  . glucose blood (ONE TOUCH ULTRA TEST) test strip OneTouch Use as instructed to test blood sugar  daily. Dx is E11.9 100 each 5  . losartan (COZAAR) 100 MG tablet Take 1 tablet (100 mg total) by mouth daily. For blood pressure. 90 tablet 0  . ONE TOUCH LANCETS MISC Use as instructed to test blood sugar daily. Dx is E11.9 200 each 2  . pantoprazole (PROTONIX) 20 MG tablet Take 1 tablet (20 mg total) by mouth daily. 90 tablet 1  . rosuvastatin (CRESTOR) 20 MG tablet Take 1 tablet (20 mg total) by mouth at bedtime. 90 tablet 3  . sitaGLIPtin-metformin (JANUMET) 50-1000 MG tablet Take 1 tablet by mouth 2 (two) times daily with a meal. For diabetes. 180 tablet 3   No current facility-administered medications on file prior to visit.     BP 140/80   Pulse 83   Temp 97.8 F (36.6 C) (Tympanic)   Ht 5' 2.5" (1.588 m)   Wt 154 lb 4 oz (70 kg)   SpO2 97%   BMI 27.76 kg/m    Objective:   Physical Exam  Constitutional: She appears well-nourished.  Neck: Neck supple.  Cardiovascular: Normal rate and regular rhythm.  Respiratory: Effort normal and breath sounds normal.  Skin: Skin is warm and dry.           Assessment & Plan:

## 2019-06-05 LAB — HM DIABETES EYE EXAM

## 2019-06-08 ENCOUNTER — Encounter: Payer: Self-pay | Admitting: Primary Care

## 2019-07-13 LAB — HM DEXA SCAN: HM Dexa Scan: NORMAL

## 2019-07-17 LAB — HM MAMMOGRAPHY

## 2019-08-13 ENCOUNTER — Other Ambulatory Visit: Payer: Self-pay | Admitting: Primary Care

## 2019-08-13 DIAGNOSIS — K219 Gastro-esophageal reflux disease without esophagitis: Secondary | ICD-10-CM

## 2019-08-13 DIAGNOSIS — I10 Essential (primary) hypertension: Secondary | ICD-10-CM

## 2019-08-13 MED ORDER — PANTOPRAZOLE SODIUM 20 MG PO TBEC: 20.0000 mg | DELAYED_RELEASE_TABLET | Freq: Every day | ORAL | 2 refills | Status: DC

## 2019-09-18 ENCOUNTER — Ambulatory Visit (INDEPENDENT_AMBULATORY_CARE_PROVIDER_SITE_OTHER): Payer: Medicare Other

## 2019-09-18 ENCOUNTER — Ambulatory Visit: Payer: Medicare Other

## 2019-09-18 DIAGNOSIS — Z23 Encounter for immunization: Secondary | ICD-10-CM | POA: Diagnosis not present

## 2019-11-26 ENCOUNTER — Ambulatory Visit: Payer: Medicare Other | Admitting: Primary Care

## 2019-11-28 ENCOUNTER — Other Ambulatory Visit: Payer: Self-pay

## 2019-11-28 ENCOUNTER — Encounter: Payer: Self-pay | Admitting: Primary Care

## 2019-11-28 ENCOUNTER — Ambulatory Visit (INDEPENDENT_AMBULATORY_CARE_PROVIDER_SITE_OTHER): Payer: Medicare Other | Admitting: Primary Care

## 2019-11-28 VITALS — BP 136/80 | HR 80 | Temp 97.7°F | Ht 62.5 in | Wt 156.6 lb

## 2019-11-28 DIAGNOSIS — E119 Type 2 diabetes mellitus without complications: Secondary | ICD-10-CM

## 2019-11-28 DIAGNOSIS — I10 Essential (primary) hypertension: Secondary | ICD-10-CM | POA: Diagnosis not present

## 2019-11-28 DIAGNOSIS — N1831 Chronic kidney disease, stage 3a: Secondary | ICD-10-CM

## 2019-11-28 LAB — BASIC METABOLIC PANEL
BUN: 17 mg/dL (ref 6–23)
CO2: 23 mEq/L (ref 19–32)
Calcium: 9.5 mg/dL (ref 8.4–10.5)
Chloride: 107 mEq/L (ref 96–112)
Creatinine, Ser: 1.12 mg/dL (ref 0.40–1.20)
GFR: 48.16 mL/min — ABNORMAL LOW (ref >60.00)
Glucose, Bld: 143 mg/dL — ABNORMAL HIGH (ref 70–99)
Potassium: 4.4 mEq/L (ref 3.5–5.1)
Sodium: 139 mEq/L (ref 135–145)

## 2019-11-28 LAB — POCT GLYCOSYLATED HEMOGLOBIN (HGB A1C): Hemoglobin A1C: 7.7 % — AB (ref 4.0–5.6)

## 2019-11-28 MED ORDER — AMLODIPINE BESYLATE 10 MG PO TABS
10.0000 mg | ORAL_TABLET | Freq: Every day | ORAL | 0 refills | Status: DC
Start: 2019-11-28 — End: 2020-01-01

## 2019-11-28 MED ORDER — METHYLPREDNISOLONE ACETATE 80 MG/ML IJ SUSP: 80.0000 mg | Freq: Once | INTRAMUSCULAR | Status: DC

## 2019-11-28 NOTE — Patient Instructions (Addendum)
We've increased the dose of your amlodipine blood pressure medication to 10 mg. You may take two of the 5 mg tablets until your current bottle is empty.   Continue to monitor your blood pressure, please notify me if you see readings at or above 130/90.  Resume your diabetes medications.   It is important that you improve your diet. Please limit carbohydrates in the form of white bread, rice, pasta, sweets, fast food, fried food, sugary drinks, etc. Increase your consumption of fresh fruits and vegetables, whole grains, lean protein.  Ensure you are consuming 64 ounces of water daily.  Stop by the lab prior to leaving today. I will notify you of your results once received.   Please schedule a physical with me in 3-4 months. You may also schedule a lab only appointment 3-4 days prior. We will discuss your lab results in detail during your physical.  It was a pleasure to see you today!

## 2019-11-28 NOTE — Assessment & Plan Note (Signed)
Above goal in the setting of CKD Stage III and diabetes. Would like to see her below 130/90, numerous readings above this goal including home readings.  Increase Amlodipine to 10 mg. Continue losartan 100 mg.  She will monitor BP at home and report continued readings at or above 130/90.

## 2019-11-28 NOTE — Progress Notes (Signed)
Subjective:    Patient ID: Brenda Contreras, female    DOB: 26-Aug-1951, 68 y.o.   MRN: [id number]  HPI  Brenda Contreras is a 68 year old female who presents today for follow up.1  ) Type 2 Diabetes:  Current medications include: sitagliptin-metformin 50-1000 mg BID, Farxiga 5 mg. She's been out of her medications for two weeks due to the mail order pharmacy.   She is checking her blood glucose 1 times daily and is getting readings of:  AM fasting: 160's-mid 200's.  Last A1C: 6.3 in March 2020, 7.7 today Last Eye Exam: Due in June 2021 Last Foot Exam: Due in March 2021 Pneumonia Vaccination: Completed last in 2018 ACE/ARB: Losartan Statin: Crestor  She endorses a poor diet and has had a lot of personal stress.   2) CKD: Noted on labs during 2019 and 2020 with GFR ranging 48-59, creatinine ranging 0.98 to 1.16.   BP Readings from Last 3 Encounters:  11/28/19 136/80  05/25/19 140/80  02/27/19 130/86   She is checking her BP at home which is running 130's-140's/80's. She is compliant to amlodipine 5 mg and losartan 100 mg. She denies chest pain, dizziness.   Review of Systems  Eyes: Negative for visual disturbance.  Respiratory: Negative for shortness of breath.   Cardiovascular: Negative for chest pain.  Neurological: Negative for dizziness.       Past Medical History:  Diagnosis Date  . Diabetes mellitus   . 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 Dizziness   . GERD (gastroesophageal reflux disease)   . High cholesterol   . Hypertension   . Vaginal dryness      Social History   Socioeconomic History  . Marital status: Widowed    Spouse name: Not on file  . Number of children: Not on file  . Years of education: Not on file  . Highest education level: Not on file  Occupational History  . Not on file  Social Needs  . Financial resource strain: Not on file  . Food insecurity    Worry: Not on file    Inability: Not on file  . Transportation needs   Medical: Not on file    Non-medical: Not on file  Tobacco Use  . Smoking status: Never Smoker  . Smokeless tobacco: Never Used  Substance and Sexual Activity  . Alcohol use: No  . Drug use: No  . Sexual activity: Not Currently  Lifestyle  . Physical activity    Days per week: Not on file    Minutes per session: Not on file  . Stress: Not on file  Relationships  . Social Herbalist on phone: Not on file    Gets together: Not on file    Attends religious service: Not on file    Active member of club or organization: Not on file    Attends meetings of clubs or organizations: Not on file    Relationship status: Not on file  . Intimate partner violence    Fear of current or ex partner: Not on file    Emotionally abused: Not on file    Physically abused: Not on file    Forced sexual activity: Not on file  Other Topics Concern  . 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      Has two children and two grandchildren.      Does  not have a living will.  Desires CPR, would not want prolonged life support if futile.    Past Surgical History:  Procedure Laterality Date  . APPENDECTOMY    . BREAST BIOPSY    . TUBAL LIGATION      Family History  Problem Relation Age of Onset  . Diabetes Father   . Hypertension Father   . Hyperlipidemia Father   . Stroke Father   . Diabetes Sister   . Hyperlipidemia Sister   . Hypertension Sister   . Lung cancer Paternal Uncle   . Diabetes Paternal Grandmother   . Heart disease Paternal Grandfather     No Known Allergies  Current Outpatient Medications on File Prior to Visit  Medication Sig Dispense Refill  . aspirin EC 81 MG tablet Take 81 mg by mouth daily.    . Blood Glucose Monitoring Suppl (ONE TOUCH ULTRA 2) w/Device KIT Use as instructed to test blood sugar daily. Dx is E11.9 1 each 0  . carvedilol (COREG) 6.25 MG tablet Take 1 tablet (6.25 mg total) by mouth at bedtime. For blood  pressure. 90 tablet 3  . dapagliflozin propanediol (FARXIGA) 5 MG TABS tablet Take 5 mg by mouth daily. For diabetes. 90 tablet 3  . glucose blood (ONE TOUCH ULTRA TEST) test strip OneTouch Use as instructed to test blood sugar daily. Dx is E11.9 100 each 5  . losartan (COZAAR) 100 MG tablet TAKE 1 TABLET BY MOUTH ONCE DAILY FOR BLOOD PRESSURE 90 tablet 0  . ONE TOUCH LANCETS MISC Use as instructed to test blood sugar daily. Dx is E11.9 200 each 2  . pantoprazole (PROTONIX) 20 MG tablet Take 1 tablet (20 mg total) by mouth daily. 90 tablet 2  . rosuvastatin (CRESTOR) 20 MG tablet Take 1 tablet (20 mg total) by mouth at bedtime. 90 tablet 3  . sitaGLIPtin-metformin (JANUMET) 50-1000 MG tablet Take 1 tablet by mouth 2 (two) times daily with a meal. For diabetes. 180 tablet 3   No current facility-administered medications on file prior to visit.     BP 136/80   Pulse 80   Temp 97.7 F (36.5 C) (Temporal)   Ht 5' 2.5" (1.588 m)   Wt 156 lb 9 oz (71 kg)   SpO2 98%   BMI 28.18 kg/m    Objective:   Physical Exam  Constitutional: She appears well-nourished.  Neck: Neck supple.  Cardiovascular: Normal rate and regular rhythm.  Respiratory: Effort normal and breath sounds normal.  Skin: Skin is warm and dry.  Psychiatric: She has a normal mood and affect.           Assessment & Plan:

## 2019-11-28 NOTE — Addendum Note (Signed)
Addended by: Jacqualin Combes on: 11/28/2019 02:35 PM   Modules accepted: Orders

## 2019-11-28 NOTE — Assessment & Plan Note (Signed)
Likely secondary to borderline to high blood pressure, diabetes overall has been well controlled until recently.  Consider discontinuing Farxiga if GFR drops below 45. Will gain better control of BP and diabetes.   Repeat BMP pending today. Continue to monitor.

## 2019-11-28 NOTE — Assessment & Plan Note (Signed)
Significant increase in A1C to 7.7, likely diet and stress related over the year. Also out of meds for two weeks.  Resume medications. Work on diet.  Pneumonia vaccination UTD. Foot and eye exams UTD. Managed on statin and ARB.  Follow up in 3 months.

## 2019-12-06 ENCOUNTER — Telehealth: Payer: Self-pay | Admitting: Primary Care

## 2019-12-06 ENCOUNTER — Other Ambulatory Visit: Payer: Self-pay

## 2019-12-06 DIAGNOSIS — Z20822 Contact with and (suspected) exposure to covid-19: Secondary | ICD-10-CM

## 2019-12-06 NOTE — Telephone Encounter (Signed)
Pt wanted to make Brenda Contreras aware that she went for Covid testing today at [facility].  Given recommendations and instructions for quarantine while awaiting results.   Will send as FYI

## 2019-12-06 NOTE — Telephone Encounter (Signed)
Noted  

## 2019-12-08 LAB — NOVEL CORONAVIRUS, NAA: SARS-CoV-2, NAA: DETECTED — AB

## 2019-12-10 ENCOUNTER — Telehealth: Payer: Self-pay | Admitting: *Deleted

## 2019-12-10 ENCOUNTER — Telehealth: Payer: Self-pay | Admitting: Nurse Practitioner

## 2019-12-10 NOTE — Telephone Encounter (Signed)
Brenda Fare LPN has already spoken with pt; see note attached to this phone note.

## 2019-12-10 NOTE — Telephone Encounter (Signed)
Noted. I do see positive result.  Patient needs to quarantine.

## 2019-12-10 NOTE — Telephone Encounter (Signed)
Patient left a voicemail on 12/08/19 at 9:43 am stating that she was waiting on a phone  visit with Dr. Diona Browner. Called patient back and was advised that she called Saturday because she had gotten a positive covid test result on Friday. Patient stated that when she called she got an answering service and he gave her this number to call. Patient stated that she is feeling better today and now does not feel that she needs a virtual visit.  Patient staedt that she does not have a fever, no SOB or difficulty breathing. Patient stated that she did get a call from a NP this morning. Patient stated that she has a loss her taste and smell and does not have an appetite. Advised patient that she needs to make sure that she stays hydrated and eats even though she can not taste or smell. Advised patient if she does not continue to improve to call the office and she verbalized understanding. ER precautions given to patient.

## 2019-12-10 NOTE — Telephone Encounter (Signed)
Hilda Contreras - Client TELEPHONE ADVICE RECORD [facility] Patient Name: Brenda Contreras Gender: Female DOB: 1951-09-26 Age: 68 Y 27 M 19 D Return Phone Number: [phone] (Primary) Address: City/State/Zip: [place] [address] Client Brenda Contreras - Client Client [facility] [place] [facility] Physician Alma Friendly - NP Contact Type Call Who Is Calling Patient / Member / Family / Caregiver Call Type Triage / Clinical Relationship To Patient Self Return Phone Number [phone] (Primary) Chief Complaint Diarrhea Reason for Call Symptomatic / Request for Health Information Initial Comment Caller tested positive for covid. Caller lost taste/ smell and has a cough. She has nasal congestion and diarrhea. Translation No Nurse Assessment Nurse: Tressia Danas, RN, [place] Date/Time (Eastern Time): 12/08/2019 9:20:42 AM Confirm and document reason for call. If symptomatic, describe symptoms. ---Caller tested positive for covid. Caller lost taste/smell and has a cough. She has nasal congestion and diarrhea. tested Thursday. no fever no shortness of breath Has the patient had close contact with a person known or suspected to have the novel coronavirus illness OR traveled / lives in area with major community spread (including international travel) in the last 14 days from the onset of symptoms? * If Asymptomatic, screen for exposure and travel within the last 14 days. ---Yes Does the patient have any new or worsening symptoms? ---Yes Will a triage be completed? ---Yes Related visit to physician within the last 2 weeks? ---Yes Does the PT have any chronic conditions? (i.e. diabetes, asthma, this includes High risk factors for pregnancy, etc.) ---Yes List chronic conditions. ---diabetes, High Blood pressure Is this a behavioral health or substance abuse call? ---No Guidelines Guideline Title Affirmed Question  Affirmed Notes Nurse Date/Time (Eastern Time) Coronavirus (COVID-19) - Diagnosed or Suspected [1] HIGH RISK patient (e.g., age > 5 years, diabetes, heart or lung disease, weak immune system) AND [2] new or worsening symptoms [place], RN, Earnest Bailey 12/08/2019 9:22:01 AM PLEASE NOTE: All timestamps contained within this report are represented as [place] Standard Time. CONFIDENTIALTY NOTICE: This fax transmission is intended only for the addressee. It contains information that is legally privileged, confidential or otherwise protected from use or disclosure. If you are not the intended recipient, you are strictly prohibited from reviewing, disclosing, copying using or disseminating any of this information or taking any action in reliance on or regarding this information. If you have received this fax in error, please notify us immediately by telephone so that we can arrange for its return to [place]. Phone: [phone], Toll-Free: [phone], Fax: [phone] Page: 2 of 2 Call Id: [id number] [place]. Time Eilene Ghazi Time) Disposition Final User 12/08/2019 9:24:28 AM Call PCP Now Yes Tressia Danas, RN, Gale Journey Disagree/Comply Comply Caller Understands Yes PreDisposition Did not know what to do Care Advice Given Per Guideline CALL PCP NOW: * I'll page the on-call provider now. If you haven't heard from the provider (or me) within 30 minutes, call again. CARE ADVICE given per CORONAVIRUS (COVID-19) - DIAGNOSED OR SUSPECTED (Adult) guideline. * You become worse. CALL BACK IF: FEVER MEDICINES - EXTRA NOTES AND WARNINGS: Comments User: Dennard Nip, RN Date/Time (Eastern Time): 12/08/2019 9:30:17 AM no one on call. provided the Tele visit number for pt to have a tele medicine visit today. caller states she will call them now

## 2019-12-10 NOTE — Telephone Encounter (Signed)
Called to Discuss with patient about Covid symptoms and the use of bamlanivimab, a monoclonal antibody infusion for those with mild to moderate Covid symptoms and at a high risk of hospitalization.     Pt is qualified for this infusion at the [facility] infusion center due to co-morbid conditions and/or a member of an at-risk group.      Patient is managed for the following: Patient Active Problem List   Diagnosis Date Noted  . Stage 3a chronic kidney disease 11/28/2019  . Chest pain 02/21/2018  . Encounter for diabetic foot exam ([place]) 05/05/2017  . Medicare annual wellness visit, initial 04/25/2017  . HTN (hypertension) 01/13/2016  . GERD (gastroesophageal reflux disease) 01/13/2016  . Diabetes ([place]) 01/13/2016  . HLD (hyperlipidemia) 01/13/2016    Patient states that she is much improved and declines infusion at this time.

## 2019-12-18 ENCOUNTER — Other Ambulatory Visit: Payer: Self-pay

## 2019-12-18 DIAGNOSIS — I10 Essential (primary) hypertension: Secondary | ICD-10-CM

## 2019-12-19 ENCOUNTER — Other Ambulatory Visit: Payer: Medicare Other

## 2019-12-19 ENCOUNTER — Ambulatory Visit: Payer: Medicare Other | Attending: Internal Medicine

## 2019-12-19 DIAGNOSIS — Z20822 Contact with and (suspected) exposure to covid-19: Secondary | ICD-10-CM

## 2019-12-19 MED ORDER — LOSARTAN POTASSIUM 100 MG PO TABS: 100.0000 mg | ORAL_TABLET | Freq: Every day | ORAL | 1 refills | Status: DC

## 2019-12-20 LAB — NOVEL CORONAVIRUS, NAA: SARS-CoV-2, NAA: NOT DETECTED

## 2020-01-01 ENCOUNTER — Other Ambulatory Visit: Payer: Self-pay

## 2020-01-01 DIAGNOSIS — I10 Essential (primary) hypertension: Secondary | ICD-10-CM

## 2020-01-02 MED ORDER — AMLODIPINE BESYLATE 10 MG PO TABS: 10.0000 mg | ORAL_TABLET | Freq: Every day | ORAL | 1 refills | Status: DC

## 2020-01-25 ENCOUNTER — Other Ambulatory Visit: Payer: Self-pay | Admitting: Primary Care

## 2020-01-25 MED ORDER — GLUCOSE BLOOD VI STRP: ORAL_STRIP | 5 refills | Status: DC

## 2020-04-30 ENCOUNTER — Other Ambulatory Visit: Payer: Self-pay | Admitting: Primary Care

## 2020-04-30 DIAGNOSIS — I10 Essential (primary) hypertension: Secondary | ICD-10-CM

## 2020-04-30 DIAGNOSIS — E785 Hyperlipidemia, unspecified: Secondary | ICD-10-CM

## 2020-04-30 DIAGNOSIS — E119 Type 2 diabetes mellitus without complications: Secondary | ICD-10-CM

## 2020-05-01 ENCOUNTER — Emergency Department (HOSPITAL_COMMUNITY): Payer: Medicare Other

## 2020-05-01 ENCOUNTER — Emergency Department (HOSPITAL_COMMUNITY)
Admission: EM | Admit: 2020-05-01 | Discharge: 2020-05-01 | Disposition: A | Discharge status: Home / Self Care | Payer: Medicare Other | Attending: Emergency Medicine | Admitting: Emergency Medicine

## 2020-05-01 ENCOUNTER — Other Ambulatory Visit: Payer: Self-pay

## 2020-05-01 ENCOUNTER — Encounter (HOSPITAL_COMMUNITY): Payer: Self-pay | Admitting: Emergency Medicine

## 2020-05-01 DIAGNOSIS — Z7982 Long term (current) use of aspirin: Secondary | ICD-10-CM | POA: Diagnosis not present

## 2020-05-01 DIAGNOSIS — K5792 Diverticulitis of intestine, part unspecified, without perforation or abscess without bleeding: Secondary | ICD-10-CM | POA: Diagnosis not present

## 2020-05-01 DIAGNOSIS — N183 Chronic kidney disease, stage 3 unspecified: Secondary | ICD-10-CM | POA: Diagnosis not present

## 2020-05-01 DIAGNOSIS — I700 Atherosclerosis of aorta: Secondary | ICD-10-CM | POA: Diagnosis not present

## 2020-05-01 DIAGNOSIS — R350 Frequency of micturition: Secondary | ICD-10-CM | POA: Diagnosis not present

## 2020-05-01 DIAGNOSIS — N289 Disorder of kidney and ureter, unspecified: Secondary | ICD-10-CM | POA: Diagnosis not present

## 2020-05-01 DIAGNOSIS — I129 Hypertensive chronic kidney disease with stage 1 through stage 4 chronic kidney disease, or unspecified chronic kidney disease: Secondary | ICD-10-CM | POA: Diagnosis not present

## 2020-05-01 DIAGNOSIS — Z79899 Other long term (current) drug therapy: Secondary | ICD-10-CM | POA: Diagnosis not present

## 2020-05-01 DIAGNOSIS — R10815 Periumbilic abdominal tenderness: Secondary | ICD-10-CM | POA: Insufficient documentation

## 2020-05-01 DIAGNOSIS — R911 Solitary pulmonary nodule: Secondary | ICD-10-CM | POA: Insufficient documentation

## 2020-05-01 DIAGNOSIS — R1030 Lower abdominal pain, unspecified: Secondary | ICD-10-CM | POA: Diagnosis present

## 2020-05-01 DIAGNOSIS — K8020 Calculus of gallbladder without cholecystitis without obstruction: Secondary | ICD-10-CM | POA: Diagnosis not present

## 2020-05-01 LAB — CBC
HCT: 38.4 % (ref 36.0–46.0)
Hemoglobin: 11.7 g/dL — ABNORMAL LOW (ref 12.0–15.0)
MCH: 27.6 pg (ref 26.0–34.0)
MCHC: 30.5 g/dL (ref 30.0–36.0)
MCV: 90.6 fL (ref 80.0–100.0)
Platelets: 206 10*3/uL (ref 150–400)
RBC: 4.24 MIL/uL (ref 3.87–5.11)
RDW: 13.9 % (ref 11.5–15.5)
WBC: 11.1 10*3/uL — ABNORMAL HIGH (ref 4.0–10.5)
nRBC: 0 % (ref 0.0–0.2)

## 2020-05-01 LAB — URINALYSIS, ROUTINE W REFLEX MICROSCOPIC
Bacteria, UA: NONE SEEN
Bilirubin Urine: NEGATIVE
Glucose, UA: >=500 mg/dL — AB
Hgb urine dipstick: NEGATIVE
Ketones, ur: NEGATIVE mg/dL
Nitrite: NEGATIVE
Protein, ur: NEGATIVE mg/dL
Specific Gravity, Urine: 1.008 (ref 1.005–1.030)
pH: 6 (ref 5.0–8.0)

## 2020-05-01 LAB — COMPREHENSIVE METABOLIC PANEL
ALT: 21 U/L (ref 0–44)
AST: 21 U/L (ref 15–41)
Albumin: 3.8 g/dL (ref 3.5–5.0)
Alkaline Phosphatase: 81 U/L (ref 38–126)
Anion gap: 9 (ref 5–15)
BUN: 10 mg/dL (ref 8–23)
CO2: 20 mmol/L — ABNORMAL LOW (ref 22–32)
Calcium: 9.5 mg/dL (ref 8.9–10.3)
Chloride: 106 mmol/L (ref 98–111)
Creatinine, Ser: 0.97 mg/dL (ref 0.44–1.00)
GFR calc Af Amer: >60 mL/min (ref >60)
GFR calc non Af Amer: 60 mL/min — ABNORMAL LOW (ref >60)
Glucose, Bld: 201 mg/dL — ABNORMAL HIGH (ref 70–99)
Potassium: 4.3 mmol/L (ref 3.5–5.1)
Sodium: 135 mmol/L (ref 135–145)
Total Bilirubin: 1.1 mg/dL (ref 0.3–1.2)
Total Protein: 6.8 g/dL (ref 6.5–8.1)

## 2020-05-01 LAB — LIPASE, BLOOD: Lipase: 29 U/L (ref 11–51)

## 2020-05-01 MED ORDER — SODIUM CHLORIDE 0.9 % IV BOLUS
1000.0000 mL | Freq: Once | INTRAVENOUS | Status: AC
  Administered 2020-05-01: 1000 mL via INTRAVENOUS

## 2020-05-01 MED ORDER — METRONIDAZOLE 500 MG PO TABS
500.0000 mg | ORAL_TABLET | Freq: Two times a day (BID) | ORAL | 0 refills | Status: DC
Start: 2020-05-01 — End: 2020-05-15

## 2020-05-01 MED ORDER — METRONIDAZOLE 500 MG PO TABS
500.0000 mg | ORAL_TABLET | Freq: Once | ORAL | Status: AC
  Administered 2020-05-01: 500 mg via ORAL
  Filled 2020-05-01: qty 1

## 2020-05-01 MED ORDER — IOHEXOL 350 MG/ML SOLN
100.0000 mL | Freq: Once | INTRAVENOUS | Status: AC | PRN
  Administered 2020-05-01: 100 mL via INTRAVENOUS

## 2020-05-01 MED ORDER — SODIUM CHLORIDE 0.9% FLUSH: 3.0000 mL | Freq: Once | INTRAVENOUS | Status: DC

## 2020-05-01 MED ORDER — CIPROFLOXACIN HCL 500 MG PO TABS
500.0000 mg | ORAL_TABLET | Freq: Once | ORAL | Status: AC
  Administered 2020-05-01: 500 mg via ORAL
  Filled 2020-05-01: qty 1

## 2020-05-01 MED ORDER — CIPROFLOXACIN HCL 500 MG PO TABS
500.0000 mg | ORAL_TABLET | Freq: Two times a day (BID) | ORAL | 0 refills | Status: DC
Start: 2020-05-01 — End: 2020-05-15

## 2020-05-01 NOTE — ED Notes (Signed)
Returns from CT 

## 2020-05-01 NOTE — ED Triage Notes (Signed)
Pt reports lower abdominal pain all day and now reports it is moving to the right side and back.  Feels nausea as well.

## 2020-05-01 NOTE — ED Provider Notes (Signed)
[facility] DEPARTMENT Provider Note   CSN: [id number] Arrival date & time: 05/01/20  0002     History Chief Complaint  Patient presents with  . Abdominal Pain    Brenda Contreras is a 69 y.o. female.  HPI 69 year old female presents with sudden onset abdominal pain.  Started last evening.  Was sudden and severe.  Since arriving here it has improved though still comes and goes.  Pain is mostly midline lower abdominal.  At some point last night it seemed to radiate around to her low back on the right side.  No vomiting or diarrhea.  No fevers.  Brenda Contreras states right now there is 0 pain.  Brenda Contreras does not have any dysuria but has been having urinary frequency and urgency.   Past Medical History:  Diagnosis Date  . Diabetes mellitus   . 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 Dizziness   . GERD (gastroesophageal reflux disease)   . High cholesterol   . Hypertension   . Vaginal dryness     Patient Active Problem List   Diagnosis Date Noted  . Stage 3a chronic kidney disease 11/28/2019  . Chest pain 02/21/2018  . Encounter for diabetic foot exam ([place]) 05/05/2017  . Medicare annual wellness visit, initial 04/25/2017  . HTN (hypertension) 01/13/2016  . GERD (gastroesophageal reflux disease) 01/13/2016  . Diabetes ([place]) 01/13/2016  . HLD (hyperlipidemia) 01/13/2016    Past Surgical History:  Procedure Laterality Date  . APPENDECTOMY    . BREAST BIOPSY    . TUBAL LIGATION       OB History   No obstetric history on file.     Family History  Problem Relation Age of Onset  . Diabetes Father   . Hypertension Father   . Hyperlipidemia Father   . Stroke Father   . Diabetes Sister   . Hyperlipidemia Sister   . Hypertension Sister   . Lung cancer Paternal Uncle   . Diabetes Paternal Grandmother   . Heart disease Paternal Grandfather     Social History   Tobacco Use  . Smoking status: Never Smoker  . Smokeless tobacco: Never Used    Substance Use Topics  . Alcohol use: No  . Drug use: No    Home Medications Prior to Admission medications   Medication Sig Start Date End Date Taking? Authorizing Provider  acetaminophen (TYLENOL) 500 MG tablet Take 1,000 mg by mouth every 6 (six) hours as needed for mild pain.   Yes [provider]  amLODipine (NORVASC) 10 MG tablet Take 1 tablet (10 mg total) by mouth daily. For blood pressure. 01/02/20  Yes Pleas Koch, NP  aspirin EC 81 MG tablet Take 81 mg by mouth daily.   Yes [provider]  carvedilol (COREG) 6.25 MG tablet Take 1 tablet (6.25 mg total) by mouth at bedtime. For blood pressure. 03/16/19  Yes Pleas Koch, NP  dapagliflozin propanediol (FARXIGA) 5 MG TABS tablet Take 5 mg by mouth daily. For diabetes. 05/10/19  Yes Pleas Koch, NP  losartan (COZAAR) 100 MG tablet Take 1 tablet (100 mg total) by mouth daily. 12/19/19  Yes Pleas Koch, NP  pantoprazole (PROTONIX) 20 MG tablet Take 1 tablet (20 mg total) by mouth daily. 08/13/19  Yes Pleas Koch, NP  rosuvastatin (CRESTOR) 20 MG tablet Take 1 tablet (20 mg total) by mouth at bedtime. 03/16/19 05/01/20 Yes Pleas Koch, NP  sitaGLIPtin-metformin Carlyn Reichert)  50-1000 MG tablet Take 1 tablet by mouth 2 (two) times daily with a meal. For diabetes. 03/16/19  Yes Pleas Koch, NP  Blood Glucose Monitoring Suppl (ONE TOUCH ULTRA 2) w/Device KIT Use as instructed to test blood sugar daily. Dx is E11.9 01/12/19   Pleas Koch, NP  ciprofloxacin (CIPRO) 500 MG tablet Take 1 tablet (500 mg total) by mouth 2 (two) times daily. One po bid x 7 days 05/01/20   Sherwood Gambler, MD  glucose blood (ONE TOUCH ULTRA TEST) test strip OneTouch Use as instructed to test blood sugar daily. Dx is E11.9 01/25/20   Pleas Koch, NP  metroNIDAZOLE (FLAGYL) 500 MG tablet Take 1 tablet (500 mg total) by mouth 2 (two) times daily. One po bid x 7 days 05/01/20   Sherwood Gambler, MD  ONE [facility]  LANCETS MISC Use as instructed to test blood sugar daily. Dx is E11.9 01/12/19   Pleas Koch, NP    Allergies    Patient has no known allergies.  Review of Systems   Review of Systems  Constitutional: Negative for fever.  Gastrointestinal: Positive for abdominal pain. Negative for diarrhea and vomiting.  Genitourinary: Positive for frequency and urgency. Negative for dysuria.  Musculoskeletal: Positive for back pain.  All other systems reviewed and are negative.   Physical Exam Updated Vital Signs BP (!) 162/76 (BP Location: Left Arm)   Pulse 89   Temp 98.3 F (36.8 C) (Oral)   Resp 16   Ht '5\' 3"'  (1.6 m)   Wt 68 kg   SpO2 99%   BMI 26.57 kg/m   Physical Exam Vitals and nursing note reviewed.  Constitutional:      General: Brenda Contreras is not in acute distress.    Appearance: Brenda Contreras is well-developed. Brenda Contreras is not ill-appearing or diaphoretic.  HENT:     Head: Normocephalic and atraumatic.     Right Ear: External ear normal.     Left Ear: External ear normal.     Nose: Nose normal.  Eyes:     General:        Right eye: No discharge.        Left eye: No discharge.  Cardiovascular:     Rate and Rhythm: Normal rate and regular rhythm.     Heart sounds: Normal heart sounds.  Pulmonary:     Effort: Pulmonary effort is normal.     Breath sounds: Normal breath sounds.  Abdominal:     Palpations: Abdomen is soft.     Tenderness: There is abdominal tenderness in the suprapubic area. There is no right CVA tenderness or left CVA tenderness.  Skin:    General: Skin is warm and dry.  Neurological:     Mental Status: Brenda Contreras is alert.  Psychiatric:        Mood and Affect: Mood is not anxious.     ED Results / Procedures / Treatments   Labs (all labs ordered are listed, but only abnormal results are displayed) Labs Reviewed  CBC - Abnormal; Notable for the following components:      Result Value   WBC 11.1 (*)    Hemoglobin 11.7 (*)    All other components within normal limits   URINALYSIS, ROUTINE W REFLEX MICROSCOPIC - Abnormal; Notable for the following components:   Color, Urine STRAW (*)    Glucose, UA >=500 (*)    Leukocytes,Ua TRACE (*)    All other components within normal limits  COMPREHENSIVE METABOLIC PANEL -  Abnormal; Notable for the following components:   CO2 20 (*)    Glucose, Bld 201 (*)    GFR calc non Af Amer 60 (*)    All other components within normal limits  URINE CULTURE  LIPASE, BLOOD    EKG None  Radiology CT Angio Abd/Pel W and/or Wo Contrast  Result Date: 05/01/2020 CLINICAL DATA:  Evaluate for acute mesenteric ischemia. EXAM: CT ANGIOGRAPHY ABDOMEN AND PELVIS WITH CONTRAST AND WITHOUT CONTRAST TECHNIQUE: Multidetector CT imaging of the abdomen and pelvis was performed using the standard protocol during bolus administration of intravenous contrast. Multiplanar reconstructed images and MIPs were obtained and reviewed to evaluate the vascular anatomy. CONTRAST:  130m OMNIPAQUE IOHEXOL 350 MG/ML SOLN COMPARISON:  CT enterography 2 06/25/2013 FINDINGS: VASCULAR Aorta: Atherosclerotic disease in the abdominal aorta without aneurysm. Irregularity of the infrarenal abdominal aorta on sequence 6, image 117 at the level of the IMA. This irregularity is along the left posterior aspect of the abdominal aorta and suggestive for an intimal flap or focal dissection. There was a similar finding in this area in 2014. No significant aortic stenosis. Celiac: Patent without evidence of aneurysm, dissection, vasculitis or significant stenosis. SMA: Patent without evidence of aneurysm, dissection, vasculitis or significant stenosis. Renals: There are 2 right renal arteries without significant stenosis. 3 left renal arteries without significant stenosis. IMA: Patent without evidence of aneurysm, dissection, vasculitis or significant stenosis. Inflow: Atherosclerotic calcifications involving the iliac arteries without significant stenosis. Proximal Outflow: Proximal  femoral arteries are patent bilaterally. Veins: Compression on the left common iliac vein from the right common iliac artery is normal anatomic finding. Portal veins and hepatic veins are patent. Main mesenteric veins are patent. Review of the MIP images confirms the above findings. NON-VASCULAR Lower chest: Several small peripheral and pleural based nodular densities. Pleural-based nodular density at the right lung base on sequence 8, image 57 measures up to 7 mm in this is unchanged since 2014. Some of the nodular densities were not imaged on the previous examination such as a pleural-based nodular dense in the right lower lobe measuring 4 mm on sequence 8, image 11 and a pleural-based nodule in the left lower lobe measuring 5 mm on image 15. No pleural effusions. Hepatobiliary: Liver may have slightly decreased attenuation. No discrete liver lesion. No biliary dilatation. One or two small calcified gallstones without gallbladder inflammation. Pancreas: Unremarkable. No pancreatic ductal dilatation or surrounding inflammatory changes. Spleen: Normal in size without focal abnormality. Adrenals/Urinary Tract: Normal appearance of the adrenal glands. 1.0 cm hypodensity in the left kidney lower pole is indeterminate. Hounsfield units measure between 40 and 50 and there could be a septation. Additional small exophytic low-density along the posterior aspect of the left kidney that is too small to definitively characterize. No hydronephrosis. Urinary bladder is unremarkable. Stomach/Bowel: Normal appearance of the stomach and small bowel. Scattered colonic diverticula with wall thickening and pericolonic inflammatory changes involving the sigmoid colon on sequence 6, image 184. There are diverticula in this area of inflammatory change. No evidence for an extraluminal fluid collection or abscess. Negative for extraluminal air. Lymphatic: Small periaortic lymph nodes are similar to the exam from 2014. No suspicious  abdominal or pelvic lymphadenopathy. Reproductive: Uterus and bilateral adnexa are unremarkable. Other: Trace ascites in the pelvis. No significant abdominal hernias. Musculoskeletal: Degenerative facet arthropathy in the lumbar spine. IMPRESSION: VASCULAR 1. Mesenteric arteries are widely patent without significant stenosis. Multiple renal arteries. No evidence for mesenteric ischemia. 2. 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 This has not significantly changed since 2014. Negative for an abdominal aortic aneurysm. 3.  Aortic Atherosclerosis (ICD10-I70.0). NON-VASCULAR 1. Acute inflammation involving the sigmoid colon. Findings are most compatible with acute diverticulitis based on the scattered colonic diverticula. No evidence for an abscess. Consider follow-up GI consultation and colonoscopy to exclude another etiology for the colonic inflammation. 2. Multiple small peripheral pulmonary nodules. Some of these nodules are unchanged since 02/30/2014 and compatible with benign etiologies. Some of the nodules were not imaged on the previous examination and the largest nodules measure up to 5 mm. No follow-up needed if patient is low-risk (and has no known or suspected primary neoplasm). Non-contrast chest CT can be considered in 12 months if patient is high-risk. This recommendation follows the consensus statement: Guidelines for Management of Incidental Pulmonary Nodules Detected on CT Images: From the Fleischner Society 2017; Radiology 2017; 284:228-243. 3. Cholelithiasis.  No evidence for gallbladder inflammation. 4. **An incidental finding of potential clinical significance has been found. Indeterminate 1.0 cm left renal lesion. This left renal lesion is new since 2014. Although this renal lesion is low-density and could represent a complex cyst, the lesion is indeterminate based on Hounsfield units. Consider further  characterization with abdominal MRI, with and without contrast, to evaluate this small renal lesion.** Electronically Signed   By: Markus Daft M.D.   On: 05/01/2020 10:00    Procedures Procedures (including critical care time)  Medications Ordered in ED Medications  sodium chloride flush (NS) 0.9 % injection 3 mL (3 mLs Intravenous Not Given 05/01/20 0834)  sodium chloride 0.9 % bolus 1,000 mL (0 mLs Intravenous Stopped 05/01/20 1042)  iohexol (OMNIPAQUE) 350 MG/ML injection 100 mL (100 mLs Intravenous Contrast Given 05/01/20 0908)  ciprofloxacin (CIPRO) tablet 500 mg (500 mg Oral Given 05/01/20 1047)  metroNIDAZOLE (FLAGYL) tablet 500 mg (500 mg Oral Given 05/01/20 1047)    ED Course  I have reviewed the triage vital signs and the nursing notes.  Pertinent labs & imaging results that were available during my care of the patient were reviewed by me and considered in my medical decision making (see chart for details).    MDM Rules/Calculators/A&P                      Given the sudden nature of her pain along with reported severity, CT angio ordered.  Patient CT scan personally reviewed and shows no obvious ischemia.  Does show probable diverticulitis.  Either has UTI and diverticulitis or is having irritation from the diverticulitis.  However Brenda Contreras is otherwise well-appearing and her labs are overall unremarkable besides mild hyperglycemia,  mild leukocytosis.  Brenda Contreras otherwise is well-appearing and declines pain medicine.  Will treat with Cipro and Flagyl which should help cover diverticulitis and possible UTI.  Will send urine for culture.  Brenda Contreras was made aware of the pulmonary nodules and renal lesion and need for follow-up.  Return precautions discussed. Final Clinical Impression(s) / ED Diagnoses Final diagnoses:  Acute diverticulitis  Pulmonary nodule  Renal lesion    Rx / DC Orders ED Discharge Orders         Ordered    ciprofloxacin (CIPRO) 500 MG tablet  2 times daily     05/01/20 1012     metroNIDAZOLE (FLAGYL) 500 MG tablet  2 times daily     05/01/20 1012           Elma Shands,  Nicki Reaper, MD 05/01/20 1102

## 2020-05-01 NOTE — Discharge Instructions (Signed)
If you develop worsening, continued, or recurrent abdominal pain, uncontrolled vomiting, fever, chest or back pain, or any other new/concerning symptoms then return to the ER for evaluation.   Your CT scan showed a couple extra findings that will need to be followed up.  The first is some lung nodules.  You will need a repeat CT scan in about 12 months.  The second is a lesion on your kidney.  This will need to be followed up with an MRI (with and without contrast) that your family doctor can order.

## 2020-05-02 LAB — URINE CULTURE: Culture: 60000 — AB

## 2020-05-03 ENCOUNTER — Telehealth: Payer: Self-pay | Admitting: Emergency Medicine

## 2020-05-03 NOTE — Telephone Encounter (Signed)
Post ED Visit - Positive Culture Follow-up  Culture report reviewed by antimicrobial stewardship pharmacist: [place] Team []  Elenor Quinones, Pharm.D. []  Heide Guile, Pharm.D., BCPS AQ-ID []  Parks Neptune, Pharm.D., BCPS []  Alycia Rossetti, Pharm.D., BCPS []  [place], [place].D., BCPS, AAHIVP []  Legrand Como, Pharm.D., BCPS, AAHIVP []  Salome Arnt, PharmD, BCPS []  Johnnette Gourd, PharmD, BCPS []  Hughes Better, PharmD, BCPS [x]  Duanne Limerick, PharmD []  Laqueta Linden, PharmD, BCPS []  Albertina Parr, PharmD  [place] Team []  Leodis Sias, PharmD []  Lindell Spar, PharmD []  Royetta Asal, PharmD []  Graylin Shiver, Rph []  Rema Fendt) Glennon Mac, PharmD []  Arlyn Dunning, PharmD []  Netta Cedars, PharmD []  Dia Sitter, PharmD []  Leone Haven, PharmD []  Gretta Arab, PharmD []  Theodis Shove, PharmD []  Peggyann Juba, PharmD []  Reuel Boom, PharmD   Positive urine culture Treated with Ciprofloxacin, organism sensitive to the same and no further patient follow-up is required at this time.  Milus Mallick 05/03/2020, 6:17 PM

## 2020-05-08 ENCOUNTER — Telehealth: Payer: Self-pay | Admitting: Primary Care

## 2020-05-08 ENCOUNTER — Ambulatory Visit (INDEPENDENT_AMBULATORY_CARE_PROVIDER_SITE_OTHER): Payer: Medicare Other | Admitting: Primary Care

## 2020-05-08 ENCOUNTER — Other Ambulatory Visit: Payer: Self-pay

## 2020-05-08 ENCOUNTER — Encounter: Payer: Self-pay | Admitting: Primary Care

## 2020-05-08 DIAGNOSIS — N281 Cyst of kidney, acquired: Secondary | ICD-10-CM

## 2020-05-08 DIAGNOSIS — K5792 Diverticulitis of intestine, part unspecified, without perforation or abscess without bleeding: Secondary | ICD-10-CM

## 2020-05-08 DIAGNOSIS — N1831 Chronic kidney disease, stage 3a: Secondary | ICD-10-CM | POA: Diagnosis not present

## 2020-05-08 DIAGNOSIS — F418 Other specified anxiety disorders: Secondary | ICD-10-CM

## 2020-05-08 HISTORY — DX: Diverticulitis of intestine, part unspecified, without perforation or abscess without bleeding: K57.92

## 2020-05-08 HISTORY — DX: Cyst of kidney, acquired: N28.1

## 2020-05-08 NOTE — Telephone Encounter (Signed)
Spoke with patient and she is claustrophobic and would like some medication to take for the MRI. Please call in medicine to her pharmacy.

## 2020-05-08 NOTE — Telephone Encounter (Signed)
Has she taken anything in the past for claustrophobia? If so, then what?

## 2020-05-08 NOTE — Assessment & Plan Note (Signed)
Lesion vs cyst, incidental finding on CT scan. MR abdomen pending.

## 2020-05-08 NOTE — Progress Notes (Signed)
Subjective:    Patient ID: Brenda Contreras, female    DOB: 04-15-1951, 69 y.o.   MRN: [id number]  HPI  This visit occurred during the SARS-CoV-2 public health emergency.  Safety protocols were in place, including screening questions prior to the visit, additional usage of staff PPE, and extensive cleaning of exam room while observing appropriate contact time as indicated for disinfecting solutions.   Brenda Contreras is a 69 year old female who presents today for emergency department follow up.  She presented to [facility] on [facility]/01[facility]20 with a chief complaint of midline lower abdominal pain that began the the evening prior. She denied nausea/vomiting or diarrhea.   During her stay in the ED she underwent CT angio which was negative for ischemia but with probable acute sigmoid diverticulitis. Labs with mild leukocytosis. She was presumed to have either acute cystitis or acute diverticulitis so she was treated with oral Cipro and Flagyl.   CT scan also with stable small peripheral pulmonary nodules,some of which appear stable from 2014, but also with larger 5 mm nodules that were not captured in 2014. Non contrasted CT chest was recommended in 12 months if she was high risk, otherwise no further imaging needed. She is a non smoker.  Also incidental finding of 1.0 cm left renal lesion that was new since 2014. Recommendation for abdominal MRI with and without contrast was recommended. She does have CKD.  Since her ED visit she's feeling better. She completed her antibiotics yesterday. She denies nausea/vomiting, diarrhea but does continue to notice mild intermittent lower abdominal pain, overall improved. She is advancing her diet as tolerated.   She is checking her BP at home which is running 120's/60-70's.  Her blood sugar levels are running 180's-200's.   BP Readings from Last 3 Encounters:  05/08/20 (!) 146/80  05/01/20 (!) 162/76  11/28/19 136/80     Review of Systems  Constitutional: Negative  for fever.  Gastrointestinal: Positive for abdominal pain. Negative for blood in stool, diarrhea, nausea and vomiting.  Neurological: Negative for dizziness.       Past Medical History:  Diagnosis Date  . Diabetes mellitus   . 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 Dizziness   . GERD (gastroesophageal reflux disease)   . High cholesterol   . Hypertension   . Vaginal dryness      Social History   Socioeconomic History  . Marital status: Widowed    Spouse name: Not on file  . Number of children: Not on file  . Years of education: Not on file  . Highest education level: Not on file  Occupational History  . Not on file  Tobacco Use  . Smoking status: Never Smoker  . Smokeless tobacco: Never Used  Substance and Sexual Activity  . Alcohol use: No  . Drug use: No  . Sexual activity: Not Currently  Other Topics Concern  . 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      Has two children and two grandchildren.      Does not have a living will.  Desires CPR, would not want prolonged life support if futile.   Social Determinants of Health   Financial Resource Strain:   . Difficulty of Paying Living Expenses:   Food Insecurity:   . Worried About Charity fundraiser in the Last Year:   . [place] in the Last Year:  Transportation Needs:   . Film/video editor (Medical):   Marland Kitchen Lack of Transportation (Non-Medical):   Physical Activity:   . Days of Exercise per Week:   . Minutes of Exercise per Session:   Stress:   . Feeling of Stress :   Social Connections:   . Frequency of Communication with Friends and Family:   . Frequency of Social Gatherings with Friends and Family:   . Attends Religious Services:   . Active Member of Clubs or Organizations:   . Attends Archivist Meetings:   Marland Kitchen Marital Status:   Intimate Partner Violence:   . Fear of Current or Ex-Partner:   . Emotionally Abused:   Marland Kitchen Physically  Abused:   . Sexually Abused:     Past Surgical History:  Procedure Laterality Date  . APPENDECTOMY    . BREAST BIOPSY    . TUBAL LIGATION      Family History  Problem Relation Age of Onset  . Diabetes Father   . Hypertension Father   . Hyperlipidemia Father   . Stroke Father   . Diabetes Sister   . Hyperlipidemia Sister   . Hypertension Sister   . Lung cancer Paternal Uncle   . Diabetes Paternal Grandmother   . Heart disease Paternal Grandfather     No Known Allergies  Current Outpatient Medications on File Prior to Visit  Medication Sig Dispense Refill  . acetaminophen (TYLENOL) 500 MG tablet Take 1,000 mg by mouth every 6 (six) hours as needed for mild pain.    Marland Kitchen amLODipine (NORVASC) 10 MG tablet Take 1 tablet (10 mg total) by mouth daily. For blood pressure. 90 tablet 1  . aspirin EC 81 MG tablet Take 81 mg by mouth daily.    . Blood Glucose Monitoring Suppl (ONE TOUCH ULTRA 2) w/Device KIT Use as instructed to test blood sugar daily. Dx is E11.9 1 each 0  . carvedilol (COREG) 6.25 MG tablet Take 1 tablet (6.25 mg total) by mouth at bedtime. For blood pressure. 90 tablet 3  . ciprofloxacin (CIPRO) 500 MG tablet Take 1 tablet (500 mg total) by mouth 2 (two) times daily. One po bid x 7 days 13 tablet 0  . dapagliflozin propanediol (FARXIGA) 5 MG TABS tablet Take 5 mg by mouth daily. For diabetes. 90 tablet 3  . glucose blood (ONE TOUCH ULTRA TEST) test strip OneTouch Use as instructed to test blood sugar daily. Dx is E11.9 100 each 5  . losartan (COZAAR) 100 MG tablet Take 1 tablet (100 mg total) by mouth daily. 90 tablet 1  . metroNIDAZOLE (FLAGYL) 500 MG tablet Take 1 tablet (500 mg total) by mouth 2 (two) times daily. One po bid x 7 days 13 tablet 0  . ONE TOUCH LANCETS MISC Use as instructed to test blood sugar daily. Dx is E11.9 200 each 2  . pantoprazole (PROTONIX) 20 MG tablet Take 1 tablet (20 mg total) by mouth daily. 90 tablet 2  . sitaGLIPtin-metformin (JANUMET)  50-1000 MG tablet Take 1 tablet by mouth 2 (two) times daily with a meal. For diabetes. 180 tablet 3  . rosuvastatin (CRESTOR) 20 MG tablet Take 1 tablet (20 mg total) by mouth at bedtime. 90 tablet 3   No current facility-administered medications on file prior to visit.    BP (!) 146/80   Pulse 92   Temp (!) 96.8 F (36 C) (Temporal)   Ht 5' 2.5" (1.588 m)   Wt 157 lb 8 oz (71.4  kg)   SpO2 97%   BMI 28.35 kg/m    Objective:   Physical Exam  Constitutional: She appears well-nourished.  Cardiovascular: Normal rate and regular rhythm.  Respiratory: Effort normal and breath sounds normal.  GI: Soft. Bowel sounds are normal. There is no abdominal tenderness.  Musculoskeletal:     Cervical back: Neck supple.  Skin: Skin is warm and dry.           Assessment & Plan:

## 2020-05-08 NOTE — Assessment & Plan Note (Signed)
Incidental finding of renal lesion to left kidney. MR abdomen pending.

## 2020-05-08 NOTE — Assessment & Plan Note (Signed)
Sigmoid diverticulitis as evidenced by on CT scan. Improving, completed treatment as prescribed.  Discussed to advance diet as tolerated. Exam today unremarkable.  ED notes, labs, imaging reviewed.

## 2020-05-08 NOTE — Patient Instructions (Addendum)
You will be contacted regarding your MRI of the abdomen.  Please let us know if you have not been contacted within two weeks.   Slowly advance your diet as discussed. Take a look at the information below.  We'll see you next week!   Diverticulitis  Diverticulitis is infection or inflammation of small pouches (diverticula) in the colon that form due to a condition called diverticulosis. Diverticula can trap stool (feces) and bacteria, causing infection and inflammation. Diverticulitis may cause severe stomach pain and diarrhea. It may lead to tissue damage in the colon that causes bleeding. The diverticula may also burst (rupture) and cause infected stool to enter other areas of the abdomen. Complications of diverticulitis can include:  Bleeding.  Severe infection.  Severe pain.  Rupture (perforation) of the colon.  Blockage (obstruction) of the colon. What are the causes? This condition is caused by stool becoming trapped in the diverticula, which allows bacteria to grow in the diverticula. This leads to inflammation and infection. What increases the risk? You are more likely to develop this condition if:  You have diverticulosis. The risk for diverticulosis increases if: ? You are overweight or obese. ? You use tobacco products. ? You do not get enough exercise.  You eat a diet that does not include enough fiber. High-fiber foods include fruits, vegetables, beans, nuts, and whole grains. What are the signs or symptoms? Symptoms of this condition may include:  Pain and tenderness in the abdomen. The pain is normally located on the left side of the abdomen, but it may occur in other areas.  Fever and chills.  Bloating.  Cramping.  Nausea.  Vomiting.  Changes in bowel routines.  Blood in your stool. How is this diagnosed? This condition is diagnosed based on:  Your medical history.  A physical exam.  Tests to make sure there is nothing else causing your  condition. These tests may include: ? Blood tests. ? Urine tests. ? Imaging tests of the abdomen, including X-rays, ultrasounds, MRIs, or CT scans. How is this treated? Most cases of this condition are mild and can be treated at home. Treatment may include:  Taking over-the-counter pain medicines.  Following a clear liquid diet.  Taking antibiotic medicines by mouth.  Rest. More severe cases may need to be treated at a hospital. Treatment may include:  Not eating or drinking.  Taking prescription pain medicine.  Receiving antibiotic medicines through an IV tube.  Receiving fluids and nutrition through an IV tube.  Surgery. When your condition is under control, your health care provider may recommend that you have a colonoscopy. This is an exam to look at the entire large intestine. During the exam, a lubricated, bendable tube is inserted into the anus and then passed into the rectum, colon, and other parts of the large intestine. A colonoscopy can show how severe your diverticula are and whether something else may be causing your symptoms. Follow these instructions at home: Medicines  Take over-the-counter and prescription medicines only as told by your health care provider. These include fiber supplements, probiotics, and stool softeners.  If you were prescribed an antibiotic medicine, take it as told by your health care provider. Do not stop taking the antibiotic even if you start to feel better.  Do not drive or use heavy machinery while taking prescription pain medicine. General instructions   Follow a full liquid diet or another diet as directed by your health care provider. After your symptoms improve, your health care provider may  tell you to change your diet. He or she may recommend that you eat a diet that contains at least 25 g (25 grams) of fiber daily. Fiber makes it easier to pass stool. Healthy sources of fiber include: ? Berries. One cup contains 4-8 grams of  fiber. ? Beans or lentils. One half cup contains 5-8 grams of fiber. ? Green vegetables. One cup contains 4 grams of fiber.  Exercise for at least 30 minutes, 3 times each week. You should exercise hard enough to raise your heart rate and break a sweat.  Keep all follow-up visits as told by your health care provider. This is important. You may need a colonoscopy. Contact a health care provider if:  Your pain does not improve.  You have a hard time drinking or eating food.  Your bowel movements do not return to normal. Get help right away if:  Your pain gets worse.  Your symptoms do not get better with treatment.  Your symptoms suddenly get worse.  You have a fever.  You vomit more than one time.  You have stools that are bloody, black, or tarry. Summary  Diverticulitis is infection or inflammation of small pouches (diverticula) in the colon that form due to a condition called diverticulosis. Diverticula can trap stool (feces) and bacteria, causing infection and inflammation.  You are at higher risk for this condition if you have diverticulosis and you eat a diet that does not include enough fiber.  Most cases of this condition are mild and can be treated at home. More severe cases may need to be treated at a hospital.  When your condition is under control, your health care provider may recommend that you have an exam called a colonoscopy. This exam can show how severe your diverticula are and whether something else may be causing your symptoms. This information is not intended to replace advice given to you by your health care provider. Make sure you discuss any questions you have with your health care provider. Document Revised: 11/25/2017 Document Reviewed: 01/15/2017 Elsevier Patient Education  2020 [employer].

## 2020-05-09 MED ORDER — DIAZEPAM 5 MG PO TABS: ORAL_TABLET | ORAL | 0 refills | Status: DC

## 2020-05-09 NOTE — Telephone Encounter (Signed)
Please notify patient that I will prescribe her some diazepam to help her relax during her MRI. She will need a driver as this will make her drowsy.

## 2020-05-09 NOTE — Telephone Encounter (Signed)
Spoken and notified patient of Kate Clark's comments. Patient verbalized understanding.  

## 2020-05-09 NOTE — Telephone Encounter (Signed)
Spoken to patient and she stated that she cannot remember what was given to her before.

## 2020-05-12 ENCOUNTER — Ambulatory Visit (INDEPENDENT_AMBULATORY_CARE_PROVIDER_SITE_OTHER): Payer: Medicare Other

## 2020-05-12 ENCOUNTER — Other Ambulatory Visit: Payer: Self-pay

## 2020-05-12 ENCOUNTER — Other Ambulatory Visit (INDEPENDENT_AMBULATORY_CARE_PROVIDER_SITE_OTHER): Payer: Medicare Other

## 2020-05-12 VITALS — BP 111/64 | Wt 155.0 lb

## 2020-05-12 DIAGNOSIS — Z0000 Encounter for general adult medical examination without abnormal findings: Secondary | ICD-10-CM

## 2020-05-12 DIAGNOSIS — E119 Type 2 diabetes mellitus without complications: Secondary | ICD-10-CM

## 2020-05-12 DIAGNOSIS — E785 Hyperlipidemia, unspecified: Secondary | ICD-10-CM

## 2020-05-12 DIAGNOSIS — I10 Essential (primary) hypertension: Secondary | ICD-10-CM | POA: Diagnosis not present

## 2020-05-12 LAB — COMPREHENSIVE METABOLIC PANEL
ALT: 19 U/L (ref 0–35)
AST: 17 U/L (ref 0–37)
Albumin: 4.1 g/dL (ref 3.5–5.2)
Alkaline Phosphatase: 72 U/L (ref 39–117)
BUN: 15 mg/dL (ref 6–23)
CO2: 24 mEq/L (ref 19–32)
Calcium: 9.3 mg/dL (ref 8.4–10.5)
Chloride: 106 mEq/L (ref 96–112)
Creatinine, Ser: 0.96 mg/dL (ref 0.40–1.20)
GFR: 57.46 mL/min — ABNORMAL LOW (ref >60.00)
Glucose, Bld: 144 mg/dL — ABNORMAL HIGH (ref 70–99)
Potassium: 4.3 mEq/L (ref 3.5–5.1)
Sodium: 138 mEq/L (ref 135–145)
Total Bilirubin: 0.7 mg/dL (ref 0.2–1.2)
Total Protein: 6.6 g/dL (ref 6.0–8.3)

## 2020-05-12 LAB — LIPID PANEL
Cholesterol: 159 mg/dL (ref 0–200)
HDL: 44.1 mg/dL (ref >39.00)
NonHDL: 114.99
Total CHOL/HDL Ratio: 4
Triglycerides: 205 mg/dL — ABNORMAL HIGH (ref 0.0–149.0)
VLDL: 41 mg/dL — ABNORMAL HIGH (ref 0.0–40.0)

## 2020-05-12 LAB — HEMOGLOBIN A1C: Hgb A1c MFr Bld: 7.5 % — ABNORMAL HIGH (ref 4.6–6.5)

## 2020-05-12 LAB — LDL CHOLESTEROL, DIRECT: Direct LDL: 82 mg/dL

## 2020-05-12 NOTE — Progress Notes (Signed)
Subjective:   Brenda Contreras is a 69 y.o. female who presents for Medicare Annual (Subsequent) preventive examination.  Review of Systems: N/A   I connected with the patient today by telephone and verified that I am speaking with the correct person using two identifiers. Location patient: home Location nurse: work Persons participating in the virtual visit: patient, Marine scientist.   I discussed the limitations, risks, security and privacy concerns of performing an evaluation and management service by telephone and the availability of in person appointments. I also discussed with the patient that there may be a patient responsible charge related to this service. The patient expressed understanding and verbally consented to this telephonic visit.    Interactive audio and video telecommunications were attempted between this nurse and patient, however failed, due to patient having technical difficulties OR patient did not have access to video capability.  We continued and completed visit with audio only.     Cardiac Risk Factors include: advanced age (>18mn, >>76women);diabetes mellitus;hypertension;dyslipidemia     Objective:     Vitals: BP 111/64   Wt 155 lb (70.3 kg)   BMI 27.90 kg/m   Body mass index is 27.9 kg/m.  Advanced Directives 05/12/2020 05/09/2019 05/04/2018 01/28/2018  Does Patient Have a Medical Advance Directive? Yes Yes No No  Type of AParamedicof ANeponsetLiving will [place] will - -  Copy of [place] Chart? No - copy requested No - copy requested - -  Would patient like information on creating a medical advance directive? - - Yes (MAU/Ambulatory/Procedural Areas - Information given) Yes (ED - Information included in AVS)    Tobacco Social History   Tobacco Use  Smoking Status Never Smoker  Smokeless Tobacco Never Used     Counseling given: Not Answered   Clinical Intake:  Pre-visit  preparation completed: Yes  Pain : No/denies pain Pain Score: 0-No pain     Nutritional Risks: None Diabetes: Yes CBG done?: No Did pt. bring in CBG monitor from home?: No  How often do you need to have someone help you when you read instructions, pamphlets, or other written materials from your doctor or pharmacy?: 1 - Never What is the last grade level you completed in school?: 12th  Interpreter Needed?: No  Information entered by :: CJohnson, LPN  Past Medical History:  Diagnosis Date  . Diabetes mellitus   . 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 Dizziness   . GERD (gastroesophageal reflux disease)   . High cholesterol   . Hypertension   . Vaginal dryness    Past Surgical History:  Procedure Laterality Date  . APPENDECTOMY    . BREAST BIOPSY    . TUBAL LIGATION     Family History  Problem Relation Age of Onset  . Diabetes Father   . Hypertension Father   . Hyperlipidemia Father   . Stroke Father   . Diabetes Sister   . Hyperlipidemia Sister   . Hypertension Sister   . Lung cancer Paternal Uncle   . Diabetes Paternal Grandmother   . Heart disease Paternal Grandfather    Social History   Socioeconomic History  . Marital status: Widowed    Spouse name: Not on file  . Number of children: Not on file  . Years of education: Not on file  . Highest education level: Not on file  Occupational History  . Not on file  Tobacco Use  .  Smoking status: Never Smoker  . Smokeless tobacco: Never Used  Substance and Sexual Activity  . Alcohol use: No  . Drug use: No  . Sexual activity: Not Currently  Other Topics Concern  . 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      Has two children and two grandchildren.      Does not have a living will.  Desires CPR, would not want prolonged life support if futile.   Social Determinants of Health   Financial Resource Strain: Low Risk   . Difficulty of Paying Living Expenses:  Not hard at all  Food Insecurity: No Food Insecurity  . Worried About Charity fundraiser in the Last Year: Never true  . Ran Out of Food in the Last Year: Never true  Transportation Needs: No Transportation Needs  . Lack of Transportation (Medical): No  . Lack of Transportation (Non-Medical): No  Physical Activity: Insufficiently Active  . Days of Exercise per Week: 3 days  . Minutes of Exercise per Session: 30 min  Stress: No Stress Concern Present  . Feeling of Stress : Not at all  Social Connections:   . Frequency of Communication with Friends and Family:   . Frequency of Social Gatherings with Friends and Family:   . Attends Religious Services:   . Active Member of Clubs or Organizations:   . Attends Archivist Meetings:   Marland Kitchen Marital Status:     Outpatient Encounter Medications as of 05/12/2020  Medication Sig  . acetaminophen (TYLENOL) 500 MG tablet Take 1,000 mg by mouth every 6 (six) hours as needed for mild pain.  Marland Kitchen amLODipine (NORVASC) 10 MG tablet Take 1 tablet (10 mg total) by mouth daily. For blood pressure.  Marland Kitchen aspirin EC 81 MG tablet Take 81 mg by mouth daily.  . Blood Glucose Monitoring Suppl (ONE TOUCH ULTRA 2) w/Device KIT Use as instructed to test blood sugar daily. Dx is E11.9  . carvedilol (COREG) 6.25 MG tablet Take 1 tablet (6.25 mg total) by mouth at bedtime. For blood pressure.  . dapagliflozin propanediol (FARXIGA) 5 MG TABS tablet Take 5 mg by mouth daily. For diabetes.  . diazepam (VALIUM) 5 MG tablet Take 1/2 or 1 tablet 30-60 minutes prior to MRI for anxiety.  Marland Kitchen glucose blood (ONE TOUCH ULTRA TEST) test strip OneTouch Use as instructed to test blood sugar daily. Dx is E11.9  . losartan (COZAAR) 100 MG tablet Take 1 tablet (100 mg total) by mouth daily.  . ONE TOUCH LANCETS MISC Use as instructed to test blood sugar daily. Dx is E11.9  . pantoprazole (PROTONIX) 20 MG tablet Take 1 tablet (20 mg total) by mouth daily.  . sitaGLIPtin-metformin  (JANUMET) 50-1000 MG tablet Take 1 tablet by mouth 2 (two) times daily with a meal. For diabetes.  . ciprofloxacin (CIPRO) 500 MG tablet Take 1 tablet (500 mg total) by mouth 2 (two) times daily. One po bid x 7 days (Patient not taking: Reported on 05/12/2020)  . metroNIDAZOLE (FLAGYL) 500 MG tablet Take 1 tablet (500 mg total) by mouth 2 (two) times daily. One po bid x 7 days (Patient not taking: Reported on 05/12/2020)  . rosuvastatin (CRESTOR) 20 MG tablet Take 1 tablet (20 mg total) by mouth at bedtime.   No facility-administered encounter medications on file as of 05/12/2020.    Activities of Daily Living In your present state of health, do you have any difficulty  performing the following activities: 05/12/2020  Hearing? N  Vision? N  Difficulty concentrating or making decisions? N  Walking or climbing stairs? N  Dressing or bathing? N  Doing errands, shopping? N  Preparing Food and eating ? N  Using the Toilet? N  In the past six months, have you accidently leaked urine? N  Do you have problems with loss of bowel control? N  Managing your Medications? N  Managing your Finances? N  Housekeeping or managing your Housekeeping? N  Some recent data might be hidden    Patient Care Team: Pleas Koch, NP as PCP - General (Internal Medicine) Adrian Prows, MD as Consulting Physician (Cardiology) Marylynn Pearson, MD as Consulting Physician (Obstetrics and Gynecology) Warden Fillers, MD as Consulting Physician (Ophthalmology)    Assessment:   This is a routine wellness examination for Rosella.  Exercise Activities and Dietary recommendations Current Exercise Habits: Home exercise routine, Type of exercise: walking, Time (Minutes): 30, Frequency (Times/Week): 3, Weekly Exercise (Minutes/Week): 90, Intensity: Moderate, Exercise limited by: None identified  Goals    . Patient Stated     Starting 05/09/2019, I will continue to take medications as prescribed.     . Patient Stated       05/12/2020, I will continue to walk 3 days a week for 30 minutes.        Fall Risk Fall Risk  05/12/2020 05/09/2019 05/04/2018 04/25/2017 04/20/2016  Falls in the past year? 0 0 No No No  Number falls in past yr: 0 - - - -  Injury with Fall? 0 - - - -  Risk for fall due to : Medication side effect - - - -  Follow up Falls evaluation completed;Falls prevention discussed - - - -   Is the patient's home free of loose throw rugs in walkways, pet beds, electrical cords, etc?   yes      Grab bars in the bathroom? no      Handrails on the stairs?   yes      Adequate lighting?   yes  Timed Get Up and Go performed: N/A  Depression Screen PHQ 2/9 Scores 05/12/2020 05/11/2019 05/09/2019 05/04/2018  PHQ - 2 Score 0 0 3 0  PHQ- 9 Score 0 0 11 0     Cognitive Function MMSE - Mini Mental State Exam 05/12/2020 05/09/2019 05/04/2018  Orientation to time '5 5 5  ' Orientation to Place '5 5 5  ' Registration '3 3 3  ' Attention/ Calculation 5 0 0  Recall '3 3 3  ' Language- name 2 objects - 0 0  Language- repeat '1 1 1  ' Language- follow 3 step command - 0 3  Language- read & follow direction - 0 0  Write a sentence - 0 0  Copy design - 0 0  Total score - 17 20  Mini Cog  Mini-Cog screen was completed. Maximum score is 22. A value of 0 denotes this part of the MMSE was not completed or the patient failed this part of the Mini-Cog screening.       Immunization History  Administered Date(s) Administered  . Fluad Quad(high Dose 65+) 09/18/2019  . Influenza,inj,Quad PF,6+ Mos 09/30/2016, 09/15/2017, 02/08/2019  . Influenza-Unspecified 07/27/2013, 02/08/2019  . Pneumococcal Conjugate-13 04/20/2016  . Pneumococcal Polysaccharide-23 04/25/2017  . Pneumococcal-Unspecified 07/27/2013  . Tdap 04/26/2016  . Zoster 07/12/2016    Qualifies for Shingles Vaccine: Yes  Screening Tests Health Maintenance  Topic Date Due  . FOOT EXAM  02/27/2020  .  MAMMOGRAM  04/27/2020  . COVID-19 Vaccine (1) 05/24/2020  (Originally 05/18/1967)  . HEMOGLOBIN A1C  05/28/2020  . OPHTHALMOLOGY EXAM  06/04/2020  . INFLUENZA VACCINE  07/27/2020  . TETANUS/TDAP  04/26/2026  . COLONOSCOPY  06/12/2028  . DEXA SCAN  Completed  . Hepatitis C Screening  Completed  . PNA vac Low Risk Adult  Completed    Cancer Screenings: Lung: Low Dose CT Chest recommended if Age 57-80 years, 30 pack-year currently smoking OR have quit w/in 15 years. Patient does not qualify. Breast: Up to date on Mammogram: scheduled 07/14/2020 per patient   Bone Density/Dexa: completed 01/05/2017  Colorectal: completed 06/12/2018  Additional Screenings:  Hepatitis C Screening: 04/20/2016     Plan:   Patient will continue to walk 3 days a week for 30 minutes.    I have personally reviewed and noted the following in the patient's chart:   . Medical and social history . Use of alcohol, tobacco or illicit drugs  . Current medications and supplements . Functional ability and status . Nutritional status . Physical activity . Advanced directives . List of other physicians . Hospitalizations, surgeries, and ER visits in previous 12 months . Vitals . Screenings to include cognitive, depression, and falls . Referrals and appointments  In addition, I have reviewed and discussed with patient certain preventive protocols, quality metrics, and best practice recommendations. A written personalized care plan for preventive services as well as general preventive health recommendations were provided to patient.     Andrez Grime, LPN  [id number]

## 2020-05-12 NOTE — Progress Notes (Signed)
PCP notes:  Health Maintenance: Mammogram- scheduled for 07/14/2020 per patient   Abnormal Screenings: none   Patient concerns: none   Nurse concerns: none   Next PCP appt: 05/15/2020 @ 8 am

## 2020-05-12 NOTE — Patient Instructions (Signed)
Ms. Brenda Contreras , Thank you for taking time to come for your Medicare Wellness Visit. I appreciate your ongoing commitment to your health goals. Please review the following plan we discussed and let me know if I can assist you in the future.   Screening recommendations/referrals: Colonoscopy: Up to date, completed 06/12/2018 Mammogram: scheduled 07/14/2020 Bone Density: completed 01/05/2017 Recommended yearly ophthalmology/optometry visit for glaucoma screening and checkup Recommended yearly dental visit for hygiene and checkup  Vaccinations: Influenza vaccine: Up to date, completed 09/18/2019 Pneumococcal vaccine: Completed series Tdap vaccine: Up to date, completed 04/26/2016 Shingles vaccine: discussed    Advanced directives: Please bring a copy of your POA (Power of [place]) and/or Living Will to your next appointment.   Conditions/risks identified: diabetes, hypertension, hyperlipidemia  Next appointment: 05/15/2020 @ 8 am    Preventive Care 65 Years and Older, Female Preventive care refers to lifestyle choices and visits with your health care provider that can promote health and wellness. What does preventive care include?  A yearly physical exam. This is also called an annual well check.  Dental exams once or twice a year.  Routine eye exams. Ask your health care provider how often you should have your eyes checked.  Personal lifestyle choices, including:  Daily care of your teeth and gums.  Regular physical activity.  Eating a healthy diet.  Avoiding tobacco and drug use.  Limiting alcohol use.  Practicing safe sex.  Taking low-dose aspirin every day.  Taking vitamin and mineral supplements as recommended by your health care provider. What happens during an annual well check? The services and screenings done by your health care provider during your annual well check will depend on your age, overall health, lifestyle risk factors, and family history of  disease. Counseling  Your health care provider may ask you questions about your:  Alcohol use.  Tobacco use.  Drug use.  Emotional well-being.  Home and relationship well-being.  Sexual activity.  Eating habits.  History of falls.  Memory and ability to understand (cognition).  Work and work Statistician.  Reproductive health. Screening  You may have the following tests or measurements:  Height, weight, and BMI.  Blood pressure.  Lipid and cholesterol levels. These may be checked every 5 years, or more frequently if you are over 54 years old.  Skin check.  Lung cancer screening. You may have this screening every year starting at age 61 if you have a 30-pack-year history of smoking and currently smoke or have quit within the past 15 years.  Fecal occult blood test (FOBT) of the stool. You may have this test every year starting at age 74.  Flexible sigmoidoscopy or colonoscopy. You may have a sigmoidoscopy every 5 years or a colonoscopy every 10 years starting at age 42.  Hepatitis C blood test.  Hepatitis B blood test.  Sexually transmitted disease (STD) testing.  Diabetes screening. This is done by checking your blood sugar (glucose) after you have not eaten for a while (fasting). You may have this done every 1-3 years.  Bone density scan. This is done to screen for osteoporosis. You may have this done starting at age 80.  Mammogram. This may be done every 1-2 years. Talk to your health care provider about how often you should have regular mammograms. Talk with your health care provider about your test results, treatment options, and if necessary, the need for more tests. Vaccines  Your health care provider may recommend certain vaccines, such as:  Influenza vaccine. This is  recommended every year.  Tetanus, diphtheria, and acellular pertussis (Tdap, Td) vaccine. You may need a Td booster every 10 years.  Zoster vaccine. You may need this after age  76.  Pneumococcal 13-valent conjugate (PCV13) vaccine. One dose is recommended after age 2.  Pneumococcal polysaccharide (PPSV23) vaccine. One dose is recommended after age 69. Talk to your health care provider about which screenings and vaccines you need and how often you need them. This information is not intended to replace advice given to you by your health care provider. Make sure you discuss any questions you have with your health care provider. Document Released: 01/09/2016 Document Revised: 09/01/2016 Document Reviewed: 10/14/2015 Elsevier Interactive Patient Education  2017 [place] Prevention in the Home Falls can cause injuries. They can happen to people of all ages. There are many things you can do to make your home safe and to help prevent falls. What can I do on the outside of my home?  Regularly fix the edges of walkways and driveways and fix any cracks.  Remove anything that might make you trip as you walk through a door, such as a raised step or threshold.  Trim any bushes or trees on the path to your home.  Use bright outdoor lighting.  Clear any walking paths of anything that might make someone trip, such as rocks or tools.  Regularly check to see if handrails are loose or broken. Make sure that both sides of any steps have handrails.  Any raised decks and porches should have guardrails on the edges.  Have any leaves, snow, or ice cleared regularly.  Use sand or salt on walking paths during winter.  Clean up any spills in your garage right away. This includes oil or grease spills. What can I do in the bathroom?  Use night lights.  Install grab bars by the toilet and in the tub and shower. Do not use towel bars as grab bars.  Use non-skid mats or decals in the tub or shower.  If you need to sit down in the shower, use a plastic, non-slip stool.  Keep the floor dry. Clean up any water that spills on the floor as soon as it happens.  Remove  soap buildup in the tub or shower regularly.  Attach bath mats securely with double-sided non-slip rug tape.  Do not have throw rugs and other things on the floor that can make you trip. What can I do in the bedroom?  Use night lights.  Make sure that you have a light by your bed that is easy to reach.  Do not use any sheets or blankets that are too big for your bed. They should not hang down onto the floor.  Have a firm chair that has side arms. You can use this for support while you get dressed.  Do not have throw rugs and other things on the floor that can make you trip. What can I do in the kitchen?  Clean up any spills right away.  Avoid walking on wet floors.  Keep items that you use a lot in easy-to-reach places.  If you need to reach something above you, use a strong step stool that has a grab bar.  Keep electrical cords out of the way.  Do not use floor polish or wax that makes floors slippery. If you must use wax, use non-skid floor wax.  Do not have throw rugs and other things on the floor that can make you trip.  What can I do with my stairs?  Do not leave any items on the stairs.  Make sure that there are handrails on both sides of the stairs and use them. Fix handrails that are broken or loose. Make sure that handrails are as long as the stairways.  Check any carpeting to make sure that it is firmly attached to the stairs. Fix any carpet that is loose or worn.  Avoid having throw rugs at the top or bottom of the stairs. If you do have throw rugs, attach them to the floor with carpet tape.  Make sure that you have a light switch at the top of the stairs and the bottom of the stairs. If you do not have them, ask someone to add them for you. What else can I do to help prevent falls?  Wear shoes that:  Do not have high heels.  Have rubber bottoms.  Are comfortable and fit you well.  Are closed at the toe. Do not wear sandals.  If you use a  stepladder:  Make sure that it is fully opened. Do not climb a closed stepladder.  Make sure that both sides of the stepladder are locked into place.  Ask someone to hold it for you, if possible.  Clearly mark and make sure that you can see:  Any grab bars or handrails.  First and last steps.  Where the edge of each step is.  Use tools that help you move around (mobility aids) if they are needed. These include:  Canes.  Walkers.  Scooters.  Crutches.  Turn on the lights when you go into a dark area. Replace any light bulbs as soon as they burn out.  Set up your furniture so you have a clear path. Avoid moving your furniture around.  If any of your floors are uneven, fix them.  If there are any pets around you, be aware of where they are.  Review your medicines with your doctor. Some medicines can make you feel dizzy. This can increase your chance of falling. Ask your doctor what other things that you can do to help prevent falls. This information is not intended to replace advice given to you by your health care provider. Make sure you discuss any questions you have with your health care provider. Document Released: 10/09/2009 Document Revised: 05/20/2016 Document Reviewed: 01/17/2015 Elsevier Interactive Patient Education  2017 [employer].

## 2020-05-15 ENCOUNTER — Other Ambulatory Visit: Payer: Self-pay

## 2020-05-15 ENCOUNTER — Encounter: Payer: Self-pay | Admitting: Primary Care

## 2020-05-15 ENCOUNTER — Ambulatory Visit (INDEPENDENT_AMBULATORY_CARE_PROVIDER_SITE_OTHER): Payer: Medicare Other | Admitting: Primary Care

## 2020-05-15 VITALS — BP 136/82 | HR 85 | Temp 95.8°F | Ht 62.5 in | Wt 158.8 lb

## 2020-05-15 DIAGNOSIS — I10 Essential (primary) hypertension: Secondary | ICD-10-CM | POA: Diagnosis not present

## 2020-05-15 DIAGNOSIS — K219 Gastro-esophageal reflux disease without esophagitis: Secondary | ICD-10-CM

## 2020-05-15 DIAGNOSIS — E119 Type 2 diabetes mellitus without complications: Secondary | ICD-10-CM | POA: Diagnosis not present

## 2020-05-15 DIAGNOSIS — Z0000 Encounter for general adult medical examination without abnormal findings: Secondary | ICD-10-CM

## 2020-05-15 DIAGNOSIS — E785 Hyperlipidemia, unspecified: Secondary | ICD-10-CM

## 2020-05-15 DIAGNOSIS — N281 Cyst of kidney, acquired: Secondary | ICD-10-CM

## 2020-05-15 DIAGNOSIS — Z0001 Encounter for general adult medical examination with abnormal findings: Secondary | ICD-10-CM | POA: Insufficient documentation

## 2020-05-15 DIAGNOSIS — Z23 Encounter for immunization: Secondary | ICD-10-CM

## 2020-05-15 MED ORDER — FARXIGA 5 MG PO TABS: 5.0000 mg | ORAL_TABLET | Freq: Every day | ORAL | 3 refills | Status: DC

## 2020-05-15 MED ORDER — SITAGLIPTIN PHOS-METFORMIN HCL 50-1000 MG PO TABS: 1.0000 | ORAL_TABLET | Freq: Two times a day (BID) | ORAL | 3 refills | Status: DC

## 2020-05-15 MED ORDER — LOSARTAN POTASSIUM 100 MG PO TABS: 100.0000 mg | ORAL_TABLET | Freq: Every day | ORAL | 3 refills | Status: DC

## 2020-05-15 MED ORDER — ZOSTER VAC RECOMB ADJUVANTED 50 MCG/0.5ML IM SUSR: 0.5000 mL | Freq: Once | INTRAMUSCULAR | 1 refills | Status: AC

## 2020-05-15 MED ORDER — AMLODIPINE BESYLATE 10 MG PO TABS: 10.0000 mg | ORAL_TABLET | Freq: Every day | ORAL | 1 refills | Status: DC

## 2020-05-15 MED ORDER — CARVEDILOL 6.25 MG PO TABS: 6.2500 mg | ORAL_TABLET | Freq: Two times a day (BID) | ORAL | 3 refills | Status: DC

## 2020-05-15 MED ORDER — FAMOTIDINE 20 MG PO TABS: 20.0000 mg | ORAL_TABLET | Freq: Two times a day (BID) | ORAL | 0 refills | Status: DC

## 2020-05-15 MED ORDER — ROSUVASTATIN CALCIUM 20 MG PO TABS: 20.0000 mg | ORAL_TABLET | Freq: Every day | ORAL | 3 refills | Status: DC

## 2020-05-15 NOTE — Progress Notes (Signed)
Subjective:    Patient ID: ARIBELLA VAVRA, female    DOB: May 05, 1951, 69 y.o.   MRN: [id number]  HPI  This visit occurred during the SARS-CoV-2 public health emergency.  Safety protocols were in place, including screening questions prior to the visit, additional usage of staff PPE, and extensive cleaning of exam room while observing appropriate contact time as indicated for disinfecting solutions.   Ms. Sweeny is a 69 year old female who presents today for complete physical.  Immunizations: -Tetanus: Completed in 2017 -Influenza: Completed last season -Shingles: Completed Zostavax -Pneumonia: Completed Prevnar and Pneumovax, last dose in 2018 -Covid-19: Undecided.   Diet: She endorses a fair diet.  Exercise: She is walking three times weekly.  Eye exam: UTD Dental exam: No recent exam  Mammogram: Due in July 2021 Dexa: Follows with GYN Colonoscopy: Completed in 2019, due in 2026 Hep C Screen: Negative  BP Readings from Last 3 Encounters:  05/15/20 136/82  05/12/20 111/64  05/08/20 (!) 146/80     Review of Systems  Constitutional: Negative for unexpected weight change.  HENT: Negative for rhinorrhea.   Respiratory: Negative for cough and shortness of breath.   Cardiovascular: Negative for chest pain.  Gastrointestinal: Negative for constipation and diarrhea.  Genitourinary: Negative for difficulty urinating.  Musculoskeletal: Negative for arthralgias.  Skin: Negative for rash.  Allergic/Immunologic: Positive for environmental allergies.  Neurological: Negative for dizziness, numbness and headaches.  Psychiatric/Behavioral: The patient is not nervous/anxious.        Past Medical History:  Diagnosis Date  . Diabetes mellitus   . 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 Dizziness   . GERD (gastroesophageal reflux disease)   . High cholesterol   . Hypertension   . Vaginal dryness      Social History   Socioeconomic History  . Marital status:  Widowed    Spouse name: Not on file  . Number of children: Not on file  . Years of education: Not on file  . Highest education level: Not on file  Occupational History  . Not on file  Tobacco Use  . Smoking status: Never Smoker  . Smokeless tobacco: Never Used  Substance and Sexual Activity  . Alcohol use: No  . Drug use: No  . Sexual activity: Not Currently  Other Topics Concern  . 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      Has two children and two grandchildren.      Does not have a living will.  Desires CPR, would not want prolonged life support if futile.   Social Determinants of Health   Financial Resource Strain: Low Risk   . Difficulty of Paying Living Expenses: Not hard at all  Food Insecurity: No Food Insecurity  . Worried About Charity fundraiser in the Last Year: Never true  . Ran Out of Food in the Last Year: Never true  Transportation Needs: No Transportation Needs  . Lack of Transportation (Medical): No  . Lack of Transportation (Non-Medical): No  Physical Activity: Insufficiently Active  . Days of Exercise per Week: 3 days  . Minutes of Exercise per Session: 30 min  Stress: No Stress Concern Present  . Feeling of Stress : Not at all  Social Connections:   . Frequency of Communication with Friends and Family:   . Frequency of Social Gatherings with Friends and Family:   . Attends Religious Services:   .  Active Member of Clubs or Organizations:   . Attends Archivist Meetings:   Marland Kitchen Marital Status:   Intimate Partner Violence: Not At Risk  . Fear of Current or Ex-Partner: No  . Emotionally Abused: No  . Physically Abused: No  . Sexually Abused: No    Past Surgical History:  Procedure Laterality Date  . APPENDECTOMY    . BREAST BIOPSY    . TUBAL LIGATION      Family History  Problem Relation Age of Onset  . Diabetes Father   . Hypertension Father   . Hyperlipidemia Father   . Stroke Father   .  Diabetes Sister   . Hyperlipidemia Sister   . Hypertension Sister   . Lung cancer Paternal Uncle   . Diabetes Paternal Grandmother   . Heart disease Paternal Grandfather     No Known Allergies  Current Outpatient Medications on File Prior to Visit  Medication Sig Dispense Refill  . acetaminophen (TYLENOL) 500 MG tablet Take 1,000 mg by mouth every 6 (six) hours as needed for mild pain.    Marland Kitchen aspirin EC 81 MG tablet Take 81 mg by mouth daily.    . Blood Glucose Monitoring Suppl (ONE TOUCH ULTRA 2) w/Device KIT Use as instructed to test blood sugar daily. Dx is E11.9 1 each 0  . diazepam (VALIUM) 5 MG tablet Take 1/2 or 1 tablet 30-60 minutes prior to MRI for anxiety. 2 tablet 0  . glucose blood (ONE TOUCH ULTRA TEST) test strip OneTouch Use as instructed to test blood sugar daily. Dx is E11.9 100 each 5  . ONE TOUCH LANCETS MISC Use as instructed to test blood sugar daily. Dx is E11.9 200 each 2  . pantoprazole (PROTONIX) 20 MG tablet Take 1 tablet (20 mg total) by mouth daily. 90 tablet 2   No current facility-administered medications on file prior to visit.    BP 136/82   Pulse 85   Temp (!) 95.8 F (35.4 C) (Temporal)   Ht 5' 2.5" (1.588 m)   Wt 158 lb 12 oz (72 kg)   SpO2 98%   BMI 28.57 kg/m    Objective:   Physical Exam  Constitutional: She is oriented to person, place, and time. She appears well-nourished.  HENT:  Right Ear: Tympanic membrane and ear canal normal.  Left Ear: Tympanic membrane and ear canal normal.  Mouth/Throat: Oropharynx is clear and moist.  Eyes: Pupils are equal, round, and reactive to light. EOM are normal.  Cardiovascular: Normal rate and regular rhythm.  Respiratory: Effort normal and breath sounds normal.  GI: Soft. Bowel sounds are normal. There is no abdominal tenderness.  Musculoskeletal:        General: Normal range of motion.     Cervical back: Neck supple.  Neurological: She is alert and oriented to person, place, and time. No  cranial nerve deficit.  Reflex Scores:      Patellar reflexes are 2+ on the right side and 2+ on the left side. Skin: Skin is warm and dry.  Psychiatric: She has a normal mood and affect.           Assessment & Plan:

## 2020-05-15 NOTE — Assessment & Plan Note (Signed)
A1C of 7.5 which is overall controlled. Would like to see her <7.5 so I encouraged her to work on weight loss through diet and exercise.   Continue current regimen. Foot exam today. Eye exam UTD. Pneumonia vaccinations UTD. Managed on statin and ARB.  Follow up in 6 months.

## 2020-05-15 NOTE — Assessment & Plan Note (Signed)
Borderline high today, overall stable. For some reason she is prescribed carvedilol once daily rather than twice daily, will change Rx to correct dosing. Continue losartan and amlodipine.   CMP reviewed.

## 2020-05-15 NOTE — Assessment & Plan Note (Signed)
MR abdomen ordered and pending for June 2021.

## 2020-05-15 NOTE — Assessment & Plan Note (Signed)
LDL at goal on rosuvastatin. Continue same.

## 2020-05-15 NOTE — Assessment & Plan Note (Signed)
No symptoms of GERD. Will stop pantoprazole, switch to famotidine, then wean off. She will update.

## 2020-05-15 NOTE — Patient Instructions (Addendum)
Stop taking pantoprazole for heartburn. Start taking famotidine 20 mg once or twice daily for heartburn.   Take your carvedilol twice daily for blood pressure rather than once daily.  Take the shingles vaccine to your pharmacy for administration.   Have your gynecologist send me your mammogram and bone density reports this Summer.  Continue exercising. You should be getting 150 minutes of moderate intensity exercise weekly.  Continue to work on a healthy diet. Ensure you are consuming 64 ounces of water daily.  Please schedule a follow up appointment in 6 months for diabetes check.   It was a pleasure to see you today!   Preventive Care 67 Years and Older, Female Preventive care refers to lifestyle choices and visits with your health care provider that can promote health and wellness. This includes:  A yearly physical exam. This is also called an annual well check.  Regular dental and eye exams.  Immunizations.  Screening for certain conditions.  Healthy lifestyle choices, such as diet and exercise. What can I expect for my preventive care visit? Physical exam Your health care provider will check:  Height and weight. These may be used to calculate body mass index (BMI), which is a measurement that tells if you are at a healthy weight.  Heart rate and blood pressure.  Your skin for abnormal spots. Counseling Your health care provider may ask you questions about:  Alcohol, tobacco, and drug use.  Emotional well-being.  Home and relationship well-being.  Sexual activity.  Eating habits.  History of falls.  Memory and ability to understand (cognition).  Work and work Statistician.  Pregnancy and menstrual history. What immunizations do I need?  Influenza (flu) vaccine  This is recommended every year. Tetanus, diphtheria, and pertussis (Tdap) vaccine  You may need a Td booster every 10 years. Varicella (chickenpox) vaccine  You may need this vaccine if  you have not already been vaccinated. Zoster (shingles) vaccine  You may need this after age 25. Pneumococcal conjugate (PCV13) vaccine  One dose is recommended after age 28. Pneumococcal polysaccharide (PPSV23) vaccine  One dose is recommended after age 59. Measles, mumps, and rubella (MMR) vaccine  You may need at least one dose of MMR if you were born in 1957 or later. You may also need a second dose. Meningococcal conjugate (MenACWY) vaccine  You may need this if you have certain conditions. Hepatitis A vaccine  You may need this if you have certain conditions or if you travel or work in places where you may be exposed to hepatitis A. Hepatitis B vaccine  You may need this if you have certain conditions or if you travel or work in places where you may be exposed to hepatitis B. Haemophilus influenzae type b (Hib) vaccine  You may need this if you have certain conditions. You may receive vaccines as individual doses or as more than one vaccine together in one shot (combination vaccines). Talk with your health care provider about the risks and benefits of combination vaccines. What tests do I need? Blood tests  Lipid and cholesterol levels. These may be checked every 5 years, or more frequently depending on your overall health.  Hepatitis C test.  Hepatitis B test. Screening  Lung cancer screening. You may have this screening every year starting at age 59 if you have a 30-pack-year history of smoking and currently smoke or have quit within the past 15 years.  Colorectal cancer screening. All adults should have this screening starting at age 83  and continuing until age 58. Your health care provider may recommend screening at age 100 if you are at increased risk. You will have tests every 1-10 years, depending on your results and the type of screening test.  Diabetes screening. This is done by checking your blood sugar (glucose) after you have not eaten for a while (fasting).  You may have this done every 1-3 years.  Mammogram. This may be done every 1-2 years. Talk with your health care provider about how often you should have regular mammograms.  BRCA-related cancer screening. This may be done if you have a family history of breast, ovarian, tubal, or peritoneal cancers. Other tests  Sexually transmitted disease (STD) testing.  Bone density scan. This is done to screen for osteoporosis. You may have this done starting at age 14. Follow these instructions at home: Eating and drinking  Eat a diet that includes fresh fruits and vegetables, whole grains, lean protein, and low-fat dairy products. Limit your intake of foods with high amounts of sugar, saturated fats, and salt.  Take vitamin and mineral supplements as recommended by your health care provider.  Do not drink alcohol if your health care provider tells you not to drink.  If you drink alcohol: ? Limit how much you have to 0-1 drink a day. ? Be aware of how much alcohol is in your drink. In the [place] one drink equals one 12 oz bottle of beer (355 mL), one 5 oz glass of wine (148 mL), or one 1 oz glass of hard liquor (44 mL). Lifestyle  Take daily care of your teeth and gums.  Stay active. Exercise for at least 30 minutes on 5 or more days each week.  Do not use any products that contain nicotine or tobacco, such as cigarettes, e-cigarettes, and chewing tobacco. If you need help quitting, ask your health care provider.  If you are sexually active, practice safe sex. Use a condom or other form of protection in order to prevent STIs (sexually transmitted infections).  Talk with your health care provider about taking a low-dose aspirin or statin. What's next?  Go to your health care provider once a year for a well check visit.  Ask your health care provider how often you should have your eyes and teeth checked.  Stay up to date on all vaccines. This information is not intended to replace advice  given to you by your health care provider. Make sure you discuss any questions you have with your health care provider. Document Revised: 12/07/2018 Document Reviewed: 12/07/2018 Elsevier Patient Education  2020 [employer].

## 2020-05-15 NOTE — Assessment & Plan Note (Signed)
Shingrix Rx provided, other immunizations UTD. Mammogram due in July 2021. Bone density scan UTD per patient. Will obtain those records. Colonoscopy UTD, due in 2026. Discussed the importance of a healthy diet and regular exercise in order for weight loss, and to reduce the risk of any potential medical problems.  Exam today unremarkable Labs reviewed

## 2020-05-28 ENCOUNTER — Telehealth: Payer: Self-pay | Admitting: Primary Care

## 2020-05-28 NOTE — Telephone Encounter (Signed)
Please kindly notify patient that she needs evaluation for flares and that I would like to see her if she believes that she's having an active flare.

## 2020-05-28 NOTE — Telephone Encounter (Signed)
Patient called.  Patient said she was up all night with Diverticulitis.  Patient said she's not hurting right now and it eased off at 3:15 am. Patient wants to know if something can be called in for when she has a flare up. Patient uses [facility].

## 2020-05-28 NOTE — Telephone Encounter (Signed)
Spoken and notified patient of Tawni Millers comments. Patient verbalized understanding. OV have been schedule for 05/30/2020.

## 2020-05-30 ENCOUNTER — Encounter: Payer: Self-pay | Admitting: Primary Care

## 2020-05-30 ENCOUNTER — Other Ambulatory Visit: Payer: Self-pay

## 2020-05-30 ENCOUNTER — Ambulatory Visit (INDEPENDENT_AMBULATORY_CARE_PROVIDER_SITE_OTHER): Payer: Medicare Other | Admitting: Primary Care

## 2020-05-30 VITALS — BP 130/80 | HR 88 | Temp 96.5°F | Ht 62.5 in | Wt 154.2 lb

## 2020-05-30 DIAGNOSIS — R1030 Lower abdominal pain, unspecified: Secondary | ICD-10-CM | POA: Diagnosis not present

## 2020-05-30 LAB — CBC WITH DIFFERENTIAL/PLATELET
Basophils Absolute: 0.1 10*3/uL (ref 0.0–0.1)
Basophils Relative: 1.2 % (ref 0.0–3.0)
Eosinophils Absolute: 0.3 10*3/uL (ref 0.0–0.7)
Eosinophils Relative: 3.8 % (ref 0.0–5.0)
HCT: 37.4 % (ref 36.0–46.0)
Hemoglobin: 12.2 g/dL (ref 12.0–15.0)
Lymphocytes Relative: 28.1 % (ref 12.0–46.0)
Lymphs Abs: 2.3 10*3/uL (ref 0.7–4.0)
MCHC: 32.5 g/dL (ref 30.0–36.0)
MCV: 86.8 fl (ref 78.0–100.0)
Monocytes Absolute: 0.8 10*3/uL (ref 0.1–1.0)
Monocytes Relative: 10.1 % (ref 3.0–12.0)
Neutro Abs: 4.7 10*3/uL (ref 1.4–7.7)
Neutrophils Relative %: 56.8 % (ref 43.0–77.0)
Platelets: 183 10*3/uL (ref 150.0–400.0)
RBC: 4.31 Mil/uL (ref 3.87–5.11)
RDW: 15.2 % (ref 11.5–15.5)
WBC: 8.2 10*3/uL (ref 4.0–10.5)

## 2020-05-30 LAB — COMPREHENSIVE METABOLIC PANEL
ALT: 19 U/L (ref 0–35)
AST: 15 U/L (ref 0–37)
Albumin: 4.5 g/dL (ref 3.5–5.2)
Alkaline Phosphatase: 83 U/L (ref 39–117)
BUN: 26 mg/dL — ABNORMAL HIGH (ref 6–23)
CO2: 24 mEq/L (ref 19–32)
Calcium: 10.5 mg/dL (ref 8.4–10.5)
Chloride: 101 mEq/L (ref 96–112)
Creatinine, Ser: 1.12 mg/dL (ref 0.40–1.20)
GFR: 48.09 mL/min — ABNORMAL LOW (ref >60.00)
Glucose, Bld: 167 mg/dL — ABNORMAL HIGH (ref 70–99)
Potassium: 4.5 mEq/L (ref 3.5–5.1)
Sodium: 133 mEq/L — ABNORMAL LOW (ref 135–145)
Total Bilirubin: 1.1 mg/dL (ref 0.2–1.2)
Total Protein: 7.3 g/dL (ref 6.0–8.3)

## 2020-05-30 NOTE — Assessment & Plan Note (Signed)
Acute for the last four days, improving with clear liquid diet and bland diet. Exam today with mild tenderness, no distress. She does not appear sickly.  Checking CBC with diff and CMP today. At this point she doesn't appear to need antibiotics, but we did discuss to advance her diet slowly and to notify me in a few days if symptoms return/progress.   Await labs.

## 2020-05-30 NOTE — Progress Notes (Signed)
Subjective:    Patient ID: Brenda Contreras, female    DOB: 1951/08/03, 69 y.o.   MRN: [id number]  HPI  This visit occurred during the SARS-CoV-2 public health emergency.  Safety protocols were in place, including screening questions prior to the visit, additional usage of staff PPE, and extensive cleaning of exam room while observing appropriate contact time as indicated for disinfecting solutions.   Brenda Contreras is a 69 year old female with a history of acute sigmoid diverticulitis, hyperlipidemia, hypertension, GERD, diabetes, CKD who presents today with a chief complaint of abdominal pain.  Her pain is located to the bilateral lower abdomen which began four days ago. She was "doubled over in pain" four evenings ago. Her pain has gradually improved throughout this week. She's also been on a clear liquid diet for the most part, has had scrambled eggs, potato soup, yogurt which she tolerated well. She's having daily twice daily bowel movements.  She denies fevers, bloody stools, nausea, vomiting, urinary symptoms.   She did take a dose of Tylenol initially when her pain began, no improvement at the time.   Review of Systems  Constitutional: Negative for fever.  Gastrointestinal: Positive for abdominal pain. Negative for blood in stool, constipation and vomiting.  Genitourinary: Negative for dysuria and frequency.       Past Medical History:  Diagnosis Date  . Acute diverticulitis 05/08/2020  . Diabetes mellitus   . 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 Dizziness   . GERD (gastroesophageal reflux disease)   . High cholesterol   . Hypertension   . Vaginal dryness      Social History   Socioeconomic History  . Marital status: Widowed    Spouse name: Not on file  . Number of children: Not on file  . Years of education: Not on file  . Highest education level: Not on file  Occupational History  . Not on file  Tobacco Use  . Smoking status: Never Smoker  . Smokeless  tobacco: Never Used  Substance and Sexual Activity  . Alcohol use: No  . Drug use: No  . Sexual activity: Not Currently  Other Topics Concern  . 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      Has two children and two grandchildren.      Does not have a living will.  Desires CPR, would not want prolonged life support if futile.   Social Determinants of Health   Financial Resource Strain: Low Risk   . Difficulty of Paying Living Expenses: Not hard at all  Food Insecurity: No Food Insecurity  . Worried About Charity fundraiser in the Last Year: Never true  . Ran Out of Food in the Last Year: Never true  Transportation Needs: No Transportation Needs  . Lack of Transportation (Medical): No  . Lack of Transportation (Non-Medical): No  Physical Activity: Insufficiently Active  . Days of Exercise per Week: 3 days  . Minutes of Exercise per Session: 30 min  Stress: No Stress Concern Present  . Feeling of Stress : Not at all  Social Connections:   . Frequency of Communication with Friends and Family:   . Frequency of Social Gatherings with Friends and Family:   . Attends Religious Services:   . Active Member of Clubs or Organizations:   . Attends Archivist Meetings:   Marland Kitchen Marital Status:   Intimate Partner Violence:  Not At Risk  . Fear of Current or Ex-Partner: No  . Emotionally Abused: No  . Physically Abused: No  . Sexually Abused: No    Past Surgical History:  Procedure Laterality Date  . APPENDECTOMY    . BREAST BIOPSY    . TUBAL LIGATION      Family History  Problem Relation Age of Onset  . Diabetes Father   . Hypertension Father   . Hyperlipidemia Father   . Stroke Father   . Diabetes Sister   . Hyperlipidemia Sister   . Hypertension Sister   . Lung cancer Paternal Uncle   . Diabetes Paternal Grandmother   . Heart disease Paternal Grandfather     No Known Allergies  Current Outpatient Medications on File Prior to  Visit  Medication Sig Dispense Refill  . acetaminophen (TYLENOL) 500 MG tablet Take 1,000 mg by mouth every 6 (six) hours as needed for mild pain.    Marland Kitchen amLODipine (NORVASC) 10 MG tablet Take 1 tablet (10 mg total) by mouth daily. For blood pressure. 90 tablet 1  . aspirin EC 81 MG tablet Take 81 mg by mouth daily.    . Blood Glucose Monitoring Suppl (ONE TOUCH ULTRA 2) w/Device KIT Use as instructed to test blood sugar daily. Dx is E11.9 1 each 0  . carvedilol (COREG) 6.25 MG tablet Take 1 tablet (6.25 mg total) by mouth 2 (two) times daily with a meal. For blood pressure. 180 tablet 3  . dapagliflozin propanediol (FARXIGA) 5 MG TABS tablet Take 5 mg by mouth daily. For diabetes. 90 tablet 3  . diazepam (VALIUM) 5 MG tablet Take 1/2 or 1 tablet 30-60 minutes prior to MRI for anxiety. 2 tablet 0  . famotidine (PEPCID) 20 MG tablet Take 1 tablet (20 mg total) by mouth 2 (two) times daily. For heartburn. 60 tablet 0  . glucose blood (ONE TOUCH ULTRA TEST) test strip OneTouch Use as instructed to test blood sugar daily. Dx is E11.9 100 each 5  . losartan (COZAAR) 100 MG tablet Take 1 tablet (100 mg total) by mouth daily. For blood pressure. 90 tablet 3  . ONE TOUCH LANCETS MISC Use as instructed to test blood sugar daily. Dx is E11.9 200 each 2  . rosuvastatin (CRESTOR) 20 MG tablet Take 1 tablet (20 mg total) by mouth at bedtime. For cholesterol. 90 tablet 3  . sitaGLIPtin-metformin (JANUMET) 50-1000 MG tablet Take 1 tablet by mouth 2 (two) times daily with a meal. For diabetes. 180 tablet 3   No current facility-administered medications on file prior to visit.    BP 130/80   Pulse 88   Temp (!) 96.5 F (35.8 C) (Temporal)   Ht 5' 2.5" (1.588 m)   Wt 154 lb 4 oz (70 kg)   SpO2 98%   BMI 27.76 kg/m    Objective:   Physical Exam  Constitutional: She appears well-nourished.  Respiratory: Effort normal.  GI: Soft. Normal appearance and bowel sounds are normal. There is abdominal tenderness  in the right lower quadrant, suprapubic area and left lower quadrant.    Skin: Skin is warm and dry.           Assessment & Plan:

## 2020-05-30 NOTE — Patient Instructions (Signed)
Stop by the lab prior to leaving today. I will notify you of your results once received.   Slowly advance your diet as discussed. Stay hydrated with water.  Please call or message me Monday if your symptoms progress, you run fevers, notice blood in the stools.   It was a pleasure to see you today!   Diverticulitis  Diverticulitis is infection or inflammation of small pouches (diverticula) in the colon that form due to a condition called diverticulosis. Diverticula can trap stool (feces) and bacteria, causing infection and inflammation. Diverticulitis may cause severe stomach pain and diarrhea. It may lead to tissue damage in the colon that causes bleeding. The diverticula may also burst (rupture) and cause infected stool to enter other areas of the abdomen. Complications of diverticulitis can include:  Bleeding.  Severe infection.  Severe pain.  Rupture (perforation) of the colon.  Blockage (obstruction) of the colon. What are the causes? This condition is caused by stool becoming trapped in the diverticula, which allows bacteria to grow in the diverticula. This leads to inflammation and infection. What increases the risk? You are more likely to develop this condition if:  You have diverticulosis. The risk for diverticulosis increases if: ? You are overweight or obese. ? You use tobacco products. ? You do not get enough exercise.  You eat a diet that does not include enough fiber. High-fiber foods include fruits, vegetables, beans, nuts, and whole grains. What are the signs or symptoms? Symptoms of this condition may include:  Pain and tenderness in the abdomen. The pain is normally located on the left side of the abdomen, but it may occur in other areas.  Fever and chills.  Bloating.  Cramping.  Nausea.  Vomiting.  Changes in bowel routines.  Blood in your stool. How is this diagnosed? This condition is diagnosed based on:  Your medical history.  A physical  exam.  Tests to make sure there is nothing else causing your condition. These tests may include: ? Blood tests. ? Urine tests. ? Imaging tests of the abdomen, including X-rays, ultrasounds, MRIs, or CT scans. How is this treated? Most cases of this condition are mild and can be treated at home. Treatment may include:  Taking over-the-counter pain medicines.  Following a clear liquid diet.  Taking antibiotic medicines by mouth.  Rest. More severe cases may need to be treated at a hospital. Treatment may include:  Not eating or drinking.  Taking prescription pain medicine.  Receiving antibiotic medicines through an IV tube.  Receiving fluids and nutrition through an IV tube.  Surgery. When your condition is under control, your health care provider may recommend that you have a colonoscopy. This is an exam to look at the entire large intestine. During the exam, a lubricated, bendable tube is inserted into the anus and then passed into the rectum, colon, and other parts of the large intestine. A colonoscopy can show how severe your diverticula are and whether something else may be causing your symptoms. Follow these instructions at home: Medicines  Take over-the-counter and prescription medicines only as told by your health care provider. These include fiber supplements, probiotics, and stool softeners.  If you were prescribed an antibiotic medicine, take it as told by your health care provider. Do not stop taking the antibiotic even if you start to feel better.  Do not drive or use heavy machinery while taking prescription pain medicine. General instructions   Follow a full liquid diet or another diet as directed by  your health care provider. After your symptoms improve, your health care provider may tell you to change your diet. He or she may recommend that you eat a diet that contains at least 25 g (25 grams) of fiber daily. Fiber makes it easier to pass stool. Healthy sources  of fiber include: ? Berries. One cup contains 4-8 grams of fiber. ? Beans or lentils. One half cup contains 5-8 grams of fiber. ? Green vegetables. One cup contains 4 grams of fiber.  Exercise for at least 30 minutes, 3 times each week. You should exercise hard enough to raise your heart rate and break a sweat.  Keep all follow-up visits as told by your health care provider. This is important. You may need a colonoscopy. Contact a health care provider if:  Your pain does not improve.  You have a hard time drinking or eating food.  Your bowel movements do not return to normal. Get help right away if:  Your pain gets worse.  Your symptoms do not get better with treatment.  Your symptoms suddenly get worse.  You have a fever.  You vomit more than one time.  You have stools that are bloody, black, or tarry. Summary  Diverticulitis is infection or inflammation of small pouches (diverticula) in the colon that form due to a condition called diverticulosis. Diverticula can trap stool (feces) and bacteria, causing infection and inflammation.  You are at higher risk for this condition if you have diverticulosis and you eat a diet that does not include enough fiber.  Most cases of this condition are mild and can be treated at home. More severe cases may need to be treated at a hospital.  When your condition is under control, your health care provider may recommend that you have an exam called a colonoscopy. This exam can show how severe your diverticula are and whether something else may be causing your symptoms. This information is not intended to replace advice given to you by your health care provider. Make sure you discuss any questions you have with your health care provider. Document Revised: 11/25/2017 Document Reviewed: 01/15/2017 Elsevier Patient Education  2020 [employer].

## 2020-06-02 DIAGNOSIS — E119 Type 2 diabetes mellitus without complications: Secondary | ICD-10-CM

## 2020-06-04 DIAGNOSIS — H5703 Miosis: Secondary | ICD-10-CM | POA: Diagnosis not present

## 2020-06-04 DIAGNOSIS — E119 Type 2 diabetes mellitus without complications: Secondary | ICD-10-CM | POA: Diagnosis not present

## 2020-06-04 DIAGNOSIS — H25813 Combined forms of age-related cataract, bilateral: Secondary | ICD-10-CM | POA: Diagnosis not present

## 2020-06-04 LAB — HM DIABETES EYE EXAM

## 2020-06-04 MED ORDER — GLUCOSE BLOOD VI STRP: ORAL_STRIP | 5 refills | Status: DC

## 2020-06-06 ENCOUNTER — Encounter: Payer: Self-pay | Admitting: Primary Care

## 2020-06-12 ENCOUNTER — Other Ambulatory Visit: Payer: Self-pay

## 2020-06-12 ENCOUNTER — Ambulatory Visit
Admission: RE | Admit: 2020-06-12 | Discharge: 2020-06-12 | Disposition: A | Discharge status: Home / Self Care | Payer: Medicare Other | Source: Ambulatory Visit | Attending: Primary Care | Admitting: Primary Care

## 2020-06-12 DIAGNOSIS — N281 Cyst of kidney, acquired: Secondary | ICD-10-CM

## 2020-06-12 MED ORDER — GADOBENATE DIMEGLUMINE 529 MG/ML IV SOLN
14.0000 mL | Freq: Once | INTRAVENOUS | Status: AC | PRN
  Administered 2020-06-12: 14 mL via INTRAVENOUS

## 2020-07-10 DIAGNOSIS — K219 Gastro-esophageal reflux disease without esophagitis: Secondary | ICD-10-CM

## 2020-08-05 MED ORDER — FAMOTIDINE 20 MG PO TABS: 20.0000 mg | ORAL_TABLET | Freq: Two times a day (BID) | ORAL | 1 refills | Status: DC

## 2020-08-05 MED ORDER — FAMOTIDINE 20 MG PO TABS: 20.0000 mg | ORAL_TABLET | Freq: Two times a day (BID) | ORAL | 0 refills | Status: DC

## 2020-10-21 ENCOUNTER — Other Ambulatory Visit: Payer: Self-pay

## 2020-10-21 ENCOUNTER — Encounter: Payer: Self-pay | Admitting: Primary Care

## 2020-10-21 ENCOUNTER — Ambulatory Visit (INDEPENDENT_AMBULATORY_CARE_PROVIDER_SITE_OTHER)
Admission: RE | Admit: 2020-10-21 | Discharge: 2020-10-21 | Disposition: A | Discharge status: Home / Self Care | Payer: Medicare Other | Source: Ambulatory Visit | Attending: Primary Care | Admitting: Primary Care

## 2020-10-21 ENCOUNTER — Ambulatory Visit (INDEPENDENT_AMBULATORY_CARE_PROVIDER_SITE_OTHER): Payer: Medicare Other | Admitting: Primary Care

## 2020-10-21 VITALS — BP 127/82 | HR 88 | Temp 98.1°F | Ht 63.0 in | Wt 159.0 lb

## 2020-10-21 DIAGNOSIS — M542 Cervicalgia: Secondary | ICD-10-CM

## 2020-10-21 DIAGNOSIS — Z23 Encounter for immunization: Secondary | ICD-10-CM | POA: Diagnosis not present

## 2020-10-21 DIAGNOSIS — R5383 Other fatigue: Secondary | ICD-10-CM | POA: Diagnosis not present

## 2020-10-21 DIAGNOSIS — M47812 Spondylosis without myelopathy or radiculopathy, cervical region: Secondary | ICD-10-CM | POA: Diagnosis not present

## 2020-10-21 DIAGNOSIS — G8929 Other chronic pain: Secondary | ICD-10-CM | POA: Insufficient documentation

## 2020-10-21 DIAGNOSIS — M2578 Osteophyte, vertebrae: Secondary | ICD-10-CM | POA: Diagnosis not present

## 2020-10-21 LAB — TSH: TSH: 2.16 u[IU]/mL (ref 0.35–4.50)

## 2020-10-21 LAB — BASIC METABOLIC PANEL
BUN: 13 mg/dL (ref 6–23)
CO2: 24 mEq/L (ref 19–32)
Calcium: 10.2 mg/dL (ref 8.4–10.5)
Chloride: 105 mEq/L (ref 96–112)
Creatinine, Ser: 1.08 mg/dL (ref 0.40–1.20)
GFR: 52.07 mL/min — ABNORMAL LOW (ref >60.00)
Glucose, Bld: 187 mg/dL — ABNORMAL HIGH (ref 70–99)
Potassium: 4.8 mEq/L (ref 3.5–5.1)
Sodium: 137 mEq/L (ref 135–145)

## 2020-10-21 LAB — CBC
HCT: 38.5 % (ref 36.0–46.0)
Hemoglobin: 12.6 g/dL (ref 12.0–15.0)
MCHC: 32.7 g/dL (ref 30.0–36.0)
MCV: 87.6 fl (ref 78.0–100.0)
Platelets: 223 10*3/uL (ref 150.0–400.0)
RBC: 4.39 Mil/uL (ref 3.87–5.11)
RDW: 14.2 % (ref 11.5–15.5)
WBC: 8.8 10*3/uL (ref 4.0–10.5)

## 2020-10-21 LAB — VITAMIN B12: Vitamin B-12: 88 pg/mL — ABNORMAL LOW (ref 211–911)

## 2020-10-21 NOTE — Progress Notes (Signed)
Subjective:    Patient ID: Brenda Contreras, female    DOB: 03-15-1951, 69 y.o.   MRN: [id number]  HPI  This visit occurred during the SARS-CoV-2 public health emergency.  Safety protocols were in place, including screening questions prior to the visit, additional usage of staff PPE, and extensive cleaning of exam room while observing appropriate contact time as indicated for disinfecting solutions.   Ms. Vohra is a 69 year old female with a history of hypertension, type 2 diabetes, CKD Stage III, hyperlipidemia who presents today with a chief complaint of neck pain.  Her pain is located to the right upper lateral neck with radiation to the right posterior neck and down the side of the right lateral neck. Pain began about six months ago which intermittent and infrequent. Over the last 1-2 months she's noticed a gradual increase in frequency and duration of pain. Her pain is now daily and constant,sometimes a pressure, sometimes a "sore" feeling. She's also noticed some imbalance, fatigue, "feels wiped out all the time".   She's taking Extra Strength Tylenol once daily, everyday, with temporary improvement. She denies photophobia, phonophobia, nausea, headaches, diplopia, confusion, dizziness.   Review of Systems  Constitutional: Positive for fatigue.  Eyes: Negative for photophobia and visual disturbance.  Cardiovascular: Negative for chest pain.  Gastrointestinal: Negative for nausea.  Musculoskeletal: Positive for neck pain.  Neurological: Negative for weakness.       Imbalance feeling       Past Medical History:  Diagnosis Date  . Acute diverticulitis 05/08/2020  . Diabetes mellitus   . 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 Dizziness   . GERD (gastroesophageal reflux disease)   . High cholesterol   . Hypertension   . Vaginal dryness      Social History   Socioeconomic History  . Marital status: Widowed    Spouse name: Not on file  . Number of children: Not on  file  . Years of education: Not on file  . Highest education level: Not on file  Occupational History  . Not on file  Tobacco Use  . Smoking status: Never Smoker  . Smokeless tobacco: Never Used  Vaping Use  . Vaping Use: Never used  Substance and Sexual Activity  . Alcohol use: No  . Drug use: No  . Sexual activity: Not Currently  Other Topics Concern  . 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      Has two children and two grandchildren.      Does not have a living will.  Desires CPR, would not want prolonged life support if futile.   Social Determinants of Health   Financial Resource Strain: Low Risk   . Difficulty of Paying Living Expenses: Not hard at all  Food Insecurity: No Food Insecurity  . Worried About Charity fundraiser in the Last Year: Never true  . Ran Out of Food in the Last Year: Never true  Transportation Needs: No Transportation Needs  . Lack of Transportation (Medical): No  . Lack of Transportation (Non-Medical): No  Physical Activity: Insufficiently Active  . Days of Exercise per Week: 3 days  . Minutes of Exercise per Session: 30 min  Stress: No Stress Concern Present  . Feeling of Stress : Not at all  Social Connections:   . Frequency of Communication with Friends and Family: Not on file  . Frequency of Social Gatherings  with Friends and Family: Not on file  . Attends Religious Services: Not on file  . Active Member of Clubs or Organizations: Not on file  . Attends Archivist Meetings: Not on file  . Marital Status: Not on file  Intimate Partner Violence: Not At Risk  . Fear of Current or Ex-Partner: No  . Emotionally Abused: No  . Physically Abused: No  . Sexually Abused: No    Past Surgical History:  Procedure Laterality Date  . APPENDECTOMY    . BREAST BIOPSY    . TUBAL LIGATION      Family History  Problem Relation Age of Onset  . Diabetes Father   . Hypertension Father   .  Hyperlipidemia Father   . Stroke Father   . Diabetes Sister   . Hyperlipidemia Sister   . Hypertension Sister   . Lung cancer Paternal Uncle   . Diabetes Paternal Grandmother   . Heart disease Paternal Grandfather     No Known Allergies  Current Outpatient Medications on File Prior to Visit  Medication Sig Dispense Refill  . acetaminophen (TYLENOL) 500 MG tablet Take 1,000 mg by mouth every 6 (six) hours as needed for mild pain.    Marland Kitchen amLODipine (NORVASC) 10 MG tablet Take 1 tablet (10 mg total) by mouth daily. For blood pressure. 90 tablet 1  . aspirin EC 81 MG tablet Take 81 mg by mouth daily.    . Blood Glucose Monitoring Suppl (ONE TOUCH ULTRA 2) w/Device KIT Use as instructed to test blood sugar daily. Dx is E11.9 1 each 0  . carvedilol (COREG) 6.25 MG tablet Take 1 tablet (6.25 mg total) by mouth 2 (two) times daily with a meal. For blood pressure. 180 tablet 3  . dapagliflozin propanediol (FARXIGA) 5 MG TABS tablet Take 5 mg by mouth daily. For diabetes. 90 tablet 3  . famotidine (PEPCID) 20 MG tablet Take 1 tablet (20 mg total) by mouth 2 (two) times daily. For heartburn. 180 tablet 1  . glucose blood (ONE TOUCH ULTRA TEST) test strip OneTouch Use as instructed to test blood sugar twice daily. Dx is E11.9 100 each 5  . losartan (COZAAR) 100 MG tablet Take 1 tablet (100 mg total) by mouth daily. For blood pressure. 90 tablet 3  . ONE TOUCH LANCETS MISC Use as instructed to test blood sugar daily. Dx is E11.9 200 each 2  . rosuvastatin (CRESTOR) 20 MG tablet Take 1 tablet (20 mg total) by mouth at bedtime. For cholesterol. 90 tablet 3  . sitaGLIPtin-metformin (JANUMET) 50-1000 MG tablet Take 1 tablet by mouth 2 (two) times daily with a meal. For diabetes. 180 tablet 3   No current facility-administered medications on file prior to visit.    BP 127/82   Pulse 88   Temp 98.1 F (36.7 C) (Temporal)   Ht '5\' 3"'  (1.6 m)   Wt 159 lb (72.1 kg)   SpO2 98%   BMI 28.17 kg/m     Objective:   Physical Exam HENT:     Right Ear: Tympanic membrane and ear canal normal.     Left Ear: Tympanic membrane and ear canal normal.  Eyes:     Extraocular Movements: Extraocular movements intact.     Pupils: Pupils are equal, round, and reactive to light.  Neck:      Comments: "pulling" sensation with right lateral rotation and bending.  Cardiovascular:     Rate and Rhythm: Normal rate and regular rhythm.  Pulmonary:  Effort: Pulmonary effort is normal.     Breath sounds: Normal breath sounds.  Musculoskeletal:     Cervical back: Normal range of motion and neck supple. No spinous process tenderness.  Skin:    General: Skin is warm and dry.  Neurological:     Mental Status: She is oriented to person, place, and time.     Cranial Nerves: No cranial nerve deficit.     Motor: No weakness.     Coordination: Romberg sign negative. Coordination normal.            Assessment & Plan:

## 2020-10-21 NOTE — Patient Instructions (Addendum)
Please call to reschedule your mammogram.   Complete xray(s) and labs prior to leaving today. I will notify you of your results once received.  It was a pleasure to see you today!   Influenza (Flu) Vaccine (Inactivated or Recombinant): What You Need to Know 1. Why get vaccinated? Influenza vaccine can prevent influenza (flu). Flu is a contagious disease that spreads around the [place] every year, usually between October and May. Anyone can get the flu, but it is more dangerous for some people. Infants and young children, people 72 years of age and older, pregnant women, and people with certain health conditions or a weakened immune system are at greatest risk of flu complications. Pneumonia, bronchitis, sinus infections and ear infections are examples of flu-related complications. If you have a medical condition, such as heart disease, cancer or diabetes, flu can make it worse. Flu can cause fever and chills, sore throat, muscle aches, fatigue, cough, headache, and runny or stuffy nose. Some people may have vomiting and diarrhea, though this is more common in children than adults. Each year thousands of people in the [place] die from flu, and many more are hospitalized. Flu vaccine prevents millions of illnesses and flu-related visits to the doctor each year. 2. Influenza vaccine CDC recommends everyone 33 months of age and older get vaccinated every flu season. Children 6 months through 48 years of age may need 2 doses during a single flu season. Everyone else needs only 1 dose each flu season. It takes about 2 weeks for protection to develop after vaccination. There are many flu viruses, and they are always changing. Each year a new flu vaccine is made to protect against three or four viruses that are likely to cause disease in the upcoming flu season. Even when the vaccine doesn't exactly match these viruses, it may still provide some protection. Influenza vaccine does not cause  flu. Influenza vaccine may be given at the same time as other vaccines. 3. Talk with your health care provider Tell your vaccine provider if the person getting the vaccine:  Has had an allergic reaction after a previous dose of influenza vaccine, or has any severe, life-threatening allergies.  Has ever had Guillain-Barr Syndrome (also called GBS). In some cases, your health care provider may decide to postpone influenza vaccination to a future visit. People with minor illnesses, such as a cold, may be vaccinated. People who are moderately or severely ill should usually wait until they recover before getting influenza vaccine. Your health care provider can give you more information. 4. Risks of a vaccine reaction  Soreness, redness, and swelling where shot is given, fever, muscle aches, and headache can happen after influenza vaccine.  There may be a very small increased risk of Guillain-Barr Syndrome (GBS) after inactivated influenza vaccine (the flu shot). Young children who get the flu shot along with pneumococcal vaccine (PCV13), and/or DTaP vaccine at the same time might be slightly more likely to have a seizure caused by fever. Tell your health care provider if a child who is getting flu vaccine has ever had a seizure. People sometimes faint after medical procedures, including vaccination. Tell your provider if you feel dizzy or have vision changes or ringing in the ears. As with any medicine, there is a very remote chance of a vaccine causing a severe allergic reaction, other serious injury, or death. 5. What if there is a serious problem? An allergic reaction could occur after the vaccinated person leaves the clinic. If you  see signs of a severe allergic reaction (hives, swelling of the face and throat, difficulty breathing, a fast heartbeat, dizziness, or weakness), call 9-1-1 and get the person to the nearest hospital. For other signs that concern you, call your health care  provider. Adverse reactions should be reported to the Vaccine Adverse Event Reporting System (VAERS). Your health care provider will usually file this report, or you can do it yourself. Visit the VAERS website at www.vaers.SamedayNews.es or call 5091166781.VAERS is only for reporting reactions, and VAERS staff do not give medical advice. 6. The National Vaccine Injury Compensation Program The [employer] Vaccine Injury Compensation Program (VICP) is a federal program that was created to compensate people who may have been injured by certain vaccines. Visit the VICP website at GoldCloset.com.ee or call (309)819-9039 to learn about the program and about filing a claim. There is a time limit to file a claim for compensation. 7. How can I learn more?  Ask your healthcare provider.  Call your local or state health department.  Contact the Centers for Disease Control and Prevention (CDC): ? Call 469-468-9531 (1-800-CDC-INFO) or ? Visit CDC's https://gibson.com/ Vaccine Information Statement (Interim) Inactivated Influenza Vaccine (08/10/2018) This information is not intended to replace advice given to you by your health care provider. Make sure you discuss any questions you have with your health care provider. Document Revised: 04/03/2019 Document Reviewed: 08/14/2018 Elsevier Patient Education  [employer].

## 2020-10-21 NOTE — Assessment & Plan Note (Addendum)
Chronic for the last 6 months, now more frequent. Taking daily Tylenol due to discomfort.  She doesn't seem to be experiencing migraines. Neuro exam unremarkable, ear canals and TM's unremarkable. She does notice her symptoms with ROM exercises, specifically with right lateral rotation and bending.   Suspect MSK. Start with plain films of cervical spine. Consider physical therapy. Await results.

## 2020-10-21 NOTE — Assessment & Plan Note (Signed)
Chronic for the last 6 months since neck pain began. Checking BMP, B12, CBC, TSH today.   Checking plain films of cervical spine.

## 2020-10-22 DIAGNOSIS — M542 Cervicalgia: Secondary | ICD-10-CM | POA: Diagnosis not present

## 2020-10-22 DIAGNOSIS — Z23 Encounter for immunization: Secondary | ICD-10-CM

## 2020-10-27 NOTE — Telephone Encounter (Signed)
See other open message.

## 2020-10-28 NOTE — Telephone Encounter (Signed)
Brenda Contreras, can we get her set up for B12 injections? Okay to forward to [place].

## 2020-10-29 NOTE — Telephone Encounter (Signed)
Called patient to schedule for B-12 injections. LVM to call back.

## 2020-10-29 NOTE — Telephone Encounter (Signed)
What is the frequency of injections so I can schedule?

## 2020-10-30 ENCOUNTER — Other Ambulatory Visit: Payer: Self-pay

## 2020-10-30 ENCOUNTER — Encounter: Payer: Self-pay | Admitting: Primary Care

## 2020-10-30 ENCOUNTER — Ambulatory Visit (INDEPENDENT_AMBULATORY_CARE_PROVIDER_SITE_OTHER): Payer: Medicare Other

## 2020-10-30 DIAGNOSIS — R5383 Other fatigue: Secondary | ICD-10-CM | POA: Diagnosis not present

## 2020-10-30 MED ORDER — CYANOCOBALAMIN 1000 MCG/ML IJ SOLN
1000.0000 ug | Freq: Once | INTRAMUSCULAR | Status: AC
  Administered 2020-10-30: 1000 ug via INTRAMUSCULAR

## 2020-10-30 NOTE — Progress Notes (Signed)
Per orders of Alma Friendly, injection of vitamin b12 given in left deltoid by Kem Parkinson. Patient tolerated injection well.

## 2020-10-31 ENCOUNTER — Telehealth: Payer: Self-pay | Admitting: *Deleted

## 2020-10-31 DIAGNOSIS — M542 Cervicalgia: Secondary | ICD-10-CM

## 2020-10-31 DIAGNOSIS — G8929 Other chronic pain: Secondary | ICD-10-CM

## 2020-10-31 MED ORDER — GABAPENTIN 100 MG PO CAPS: ORAL_CAPSULE | ORAL | 0 refills | Status: DC

## 2020-10-31 NOTE — Telephone Encounter (Signed)
I actually never received the my chart message with her response. It looks like it was forwarded without me attached.  Let's try some gabapentin 100 mg. She can take 1 to 2 capsules twice daily. I recommend she start with 1 capsule at bedtime for now as this can cause drowsiness. She can tweak the dose as mentioned. Have her update me.

## 2020-10-31 NOTE — Telephone Encounter (Signed)
Patient left a voicemail stating that she thought that Allie Bossier NP was going to send her medication in for the arthritis in her neck.. Patient stated that she would like for this to be done. Pharmacy [facility]

## 2020-10-31 NOTE — Telephone Encounter (Signed)
Patient returned call she has picked up medications and will start with one tab tonight and let us know how that goes. She did not have any questions at this time.

## 2020-10-31 NOTE — Telephone Encounter (Signed)
Left message to return call to our office.  

## 2020-11-03 ENCOUNTER — Telehealth: Payer: Self-pay

## 2020-11-03 NOTE — Telephone Encounter (Signed)
Nikol Freel KeyNorva Karvonen - PA Case ID: [id number] - Rx #: [id number] Need help? Call us at [phone] Status Sent to [facility] Gabapentin 100MG  capsules Form [employer] Electronic Prior Authorization Form (2017 NCPDP) Original Claim Info 48 01MA Plan-Part B coverage only +02For Part D claims-submit to Part D ID [id number]

## 2020-11-03 NOTE — Telephone Encounter (Signed)
Viewed chart and pt scheduled for b-12 injections

## 2020-11-05 NOTE — Telephone Encounter (Signed)
Received fax from insurance. They have denied as benefit exclusion. Form placed in your box for review.

## 2020-11-06 ENCOUNTER — Ambulatory Visit (INDEPENDENT_AMBULATORY_CARE_PROVIDER_SITE_OTHER): Payer: Medicare Other

## 2020-11-06 DIAGNOSIS — R5383 Other fatigue: Secondary | ICD-10-CM | POA: Diagnosis not present

## 2020-11-06 MED ORDER — CYANOCOBALAMIN 1000 MCG/ML IJ SOLN
1000.0000 ug | Freq: Once | INTRAMUSCULAR | Status: AC
  Administered 2020-11-06: 1000 ug via INTRAMUSCULAR

## 2020-11-06 NOTE — Telephone Encounter (Signed)
Noted, will send patient my chart message.

## 2020-11-06 NOTE — Progress Notes (Signed)
Per orders of Dr. Diona Browner in the absence of Alma Friendly NP, injection of vitamin b12 given right deltoid by Kem Parkinson. Patient tolerated injection well.

## 2020-11-13 ENCOUNTER — Other Ambulatory Visit: Payer: Self-pay

## 2020-11-13 ENCOUNTER — Ambulatory Visit (INDEPENDENT_AMBULATORY_CARE_PROVIDER_SITE_OTHER): Payer: Medicare Other

## 2020-11-13 DIAGNOSIS — E538 Deficiency of other specified B group vitamins: Secondary | ICD-10-CM

## 2020-11-13 MED ORDER — CYANOCOBALAMIN 1000 MCG/ML IJ SOLN
1000.0000 ug | Freq: Once | INTRAMUSCULAR | Status: AC
  Administered 2020-11-13: 1000 ug via INTRAMUSCULAR

## 2020-11-13 NOTE — Progress Notes (Signed)
Per orders of Kate Clark, NP, injection of B12, given by Hari Casaus, RN. Patient tolerated injection well in L Deltoid.    

## 2020-11-17 ENCOUNTER — Ambulatory Visit (INDEPENDENT_AMBULATORY_CARE_PROVIDER_SITE_OTHER): Payer: Medicare Other | Admitting: Primary Care

## 2020-11-17 ENCOUNTER — Encounter: Payer: Self-pay | Admitting: Primary Care

## 2020-11-17 ENCOUNTER — Other Ambulatory Visit: Payer: Self-pay

## 2020-11-17 VITALS — BP 118/74 | HR 76 | Temp 97.7°F | Ht 63.0 in | Wt 162.0 lb

## 2020-11-17 DIAGNOSIS — G8929 Other chronic pain: Secondary | ICD-10-CM

## 2020-11-17 DIAGNOSIS — E119 Type 2 diabetes mellitus without complications: Secondary | ICD-10-CM | POA: Diagnosis not present

## 2020-11-17 DIAGNOSIS — M542 Cervicalgia: Secondary | ICD-10-CM

## 2020-11-17 DIAGNOSIS — R5383 Other fatigue: Secondary | ICD-10-CM

## 2020-11-17 LAB — POCT GLYCOSYLATED HEMOGLOBIN (HGB A1C): Hemoglobin A1C: 7.4 % — AB (ref 4.0–5.6)

## 2020-11-17 NOTE — Assessment & Plan Note (Signed)
Improved with gabapentin 100 mg BID. She will notify when she is needing additional refills and update if she changes the dose.

## 2020-11-17 NOTE — Patient Instructions (Signed)
It's important to improve your diet by reducing consumption of fast food, fried food, processed snack foods, sugary drinks. Increase consumption of fresh vegetables and fruits, whole grains, water.  Ensure you are drinking 64 ounces of water daily.  Start exercising. You should be getting 150 minutes of moderate intensity exercise weekly.  We will see you in 6 months for your annual physical exam.   It was a pleasure to see you today!   Diabetes Mellitus and Nutrition, Adult When you have diabetes (diabetes mellitus), it is very important to have healthy eating habits because your blood sugar (glucose) levels are greatly affected by what you eat and drink. Eating healthy foods in the appropriate amounts, at about the same times every day, can help you:  Control your blood glucose.  Lower your risk of heart disease.  Improve your blood pressure.  Reach or maintain a healthy weight. Every person with diabetes is different, and each person has different needs for a meal plan. Your health care provider may recommend that you work with a diet and nutrition specialist (dietitian) to make a meal plan that is best for you. Your meal plan may vary depending on factors such as:  The calories you need.  The medicines you take.  Your weight.  Your blood glucose, blood pressure, and cholesterol levels.  Your activity level.  Other health conditions you have, such as heart or kidney disease. How do carbohydrates affect me? Carbohydrates, also called carbs, affect your blood glucose level more than any other type of food. Eating carbs naturally raises the amount of glucose in your blood. Carb counting is a method for keeping track of how many carbs you eat. Counting carbs is important to keep your blood glucose at a healthy level, especially if you use insulin or take certain oral diabetes medicines. It is important to know how many carbs you can safely have in each meal. This is different for  every person. Your dietitian can help you calculate how many carbs you should have at each meal and for each snack. Foods that contain carbs include:  Bread, cereal, rice, pasta, and crackers.  Potatoes and corn.  Peas, beans, and lentils.  Milk and yogurt.  Fruit and juice.  Desserts, such as cakes, cookies, ice cream, and candy. How does alcohol affect me? Alcohol can cause a sudden decrease in blood glucose (hypoglycemia), especially if you use insulin or take certain oral diabetes medicines. Hypoglycemia can be a life-threatening condition. Symptoms of hypoglycemia (sleepiness, dizziness, and confusion) are similar to symptoms of having too much alcohol. If your health care provider says that alcohol is safe for you, follow these guidelines:  Limit alcohol intake to no more than 1 drink per day for nonpregnant women and 2 drinks per day for men. One drink equals 12 oz of beer, 5 oz of wine, or 1 oz of hard liquor.  Do not drink on an empty stomach.  Keep yourself hydrated with water, diet soda, or unsweetened iced tea.  Keep in mind that regular soda, juice, and other mixers may contain a lot of sugar and must be counted as carbs. What are tips for following this plan?  Reading food labels  Start by checking the serving size on the "Nutrition Facts" label of packaged foods and drinks. The amount of calories, carbs, fats, and other nutrients listed on the label is based on one serving of the item. Many items contain more than one serving per package.  Check the  total grams (g) of carbs in one serving. You can calculate the number of servings of carbs in one serving by dividing the total carbs by 15. For example, if a food has 30 g of total carbs, it would be equal to 2 servings of carbs.  Check the number of grams (g) of saturated and trans fats in one serving. Choose foods that have low or no amount of these fats.  Check the number of milligrams (mg) of salt (sodium) in one  serving. Most people should limit total sodium intake to less than 2,300 mg per day.  Always check the nutrition information of foods labeled as "low-fat" or "nonfat". These foods may be higher in added sugar or refined carbs and should be avoided.  Talk to your dietitian to identify your daily goals for nutrients listed on the label. Shopping  Avoid buying canned, premade, or processed foods. These foods tend to be high in fat, sodium, and added sugar.  Shop around the outside edge of the grocery store. This includes fresh fruits and vegetables, bulk grains, fresh meats, and fresh dairy. Cooking  Use low-heat cooking methods, such as baking, instead of high-heat cooking methods like deep frying.  Cook using healthy oils, such as olive, canola, or sunflower oil.  Avoid cooking with butter, cream, or high-fat meats. Meal planning  Eat meals and snacks regularly, preferably at the same times every day. Avoid going long periods of time without eating.  Eat foods high in fiber, such as fresh fruits, vegetables, beans, and whole grains. Talk to your dietitian about how many servings of carbs you can eat at each meal.  Eat 4-6 ounces (oz) of lean protein each day, such as lean meat, chicken, fish, eggs, or tofu. One oz of lean protein is equal to: ? 1 oz of meat, chicken, or fish. ? 1 egg. ?  cup of tofu.  Eat some foods each day that contain healthy fats, such as avocado, nuts, seeds, and fish. Lifestyle  Check your blood glucose regularly.  Exercise regularly as told by your health care provider. This may include: ? 150 minutes of moderate-intensity or vigorous-intensity exercise each week. This could be brisk walking, biking, or water aerobics. ? Stretching and doing strength exercises, such as yoga or weightlifting, at least 2 times a week.  Take medicines as told by your health care provider.  Do not use any products that contain nicotine or tobacco, such as cigarettes and  e-cigarettes. If you need help quitting, ask your health care provider.  Work with a Social worker or diabetes educator to identify strategies to manage stress and any emotional and social challenges. Questions to ask a health care provider  Do I need to meet with a diabetes educator?  Do I need to meet with a dietitian?  What number can I call if I have questions?  When are the best times to check my blood glucose? Where to find more information:  American Diabetes Association: diabetes.org  Academy of Nutrition and Dietetics: www.eatright.[employer] of Diabetes and Digestive and Kidney Diseases (NIH): [employer] Summary  A healthy meal plan will help you control your blood glucose and maintain a healthy lifestyle.  Working with a diet and nutrition specialist (dietitian) can help you make a meal plan that is best for you.  Keep in mind that carbohydrates (carbs) and alcohol have immediate effects on your blood glucose levels. It is important to count carbs and to use alcohol carefully. This information is  not intended to replace advice given to you by your health care provider. Make sure you discuss any questions you have with your health care provider. Document Revised: 11/25/2017 Document Reviewed: 01/17/2017 Elsevier Patient Education  2020 [employer].

## 2020-11-17 NOTE — Progress Notes (Signed)
Subjective:    Patient ID: Brenda Contreras, female    DOB: 07/25/1951, 70 y.o.   MRN: [id number]  HPI  This visit occurred during the SARS-CoV-2 public health emergency.  Safety protocols were in place, including screening questions prior to the visit, additional usage of staff PPE, and extensive cleaning of exam room while observing appropriate contact time as indicated for disinfecting solutions.   Brenda Contreras is a 69 year old female with a history of hypertension, type 2 diabetes, CKD, hyperlipidemia who presents today for follow up of type 2 diabetes.  Current medications include: Farxiga 5 mg, sitagliptin-metformin 50-1000 mg BID.   She is checking her blood glucose 1 times daily and is getting readings of:  AM fasting mid 100's to low 200's.   Last A1C: 7.5 in May 2021 Last Eye Exam: UTD  Last Foot Exam: UTD Pneumonia Vaccination: Completed last in 2018 ACE/ARB: Losartan  Statin: Crestor  She endorses a poor diet since Covid-19 pandemic and has not been exercising. She's been eating more sweets, drinking little water. Fatigue is improving with B12 injections. Neck pain has decreased with gabapentin 100 mg BID, doing well on this regimen.  BP Readings from Last 3 Encounters:  11/17/20 118/74  10/21/20 127/82  05/30/20 130/80     Review of Systems  Constitutional:       Fatigue is improving with B12 injections   Respiratory: Negative for shortness of breath.   Cardiovascular: Negative for chest pain.  Musculoskeletal: Positive for neck pain.       Decreased neck pain with gabapentin   Neurological: Negative for dizziness.       Past Medical History:  Diagnosis Date  . Acute diverticulitis 05/08/2020  . Diabetes mellitus   . 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 Dizziness   . GERD (gastroesophageal reflux disease)   . High cholesterol   . Hypertension   . Vaginal dryness      Social History   Socioeconomic History  . Marital status: Widowed     Spouse name: Not on file  . Number of children: Not on file  . Years of education: Not on file  . Highest education level: Not on file  Occupational History  . Not on file  Tobacco Use  . Smoking status: Never Smoker  . Smokeless tobacco: Never Used  Vaping Use  . Vaping Use: Never used  Substance and Sexual Activity  . Alcohol use: No  . Drug use: No  . Sexual activity: Not Currently  Other Topics Concern  . 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      Has two children and two grandchildren.      Does not have a living will.  Desires CPR, would not want prolonged life support if futile.   Social Determinants of Health   Financial Resource Strain: Low Risk   . Difficulty of Paying Living Expenses: Not hard at all  Food Insecurity: No Food Insecurity  . Worried About Charity fundraiser in the Last Year: Never true  . Ran Out of Food in the Last Year: Never true  Transportation Needs: No Transportation Needs  . Lack of Transportation (Medical): No  . Lack of Transportation (Non-Medical): No  Physical Activity: Insufficiently Active  . Days of Exercise per Week: 3 days  . Minutes of Exercise per Session: 30 min  Stress: No Stress Concern Present  .  Feeling of Stress : Not at all  Social Connections:   . Frequency of Communication with Friends and Family: Not on file  . Frequency of Social Gatherings with Friends and Family: Not on file  . Attends Religious Services: Not on file  . Active Member of Clubs or Organizations: Not on file  . Attends Archivist Meetings: Not on file  . Marital Status: Not on file  Intimate Partner Violence: Not At Risk  . Fear of Current or Ex-Partner: No  . Emotionally Abused: No  . Physically Abused: No  . Sexually Abused: No    Past Surgical History:  Procedure Laterality Date  . APPENDECTOMY    . BREAST BIOPSY    . TUBAL LIGATION      Family History  Problem Relation Age of Onset  .  Diabetes Father   . Hypertension Father   . Hyperlipidemia Father   . Stroke Father   . Diabetes Sister   . Hyperlipidemia Sister   . Hypertension Sister   . Lung cancer Paternal Uncle   . Diabetes Paternal Grandmother   . Heart disease Paternal Grandfather     No Known Allergies  Current Outpatient Medications on File Prior to Visit  Medication Sig Dispense Refill  . acetaminophen (TYLENOL) 500 MG tablet Take 1,000 mg by mouth every 6 (six) hours as needed for mild pain.    Marland Kitchen amLODipine (NORVASC) 10 MG tablet Take 1 tablet (10 mg total) by mouth daily. For blood pressure. 90 tablet 1  . aspirin EC 81 MG tablet Take 81 mg by mouth daily.    . Blood Glucose Monitoring Suppl (ONE TOUCH ULTRA 2) w/Device KIT Use as instructed to test blood sugar daily. Dx is E11.9 1 each 0  . carvedilol (COREG) 6.25 MG tablet Take 1 tablet (6.25 mg total) by mouth 2 (two) times daily with a meal. For blood pressure. 180 tablet 3  . dapagliflozin propanediol (FARXIGA) 5 MG TABS tablet Take 5 mg by mouth daily. For diabetes. 90 tablet 3  . famotidine (PEPCID) 20 MG tablet Take 1 tablet (20 mg total) by mouth 2 (two) times daily. For heartburn. 180 tablet 1  . gabapentin (NEURONTIN) 100 MG capsule Take 1 to 2 capsules by mouth once or twice daily for neck pain. 60 capsule 0  . glucose blood (ONE TOUCH ULTRA TEST) test strip OneTouch Use as instructed to test blood sugar twice daily. Dx is E11.9 100 each 5  . losartan (COZAAR) 100 MG tablet Take 1 tablet (100 mg total) by mouth daily. For blood pressure. 90 tablet 3  . ONE TOUCH LANCETS MISC Use as instructed to test blood sugar daily. Dx is E11.9 200 each 2  . rosuvastatin (CRESTOR) 20 MG tablet Take 1 tablet (20 mg total) by mouth at bedtime. For cholesterol. 90 tablet 3  . sitaGLIPtin-metformin (JANUMET) 50-1000 MG tablet Take 1 tablet by mouth 2 (two) times daily with a meal. For diabetes. 180 tablet 3   No current facility-administered medications on file  prior to visit.    BP 118/74   Pulse 76   Temp 97.7 F (36.5 C) (Temporal)   Ht _0  (1.6 m)   Wt 162 lb (73.5 kg)   SpO2 98%   BMI 28.70 kg/m    Objective:   Physical Exam Cardiovascular:     Rate and Rhythm: Normal rate and regular rhythm.  Pulmonary:     Effort: Pulmonary effort is normal.  Breath sounds: Normal breath sounds.  Musculoskeletal:     Cervical back: Neck supple.  Skin:    General: Skin is warm and dry.            Assessment & Plan:

## 2020-11-17 NOTE — Assessment & Plan Note (Signed)
A1C of 7.4 today which is about the same as last check. Glucose readings are too high, encouraged dietary improvements.   Foot and eye exams UTD. Pneumonia vaccination UTD. Managed on statin and ARB.  Follow up in 6 months. Continue Farxiga 5 mg daily and Janumet 50-1000 mg daily as prescribed.

## 2020-11-17 NOTE — Assessment & Plan Note (Signed)
B12 level of 88 after this visit. She is feeling better with B12 injections, continue injections as scheduled. Repeat B12 level in a few months.   Consider oral agents if able to absorb.

## 2020-11-25 ENCOUNTER — Other Ambulatory Visit: Payer: Self-pay

## 2020-11-25 ENCOUNTER — Ambulatory Visit (INDEPENDENT_AMBULATORY_CARE_PROVIDER_SITE_OTHER): Payer: Medicare Other

## 2020-11-25 DIAGNOSIS — E538 Deficiency of other specified B group vitamins: Secondary | ICD-10-CM

## 2020-11-25 MED ORDER — CYANOCOBALAMIN 1000 MCG/ML IJ SOLN
1000.0000 ug | Freq: Once | INTRAMUSCULAR | Status: AC
  Administered 2020-11-25: 1000 ug via INTRAMUSCULAR

## 2020-11-25 NOTE — Progress Notes (Signed)
Per orders of Dr. Tera Helper, injection of Vitamin b12 given by Diamond Nickel, RN.  Administered 1 ml to R deltoid.  Patient tolerated injection well.

## 2020-12-10 ENCOUNTER — Ambulatory Visit (INDEPENDENT_AMBULATORY_CARE_PROVIDER_SITE_OTHER): Payer: Medicare Other

## 2020-12-10 ENCOUNTER — Other Ambulatory Visit: Payer: Self-pay

## 2020-12-10 DIAGNOSIS — E538 Deficiency of other specified B group vitamins: Secondary | ICD-10-CM

## 2020-12-10 MED ORDER — CYANOCOBALAMIN 1000 MCG/ML IJ SOLN
1000.0000 ug | Freq: Once | INTRAMUSCULAR | Status: AC
  Administered 2020-12-10: 1000 ug via INTRAMUSCULAR

## 2020-12-10 NOTE — Progress Notes (Signed)
Per orders of Kate Clark, NP, injection of B12, given by Amarisa Wilinski, RN. Patient tolerated injection well in L Deltoid.    

## 2020-12-24 ENCOUNTER — Other Ambulatory Visit: Payer: Self-pay

## 2020-12-24 ENCOUNTER — Ambulatory Visit (INDEPENDENT_AMBULATORY_CARE_PROVIDER_SITE_OTHER): Payer: Medicare Other

## 2020-12-24 DIAGNOSIS — E538 Deficiency of other specified B group vitamins: Secondary | ICD-10-CM | POA: Diagnosis not present

## 2020-12-24 MED ORDER — CYANOCOBALAMIN 1000 MCG/ML IJ SOLN
1000.0000 ug | Freq: Once | INTRAMUSCULAR | Status: AC
  Administered 2020-12-24: 1000 ug via INTRAMUSCULAR

## 2020-12-24 NOTE — Progress Notes (Signed)
B-12 1000 mcg given per orders of Mayra Reel. Given in Rt Deltoid and pt tolerated well. # 2 of 2 every other week.

## 2021-01-16 ENCOUNTER — Other Ambulatory Visit: Payer: Self-pay

## 2021-01-16 DIAGNOSIS — I10 Essential (primary) hypertension: Secondary | ICD-10-CM

## 2021-01-16 DIAGNOSIS — K219 Gastro-esophageal reflux disease without esophagitis: Secondary | ICD-10-CM

## 2021-01-16 MED ORDER — AMLODIPINE BESYLATE 10 MG PO TABS: 10.0000 mg | ORAL_TABLET | Freq: Every day | ORAL | 1 refills | Status: DC

## 2021-01-16 MED ORDER — FAMOTIDINE 20 MG PO TABS: 20.0000 mg | ORAL_TABLET | Freq: Two times a day (BID) | ORAL | 1 refills | Status: DC

## 2021-01-17 ENCOUNTER — Other Ambulatory Visit: Payer: Self-pay

## 2021-01-17 DIAGNOSIS — I10 Essential (primary) hypertension: Secondary | ICD-10-CM

## 2021-01-17 DIAGNOSIS — K219 Gastro-esophageal reflux disease without esophagitis: Secondary | ICD-10-CM

## 2021-01-17 MED ORDER — AMLODIPINE BESYLATE 10 MG PO TABS: 10.0000 mg | ORAL_TABLET | Freq: Every day | ORAL | 1 refills | Status: DC

## 2021-01-17 MED ORDER — FAMOTIDINE 20 MG PO TABS: 20.0000 mg | ORAL_TABLET | Freq: Two times a day (BID) | ORAL | 1 refills | Status: DC

## 2021-02-03 ENCOUNTER — Other Ambulatory Visit: Payer: Self-pay

## 2021-02-03 ENCOUNTER — Ambulatory Visit (INDEPENDENT_AMBULATORY_CARE_PROVIDER_SITE_OTHER): Payer: Medicare Other

## 2021-02-03 DIAGNOSIS — E538 Deficiency of other specified B group vitamins: Secondary | ICD-10-CM

## 2021-02-03 MED ORDER — CYANOCOBALAMIN 1000 MCG/ML IJ SOLN
1000.0000 ug | Freq: Once | INTRAMUSCULAR | Status: AC
  Administered 2021-02-03: 1000 ug via INTRAMUSCULAR

## 2021-02-03 NOTE — Progress Notes (Signed)
Per orders of Allie Bossier, NP, injection of B12, given by Aneta Mins, RN. Patient tolerated injection well in L Deltoid.

## 2021-02-10 NOTE — Progress Notes (Signed)
Approved.  

## 2021-03-04 DIAGNOSIS — Z1231 Encounter for screening mammogram for malignant neoplasm of breast: Secondary | ICD-10-CM | POA: Diagnosis not present

## 2021-05-05 ENCOUNTER — Other Ambulatory Visit: Payer: Self-pay

## 2021-05-05 ENCOUNTER — Encounter: Payer: Self-pay | Admitting: Primary Care

## 2021-05-05 ENCOUNTER — Ambulatory Visit (INDEPENDENT_AMBULATORY_CARE_PROVIDER_SITE_OTHER): Payer: Medicare Other | Admitting: Primary Care

## 2021-05-05 VITALS — BP 114/64 | HR 70 | Temp 97.6°F | Ht 63.0 in | Wt 141.8 lb

## 2021-05-05 DIAGNOSIS — E119 Type 2 diabetes mellitus without complications: Secondary | ICD-10-CM | POA: Diagnosis not present

## 2021-05-05 DIAGNOSIS — N289 Disorder of kidney and ureter, unspecified: Secondary | ICD-10-CM

## 2021-05-05 DIAGNOSIS — I10 Essential (primary) hypertension: Secondary | ICD-10-CM | POA: Diagnosis not present

## 2021-05-05 DIAGNOSIS — E785 Hyperlipidemia, unspecified: Secondary | ICD-10-CM | POA: Diagnosis not present

## 2021-05-05 LAB — LIPID PANEL
Cholesterol: 120 mg/dL (ref 0–200)
HDL: 40.4 mg/dL (ref >39.00)
LDL Cholesterol: 46 mg/dL (ref 0–99)
NonHDL: 79.52
Total CHOL/HDL Ratio: 3
Triglycerides: 167 mg/dL — ABNORMAL HIGH (ref 0.0–149.0)
VLDL: 33.4 mg/dL (ref 0.0–40.0)

## 2021-05-05 LAB — COMPREHENSIVE METABOLIC PANEL
ALT: 23 U/L (ref 0–35)
AST: 21 U/L (ref 0–37)
Albumin: 4.4 g/dL (ref 3.5–5.2)
Alkaline Phosphatase: 78 U/L (ref 39–117)
BUN: 39 mg/dL — ABNORMAL HIGH (ref 6–23)
CO2: 21 mEq/L (ref 19–32)
Calcium: 10.3 mg/dL (ref 8.4–10.5)
Chloride: 104 mEq/L (ref 96–112)
Creatinine, Ser: 1.45 mg/dL — ABNORMAL HIGH (ref 0.40–1.20)
GFR: 36.43 mL/min — ABNORMAL LOW (ref >60.00)
Glucose, Bld: 98 mg/dL (ref 70–99)
Potassium: 5.2 mEq/L — ABNORMAL HIGH (ref 3.5–5.1)
Sodium: 134 mEq/L — ABNORMAL LOW (ref 135–145)
Total Bilirubin: 1.3 mg/dL — ABNORMAL HIGH (ref 0.2–1.2)
Total Protein: 6.9 g/dL (ref 6.0–8.3)

## 2021-05-05 LAB — HEMOGLOBIN A1C: Hgb A1c MFr Bld: 5.7 % (ref 4.6–6.5)

## 2021-05-05 NOTE — Assessment & Plan Note (Signed)
Well controlled today, is getting lower readings at home.  Given 14 pound weight loss, coupled with positional dizziness, will discontinue amlodipine 10 mg. Continue losartan 100 mg and carvedilol 6.25 mg BID.  She will monitor and notify if readings remain lower or if are consistently above 140/90.

## 2021-05-05 NOTE — Progress Notes (Signed)
Subjective:    Patient ID: Brenda Contreras, female    DOB: [birth year], 70 y.o.   MRN: [id number]  HPI  Brenda Contreras is a very pleasant 70 y.o. female with a history of hypertension, type 2 diabetes, CKD, hyperlipidemia who presents today for follow up of hypertension.   She was last evaluated in November 2021 for diabetes follow up.  Since her last visit she's joined a gym, has eliminated processed foods, and has lost 14 pounds! Since she's improved her lifestyle her blood pressure readings in the low 90's/50's, blood glucose levels are in the 90's.   She brought her BP and glucose readings from home which are ranging mostly 110's/40's-60's. Glucose readings are mostly 90's- low 100's. She does notice dizziness with quick movements, sometimes feels weak. Symptoms began after weight loss. She has noticed chronic cramping/aches to her lower extremities that will sometimes wake her from sleep at night.   BP Readings from Last 3 Encounters:  05/05/21 114/64  11/17/20 118/74  10/21/20 127/82   Wt Readings from Last 3 Encounters:  05/05/21 141 lb 12.8 oz (64.3 kg)  11/17/20 162 lb (73.5 kg)  10/21/20 159 lb (72.1 kg)     Review of Systems  Respiratory: Negative for shortness of breath.   Cardiovascular: Negative for chest pain.  Neurological: Positive for light-headedness.         Past Medical History:  Diagnosis Date  . Acute diverticulitis 05/08/2020  . Diabetes mellitus   . 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 Dizziness   . GERD (gastroesophageal reflux disease)   . High cholesterol   . Hypertension   . Vaginal dryness     Social History   Socioeconomic History  . Marital status: Widowed    Spouse name: Not on file  . Number of children: Not on file  . Years of education: Not on file  . Highest education level: Not on file  Occupational History  . Not on file  Tobacco Use  . Smoking status: Never Smoker  . Smokeless tobacco: Never Used  Vaping  Use  . Vaping Use: Never used  Substance and Sexual Activity  . Alcohol use: No  . Drug use: No  . Sexual activity: Not Currently  Other Topics Concern  . 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      Has two children and two grandchildren.      Does not have a living will.  Desires CPR, would not want prolonged life support if futile.   Social Determinants of Health   Financial Resource Strain: Low Risk   . Difficulty of Paying Living Expenses: Not hard at all  Food Insecurity: No Food Insecurity  . Worried About Charity fundraiser in the Last Year: Never true  . Ran Out of Food in the Last Year: Never true  Transportation Needs: No Transportation Needs  . Lack of Transportation (Medical): No  . Lack of Transportation (Non-Medical): No  Physical Activity: Insufficiently Active  . Days of Exercise per Week: 3 days  . Minutes of Exercise per Session: 30 min  Stress: No Stress Concern Present  . Feeling of Stress : Not at all  Social Connections: Not on file  Intimate Partner Violence: Not At Risk  . Fear of Current or Ex-Partner: No  . Emotionally Abused: No  . Physically Abused: No  . Sexually Abused: No  Past Surgical History:  Procedure Laterality Date  . APPENDECTOMY    . BREAST BIOPSY    . TUBAL LIGATION      Family History  Problem Relation Age of Onset  . Diabetes Father   . Hypertension Father   . Hyperlipidemia Father   . Stroke Father   . Diabetes Sister   . Hyperlipidemia Sister   . Hypertension Sister   . Lung cancer Paternal Uncle   . Diabetes Paternal Grandmother   . Heart disease Paternal Grandfather     No Known Allergies  Current Outpatient Medications on File Prior to Visit  Medication Sig Dispense Refill  . acetaminophen (TYLENOL) 500 MG tablet Take 1,000 mg by mouth every 6 (six) hours as needed for mild pain.    Marland Kitchen amLODipine (NORVASC) 10 MG tablet Take 1 tablet (10 mg total) by mouth daily. For  blood pressure. 90 tablet 1  . aspirin EC 81 MG tablet Take 81 mg by mouth daily.    . Blood Glucose Monitoring Suppl (ONE TOUCH ULTRA 2) w/Device KIT Use as instructed to test blood sugar daily. Dx is E11.9 1 each 0  . carvedilol (COREG) 6.25 MG tablet Take 1 tablet (6.25 mg total) by mouth 2 (two) times daily with a meal. For blood pressure. 180 tablet 3  . dapagliflozin propanediol (FARXIGA) 5 MG TABS tablet Take 5 mg by mouth daily. For diabetes. 90 tablet 3  . famotidine (PEPCID) 20 MG tablet Take 1 tablet (20 mg total) by mouth 2 (two) times daily. For heartburn. 180 tablet 1  . gabapentin (NEURONTIN) 100 MG capsule Take 1 to 2 capsules by mouth once or twice daily for neck pain. 60 capsule 0  . glucose blood (ONE TOUCH ULTRA TEST) test strip OneTouch Use as instructed to test blood sugar twice daily. Dx is E11.9 100 each 5  . losartan (COZAAR) 100 MG tablet Take 1 tablet (100 mg total) by mouth daily. For blood pressure. 90 tablet 3  . ONE TOUCH LANCETS MISC Use as instructed to test blood sugar daily. Dx is E11.9 200 each 2  . rosuvastatin (CRESTOR) 20 MG tablet Take 1 tablet (20 mg total) by mouth at bedtime. For cholesterol. 90 tablet 3  . sitaGLIPtin-metformin (JANUMET) 50-1000 MG tablet Take 1 tablet by mouth 2 (two) times daily with a meal. For diabetes. 180 tablet 3   No current facility-administered medications on file prior to visit.    There were no vitals taken for this visit. Objective:   Physical Exam Cardiovascular:     Rate and Rhythm: Normal rate and regular rhythm.  Pulmonary:     Effort: Pulmonary effort is normal.     Breath sounds: Normal breath sounds.  Musculoskeletal:     Cervical back: Neck supple.  Skin:    General: Skin is warm and dry.           Assessment & Plan:      This visit occurred during the SARS-CoV-2 public health emergency.  Safety protocols were in place, including screening questions prior to the visit, additional usage of staff  PPE, and extensive cleaning of exam room while observing appropriate contact time as indicated for disinfecting solutions.

## 2021-05-05 NOTE — Assessment & Plan Note (Signed)
Managed on Crestor 20 mg which could be causing myalgias.  Will have her hold Crestor 20 mg for 2 weeks and update. Repeat lipid panel pending. Consider changing to pravastatin 40 mg if needed.

## 2021-05-05 NOTE — Assessment & Plan Note (Signed)
Glucose readings appear under great control. Repeat A1C pending today.  If A1C below 7 then would discontinue Farxiga 5 mg, continue sitagliptan-metformin 50/1000 mg.

## 2021-05-05 NOTE — Patient Instructions (Addendum)
Stop taking amlodipine 10 mg for blood pressure.  Hold your rosuvastatin (Crestor) 20 mg cholesterol medication for two weeks. This may be causing your cramps.  Stop by the lab prior to leaving today. I will notify you of your results once received.   We may end up stopping your [place], but I'll be in touch regarding this.  Please schedule a follow up appointment in 6 months.   It was a pleasure to see you today!

## 2021-05-13 ENCOUNTER — Ambulatory Visit (INDEPENDENT_AMBULATORY_CARE_PROVIDER_SITE_OTHER): Payer: Medicare Other

## 2021-05-13 ENCOUNTER — Other Ambulatory Visit: Payer: Self-pay

## 2021-05-13 DIAGNOSIS — Z0000 Encounter for general adult medical examination without abnormal findings: Secondary | ICD-10-CM

## 2021-05-13 NOTE — Patient Instructions (Signed)
Brenda Contreras , Thank you for taking time to come for your Medicare Wellness Visit. I appreciate your ongoing commitment to your health goals. Please review the following plan we discussed and let me know if I can assist you in the future.   Screening recommendations/referrals: Colonoscopy: Up to date, completed 06/12/2018, due 05/2025 Mammogram: Up to date, completed 02/2021, due 02/2022 Bone Density: Up to date, completed 07/13/2019, due 2-5 years  Recommended yearly ophthalmology/optometry visit for glaucoma screening and checkup Recommended yearly dental visit for hygiene and checkup  Vaccinations: Influenza vaccine: Up to date, completed 10/22/2020, due 07/2021 Pneumococcal vaccine: Completed series Tdap vaccine: Up to date, completed 04/26/2016, due 04/2026 Shingles vaccine: due, check with your insurance regarding coverage if interested    Covid-19:2 doses completed, declined booster   Advanced directives: Advance directive discussed with you today. Even though you declined this today please call our office should you change your mind and we can give you the proper paperwork for you to fill out.  Conditions/risks identified: diabetes, hypertension, hyperlipidemia   Next appointment: Follow up in one year for your annual wellness visit    Preventive Care 71 Years and Older, Female Preventive care refers to lifestyle choices and visits with your health care provider that can promote health and wellness. What does preventive care include?  A yearly physical exam. This is also called an annual well check.  Dental exams once or twice a year.  Routine eye exams. Ask your health care provider how often you should have your eyes checked.  Personal lifestyle choices, including:  Daily care of your teeth and gums.  Regular physical activity.  Eating a healthy diet.  Avoiding tobacco and drug use.  Limiting alcohol use.  Practicing safe sex.  Taking low-dose aspirin every  day.  Taking vitamin and mineral supplements as recommended by your health care provider. What happens during an annual well check? The services and screenings done by your health care provider during your annual well check will depend on your age, overall health, lifestyle risk factors, and family history of disease. Counseling  Your health care provider may ask you questions about your:  Alcohol use.  Tobacco use.  Drug use.  Emotional well-being.  Home and relationship well-being.  Sexual activity.  Eating habits.  History of falls.  Memory and ability to understand (cognition).  Work and work Statistician.  Reproductive health. Screening  You may have the following tests or measurements:  Height, weight, and BMI.  Blood pressure.  Lipid and cholesterol levels. These may be checked every 5 years, or more frequently if you are over 71 years old.  Skin check.  Lung cancer screening. You may have this screening every year starting at age 71 if you have a 30-pack-year history of smoking and currently smoke or have quit within the past 15 years.  Fecal occult blood test (FOBT) of the stool. You may have this test every year starting at age 71.  Flexible sigmoidoscopy or colonoscopy. You may have a sigmoidoscopy every 5 years or a colonoscopy every 10 years starting at age 71.  Hepatitis C blood test.  Hepatitis B blood test.  Sexually transmitted disease (STD) testing.  Diabetes screening. This is done by checking your blood sugar (glucose) after you have not eaten for a while (fasting). You may have this done every 1-3 years.  Bone density scan. This is done to screen for osteoporosis. You may have this done starting at age 71.  Mammogram. This may  be done every 1-2 years. Talk to your health care provider about how often you should have regular mammograms. Talk with your health care provider about your test results, treatment options, and if necessary, the need  for more tests. Vaccines  Your health care provider may recommend certain vaccines, such as:  Influenza vaccine. This is recommended every year.  Tetanus, diphtheria, and acellular pertussis (Tdap, Td) vaccine. You may need a Td booster every 10 years.  Zoster vaccine. You may need this after age 71.  Pneumococcal 13-valent conjugate (PCV13) vaccine. One dose is recommended after age 71.  Pneumococcal polysaccharide (PPSV23) vaccine. One dose is recommended after age 71. Talk to your health care provider about which screenings and vaccines you need and how often you need them. This information is not intended to replace advice given to you by your health care provider. Make sure you discuss any questions you have with your health care provider. Document Released: 01/09/2016 Document Revised: 09/01/2016 Document Reviewed: 10/14/2015 Elsevier Interactive Patient Education  2017 [place] Prevention in the Home Falls can cause injuries. They can happen to people of all ages. There are many things you can do to make your home safe and to help prevent falls. What can I do on the outside of my home?  Regularly fix the edges of walkways and driveways and fix any cracks.  Remove anything that might make you trip as you walk through a door, such as a raised step or threshold.  Trim any bushes or trees on the path to your home.  Use bright outdoor lighting.  Clear any walking paths of anything that might make someone trip, such as rocks or tools.  Regularly check to see if handrails are loose or broken. Make sure that both sides of any steps have handrails.  Any raised decks and porches should have guardrails on the edges.  Have any leaves, snow, or ice cleared regularly.  Use sand or salt on walking paths during winter.  Clean up any spills in your garage right away. This includes oil or grease spills. What can I do in the bathroom?  Use night lights.  Install grab bars  by the toilet and in the tub and shower. Do not use towel bars as grab bars.  Use non-skid mats or decals in the tub or shower.  If you need to sit down in the shower, use a plastic, non-slip stool.  Keep the floor dry. Clean up any water that spills on the floor as soon as it happens.  Remove soap buildup in the tub or shower regularly.  Attach bath mats securely with double-sided non-slip rug tape.  Do not have throw rugs and other things on the floor that can make you trip. What can I do in the bedroom?  Use night lights.  Make sure that you have a light by your bed that is easy to reach.  Do not use any sheets or blankets that are too big for your bed. They should not hang down onto the floor.  Have a firm chair that has side arms. You can use this for support while you get dressed.  Do not have throw rugs and other things on the floor that can make you trip. What can I do in the kitchen?  Clean up any spills right away.  Avoid walking on wet floors.  Keep items that you use a lot in easy-to-reach places.  If you need to reach something above you,  use a strong step stool that has a grab bar.  Keep electrical cords out of the way.  Do not use floor polish or wax that makes floors slippery. If you must use wax, use non-skid floor wax.  Do not have throw rugs and other things on the floor that can make you trip. What can I do with my stairs?  Do not leave any items on the stairs.  Make sure that there are handrails on both sides of the stairs and use them. Fix handrails that are broken or loose. Make sure that handrails are as long as the stairways.  Check any carpeting to make sure that it is firmly attached to the stairs. Fix any carpet that is loose or worn.  Avoid having throw rugs at the top or bottom of the stairs. If you do have throw rugs, attach them to the floor with carpet tape.  Make sure that you have a light switch at the top of the stairs and the  bottom of the stairs. If you do not have them, ask someone to add them for you. What else can I do to help prevent falls?  Wear shoes that:  Do not have high heels.  Have rubber bottoms.  Are comfortable and fit you well.  Are closed at the toe. Do not wear sandals.  If you use a stepladder:  Make sure that it is fully opened. Do not climb a closed stepladder.  Make sure that both sides of the stepladder are locked into place.  Ask someone to hold it for you, if possible.  Clearly mark and make sure that you can see:  Any grab bars or handrails.  First and last steps.  Where the edge of each step is.  Use tools that help you move around (mobility aids) if they are needed. These include:  Canes.  Walkers.  Scooters.  Crutches.  Turn on the lights when you go into a dark area. Replace any light bulbs as soon as they burn out.  Set up your furniture so you have a clear path. Avoid moving your furniture around.  If any of your floors are uneven, fix them.  If there are any pets around you, be aware of where they are.  Review your medicines with your doctor. Some medicines can make you feel dizzy. This can increase your chance of falling. Ask your doctor what other things that you can do to help prevent falls. This information is not intended to replace advice given to you by your health care provider. Make sure you discuss any questions you have with your health care provider. Document Released: 10/09/2009 Document Revised: 05/20/2016 Document Reviewed: 01/17/2015 Elsevier Interactive Patient Education  2017 [employer].

## 2021-05-13 NOTE — Progress Notes (Signed)
PCP notes:  Health Maintenance: Covid booster- declined   Abnormal Screenings: none   Patient concerns: none   Nurse concerns: none   Next PCP appt.: 11/05/2021 @ 2 pm

## 2021-05-13 NOTE — Progress Notes (Addendum)
Subjective:   Brenda Contreras is a 70 y.o. female who presents for Medicare Annual (Subsequent) preventive examination.  Review of Systems: N/A      I connected with the patient today by telephone and verified that I am speaking with the correct person using two identifiers. Location patient: home Location nurse: work Persons participating in the telephone visit: patient, nurse.   I discussed the limitations, risks, security and privacy concerns of performing an evaluation and management service by telephone and the availability of in person appointments. I also discussed with the patient that there may be a patient responsible charge related to this service. The patient expressed understanding and verbally consented to this telephonic visit.        Cardiac Risk Factors include: advanced age (>81mn, >>5women);diabetes mellitus;hypertension;Other (see comment), Risk factor comments: hyperlipidemia     Objective:    Today's Vitals   There is no height or weight on file to calculate BMI.  Advanced Directives 05/13/2021 05/12/2020 05/09/2019 05/04/2018 01/28/2018  Does Patient Have a Medical Advance Directive? Yes Yes Yes No No  Type of AParamedicof AAllenLiving will HAveryLiving will HFaywoodLiving will - -  Copy of HCl[place]in Chart? No - copy requested No - copy requested No - copy requested - -  Would patient like information on creating a medical advance directive? - - - Yes (MAU/Ambulatory/Procedural Areas - Information given) Yes (ED - Information included in AVS)    Current Medications (verified) Outpatient Encounter Medications as of 05/13/2021  Medication Sig  . acetaminophen (TYLENOL) 500 MG tablet Take 1,000 mg by mouth every 6 (six) hours as needed for mild pain.  .Marland Kitchenaspirin EC 81 MG tablet Take 81 mg by mouth daily.  . Blood Glucose Monitoring Suppl (ONE TOUCH ULTRA 2) w/Device KIT  Use as instructed to test blood sugar daily. Dx is E11.9  . carvedilol (COREG) 6.25 MG tablet Take 1 tablet (6.25 mg total) by mouth 2 (two) times daily with a meal. For blood pressure.  . dapagliflozin propanediol (FARXIGA) 5 MG TABS tablet Take 5 mg by mouth daily. For diabetes.  . famotidine (PEPCID) 20 MG tablet Take 1 tablet (20 mg total) by mouth 2 (two) times daily. For heartburn.  . gabapentin (NEURONTIN) 100 MG capsule Take 1 to 2 capsules by mouth once or twice daily for neck pain.  .Marland Kitchenglucose blood (ONE TOUCH ULTRA TEST) test strip OneTouch Use as instructed to test blood sugar twice daily. Dx is E11.9  . losartan (COZAAR) 100 MG tablet Take 1 tablet (100 mg total) by mouth daily. For blood pressure.  . ONE TOUCH LANCETS MISC Use as instructed to test blood sugar daily. Dx is E11.9  . rosuvastatin (CRESTOR) 20 MG tablet Take 1 tablet (20 mg total) by mouth at bedtime. For cholesterol.  . sitaGLIPtin-metformin (JANUMET) 50-1000 MG tablet Take 1 tablet by mouth 2 (two) times daily with a meal. For diabetes.   No facility-administered encounter medications on file as of 05/13/2021.    Allergies (verified) Patient has no known allergies.   History: Past Medical History:  Diagnosis Date  . Acute diverticulitis 05/08/2020  . Diabetes mellitus   . 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 Dizziness   . GERD (gastroesophageal reflux disease)   . High cholesterol   . Hypertension   . Vaginal dryness    Past Surgical History:  Procedure  Laterality Date  . APPENDECTOMY    . BREAST BIOPSY    . TUBAL LIGATION     Family History  Problem Relation Age of Onset  . Diabetes Father   . Hypertension Father   . Hyperlipidemia Father   . Stroke Father   . Diabetes Sister   . Hyperlipidemia Sister   . Hypertension Sister   . Lung cancer Paternal Uncle   . Diabetes Paternal Grandmother   . Heart disease Paternal Grandfather    Social History   Socioeconomic History  .  Marital status: Widowed    Spouse name: Not on file  . Number of children: Not on file  . Years of education: Not on file  . Highest education level: Not on file  Occupational History  . Not on file  Tobacco Use  . Smoking status: Never Smoker  . Smokeless tobacco: Never Used  Vaping Use  . Vaping Use: Never used  Substance and Sexual Activity  . Alcohol use: No  . Drug use: No  . Sexual activity: Not Currently  Other Topics Concern  . 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      Has two children and two grandchildren.      Does not have a living will.  Desires CPR, would not want prolonged life support if futile.   Social Determinants of Health   Financial Resource Strain: Low Risk   . Difficulty of Paying Living Expenses: Not hard at all  Food Insecurity: No Food Insecurity  . Worried About Charity fundraiser in the Last Year: Never true  . Ran Out of Food in the Last Year: Never true  Transportation Needs: No Transportation Needs  . Lack of Transportation (Medical): No  . Lack of Transportation (Non-Medical): No  Physical Activity: Sufficiently Active  . Days of Exercise per Week: 7 days  . Minutes of Exercise per Session: 40 min  Stress: No Stress Concern Present  . Feeling of Stress : Not at all  Social Connections: Not on file    Tobacco Counseling Counseling given: Not Answered   Clinical Intake:  Pre-visit preparation completed: Yes  Pain : No/denies pain     Nutritional Risks: None Diabetes: Yes CBG done?: No Did pt. bring in CBG monitor from home?: No  How often do you need to have someone help you when you read instructions, pamphlets, or other written materials from your doctor or pharmacy?: 1 - Never What is the last grade level you completed in school?: 12th  Diabetic: Yes Nutrition Risk Assessment:  Has the patient had any N/V/D within the last 2 months?  No  Does the patient have any non-healing  wounds?  No  Has the patient had any unintentional weight loss or weight gain?  No   Diabetes:  Is the patient diabetic?  Yes  If diabetic, was a CBG obtained today?  No  telephone visit  Did the patient bring in their glucometer from home?  No  telephone visit  How often do you monitor your CBG's? daily.   Financial Strains and Diabetes Management:  Are you having any financial strains with the device, your supplies or your medication? No .  Does the patient want to be seen by Chronic Care Management for management of their diabetes?  No  Would the patient like to be referred to a Nutritionist or for Diabetic Management?  No   Diabetic Exams:  Diabetic  Eye Exam: Completed 06/04/2020 Diabetic Foot Exam: Completed 05/15/2020   Interpreter Needed?: No  Information entered by :: CJohnson, LPN   Activities of Daily Living In your present state of health, do you have any difficulty performing the following activities: 05/13/2021  Hearing? N  Vision? N  Difficulty concentrating or making decisions? N  Walking or climbing stairs? N  Dressing or bathing? N  Doing errands, shopping? N  Preparing Food and eating ? N  Using the Toilet? N  In the past six months, have you accidently leaked urine? N  Do you have problems with loss of bowel control? N  Managing your Medications? N  Managing your Finances? N  Housekeeping or managing your Housekeeping? N  Some recent data might be hidden    Patient Care Team: Pleas Koch, NP as PCP - General (Internal Medicine) Adrian Prows, MD as Consulting Physician (Cardiology) Marylynn Pearson, MD as Consulting Physician (Obstetrics and Gynecology) Warden Fillers, MD as Consulting Physician (Ophthalmology)  Indicate any recent Medical Services you may have received from other than [facility] providers in the past year (date may be approximate).     Assessment:   This is a routine wellness examination for Shyanna.  Hearing/Vision screen   Hearing Screening   125Hz 250Hz 500Hz 1000Hz 2000Hz 3000Hz 4000Hz 6000Hz 8000Hz  Right ear:           Left ear:           Vision Screening Comments: Patient gets annual eye exams   Dietary issues and exercise activities discussed: Current Exercise Habits: Structured exercise class, Type of exercise: strength training/weights, Time (Minutes): 45, Frequency (Times/Week): 7, Weekly Exercise (Minutes/Week): 315, Intensity: Moderate, Exercise limited by: None identified  Goals Addressed            This Visit's Progress   . Patient Stated       05/13/2021, I will continue going to [employer] doing weights and cardio 7 days a week for 30-45 minutes.       Depression Screen PHQ 2/9 Scores 05/13/2021 05/05/2021 10/21/2020 05/12/2020 05/11/2019 05/09/2019 05/04/2018  PHQ - 2 Score 0 0 0 0 0 3 0  PHQ- 9 Score 0 0 3 0 0 11 0    Fall Risk Fall Risk  05/13/2021 05/12/2020 05/09/2019 05/04/2018 04/25/2017  Falls in the past year? 0 0 0 No No  Number falls in past yr: 0 0 - - -  Injury with Fall? 0 0 - - -  Risk for fall due to : Medication side effect Medication side effect - - -  Follow up Falls evaluation completed;Falls prevention discussed Falls evaluation completed;Falls prevention discussed - - -    FALL RISK PREVENTION PERTAINING TO THE HOME:  Any stairs in or around the home? Yes  If so, are there any without handrails? No  Home free of loose throw rugs in walkways, pet beds, electrical cords, etc? Yes  Adequate lighting in your home to reduce risk of falls? Yes   ASSISTIVE DEVICES UTILIZED TO PREVENT FALLS:  Life alert? No  Use of a cane, walker or w/c? No  Grab bars in the bathroom? No  Shower chair or bench in shower? No  Elevated toilet seat or a handicapped toilet? No   TIMED UP AND GO:  Was the test performed? N/A telephone visit .    Cognitive Function: MMSE - Mini Mental State Exam 05/13/2021 05/12/2020 05/09/2019 05/04/2018  Orientation to time _0 Orientation to  Place _0 Registration _1 Attention/ Calculation 5 5 0 0  Recall _2 Language- name 2 objects - - 0 0  Language- repeat _3 Language- follow 3 step command - - 0 3  Language- read & follow direction - - 0 0  Write a sentence - - 0 0  Copy design - - 0 0  Total score - - 17 20  Mini Cog  Mini-Cog screen was completed. Maximum score is 22. A value of 0 denotes this part of the MMSE was not completed or the patient failed this part of the Mini-Cog screening.       Immunizations Immunization History  Administered Date(s) Administered  . Fluad Quad(high Dose 65+) 09/18/2019, 10/22/2020  . Influenza,inj,Quad PF,6+ Mos 09/30/2016, 09/15/2017, 02/08/2019  . Influenza-Unspecified 07/27/2013, 02/08/2019  . Moderna Sars-Covid-2 Vaccination 08/25/2020, 09/21/2020  . Pneumococcal Conjugate-13 04/20/2016  . Pneumococcal Polysaccharide-23 04/25/2017  . Pneumococcal-Unspecified 07/27/2013  . Tdap 03/27/2012, 04/26/2016  . Zoster 07/12/2016    TDAP status: Up to date  Flu Vaccine status: Up to date  Pneumococcal vaccine status: Up to date  Covid-19 vaccine status: completed 2 doses, declined booster.   Qualifies for Shingles Vaccine? Yes   Zostavax completed Yes   Shingrix Completed?: No.    Education has been provided regarding the importance of this vaccine. Patient has been advised to call insurance company to determine out of pocket expense if they have not yet received this vaccine. Advised may also receive vaccine at local pharmacy or Health Dept. Verbalized acceptance and understanding.  Screening Tests Health Maintenance  Topic Date Due  . COVID-19 Vaccine (3 - Booster for Moderna series) 05/29/2021 (Originally 02/21/2021)  . FOOT EXAM  05/15/2021  . OPHTHALMOLOGY EXAM  06/04/2021  . INFLUENZA VACCINE  07/27/2021  . HEMOGLOBIN A1C  11/05/2021  . MAMMOGRAM  02/25/2023  . COLONOSCOPY (Pts 45-83yr Insurance coverage will need to be confirmed)  06/12/2025   . TETANUS/TDAP  04/26/2026  . DEXA SCAN  Completed  . Hepatitis C Screening  Completed  . PNA vac Low Risk Adult  Completed  . HPV VACCINES  Aged Out    Health Maintenance  There are no preventive care reminders to display for this patient.  Colorectal cancer screening: Type of screening: Colonoscopy. Completed 06/12/2018. Repeat every 7 years  Mammogram status: Completed 02/24/2021. Repeat every year  Bone Density status: Completed 07/13/2019. Results reflect: Bone density results: NORMAL. Repeat every 2-5 years.  Lung Cancer Screening: (Low Dose CT Chest recommended if Age 71-80years, 30 pack-year currently smoking OR have quit w/in 15years.) does not qualify.  Additional Screening:  Hepatitis C Screening: does qualify; Completed 04/20/2016  Vision Screening: Recommended annual ophthalmology exams for early detection of glaucoma and other disorders of the eye. Is the patient up to date with their annual eye exam?  Yes  Who is the provider or what is the name of the office in which the patient attends annual eye exams? Dr. GKaty FitchIf pt is not established with a provider, would they like to be referred to a provider to establish care? No .   Dental Screening: Recommended annual dental exams for proper oral hygiene  Community Resource Referral / Chronic Care Management: CRR required this visit?  No   CCM required this visit?  No      Plan:     I have personally reviewed and noted the following in the  patient's chart:   . Medical and social history . Use of alcohol, tobacco or illicit drugs  . Current medications and supplements including opioid prescriptions.  . Functional ability and status . Nutritional status . Physical activity . Advanced directives . List of other physicians . Hospitalizations, surgeries, and ER visits in previous 12 months . Vitals . Screenings to include cognitive, depression, and falls . Referrals and appointments  In addition, I have  reviewed and discussed with patient certain preventive protocols, quality metrics, and best practice recommendations. A written personalized care plan for preventive services as well as general preventive health recommendations were provided to patient.   Due to this being a telephonic visit, the after visit summary with patients personalized plan was offered to patient via office or my-chart. Patient preferred to pick up at office at next visit or via mychart.   Andrez Grime, LPN   01/26/8656

## 2021-05-21 ENCOUNTER — Other Ambulatory Visit: Payer: Self-pay

## 2021-05-21 ENCOUNTER — Other Ambulatory Visit (INDEPENDENT_AMBULATORY_CARE_PROVIDER_SITE_OTHER): Payer: Medicare Other

## 2021-05-21 DIAGNOSIS — N289 Disorder of kidney and ureter, unspecified: Secondary | ICD-10-CM | POA: Diagnosis not present

## 2021-05-21 LAB — BASIC METABOLIC PANEL
BUN: 19 mg/dL (ref 6–23)
CO2: 23 mEq/L (ref 19–32)
Calcium: 10.1 mg/dL (ref 8.4–10.5)
Chloride: 106 mEq/L (ref 96–112)
Creatinine, Ser: 1.4 mg/dL — ABNORMAL HIGH (ref 0.40–1.20)
GFR: 37.98 mL/min — ABNORMAL LOW (ref >60.00)
Glucose, Bld: 126 mg/dL — ABNORMAL HIGH (ref 70–99)
Potassium: 4 mEq/L (ref 3.5–5.1)
Sodium: 137 mEq/L (ref 135–145)

## 2021-05-27 ENCOUNTER — Encounter: Payer: Self-pay | Admitting: Family Medicine

## 2021-05-27 ENCOUNTER — Ambulatory Visit (INDEPENDENT_AMBULATORY_CARE_PROVIDER_SITE_OTHER)
Admission: RE | Admit: 2021-05-27 | Discharge: 2021-05-27 | Disposition: A | Discharge status: Home / Self Care | Payer: Medicare Other | Source: Ambulatory Visit | Attending: Family Medicine | Admitting: Family Medicine

## 2021-05-27 ENCOUNTER — Other Ambulatory Visit: Payer: Self-pay

## 2021-05-27 ENCOUNTER — Ambulatory Visit (INDEPENDENT_AMBULATORY_CARE_PROVIDER_SITE_OTHER): Payer: Medicare Other | Admitting: Family Medicine

## 2021-05-27 VITALS — BP 136/70 | HR 66 | Temp 96.9°F | Ht 63.0 in | Wt 138.1 lb

## 2021-05-27 DIAGNOSIS — M25551 Pain in right hip: Secondary | ICD-10-CM

## 2021-05-27 DIAGNOSIS — R202 Paresthesia of skin: Secondary | ICD-10-CM

## 2021-05-27 DIAGNOSIS — M545 Low back pain, unspecified: Secondary | ICD-10-CM | POA: Diagnosis not present

## 2021-05-27 HISTORY — DX: Pain in right hip: M25.551

## 2021-05-27 NOTE — Progress Notes (Signed)
Subjective:    Patient ID: Brenda Contreras, female    DOB: 1951-03-15, 70 y.o.   MRN: [id number]  This visit occurred during the SARS-CoV-2 public health emergency.  Safety protocols were in place, including screening questions prior to the visit, additional usage of staff PPE, and extensive cleaning of exam room while observing appropriate contact time as indicated for disinfecting solutions.    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 of NP Clark presents with burning sensation in R leg  Wt Readings from Last 3 Encounters:  05/27/21 138 lb 2 oz (62.7 kg)  05/05/21 141 lb 12.8 oz (64.3 kg)  11/17/20 162 lb (73.5 kg)   24.47 kg/m  Burning sensation on lateral R lower leg  Keeps her up at night  When she first stands, it hurts to bear weight  Then once she gets moving -after 15-20 minutes it goes away almost (not totally)  Sometimes the burning will will radiate up to the hip  Not to the foot   No numbness  No weakness No recent or old trauma that she knows of   She works out at [employer] In a class with weights/strength and some cardio (stepping machine)   She has some pain in R buttock/ SI area (dull ache, this is different)  No pain in groin   She took some ES excedrin   Patient Active Problem List   Diagnosis Date Noted  . Paresthesia of right leg 05/27/2021  . Right hip pain 05/27/2021  . Chronic neck pain 10/21/2020  . Fatigue 10/21/2020  . Lower abdominal pain 05/30/2020  . Preventative health care 05/15/2020  . Acquired cyst of kidney 05/08/2020  . Stage 3a chronic kidney disease ([place]) 11/28/2019  . Chest pain 02/21/2018  . Encounter for diabetic foot exam ([place]) 05/05/2017  . Medicare annual wellness visit, initial 04/25/2017  . HTN (hypertension) 01/13/2016  . GERD (gastroesophageal reflux disease) 01/13/2016  . Diabetes ([place]) 01/13/2016  . HLD (hyperlipidemia) 01/13/2016   Past Medical History:  Diagnosis Date  . Acute diverticulitis 05/08/2020  . Diabetes mellitus   .  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 Dizziness   . GERD (gastroesophageal reflux disease)   . High cholesterol   . Hypertension   . Vaginal dryness    Past Surgical History:  Procedure Laterality Date  . APPENDECTOMY    . BREAST BIOPSY    . TUBAL LIGATION     Social History   Tobacco Use  . Smoking status: Never Smoker  . Smokeless tobacco: Never Used  Vaping Use  . Vaping Use: Never used  Substance Use Topics  . Alcohol use: No  . Drug use: No   Family History  Problem Relation Age of Onset  . Diabetes Father   . Hypertension Father   . Hyperlipidemia Father   . Stroke Father   . Diabetes Sister   . Hyperlipidemia Sister   . Hypertension Sister   . Lung cancer Paternal Uncle   . Diabetes Paternal Grandmother   . Heart disease Paternal Grandfather    No Known Allergies Current Outpatient Medications on File Prior to Visit  Medication Sig Dispense Refill  . acetaminophen (TYLENOL) 500 MG tablet Take 1,000 mg by mouth every 6 (six) hours as needed for mild pain.    Marland Kitchen aspirin EC 81 MG tablet Take 81 mg by mouth daily.    . Blood Glucose Monitoring Suppl (ONE TOUCH ULTRA 2) w/Device KIT Use as instructed  to test blood sugar daily. Dx is E11.9 1 each 0  . carvedilol (COREG) 6.25 MG tablet Take 1 tablet (6.25 mg total) by mouth 2 (two) times daily with a meal. For blood pressure. 180 tablet 3  . dapagliflozin propanediol (FARXIGA) 5 MG TABS tablet Take 5 mg by mouth daily. For diabetes. 90 tablet 3  . famotidine (PEPCID) 20 MG tablet Take 1 tablet (20 mg total) by mouth 2 (two) times daily. For heartburn. 180 tablet 1  . gabapentin (NEURONTIN) 100 MG capsule Take 1 to 2 capsules by mouth once or twice daily for neck pain. 60 capsule 0  . glucose blood (ONE TOUCH ULTRA TEST) test strip OneTouch Use as instructed to test blood sugar twice daily. Dx is E11.9 100 each 5  . losartan (COZAAR) 100 MG tablet Take 1 tablet (100 mg total) by mouth daily. For blood pressure.  90 tablet 3  . ONE TOUCH LANCETS MISC Use as instructed to test blood sugar daily. Dx is E11.9 200 each 2  . sitaGLIPtin-metformin (JANUMET) 50-1000 MG tablet Take 1 tablet by mouth 2 (two) times daily with a meal. For diabetes. 180 tablet 3  . rosuvastatin (CRESTOR) 20 MG tablet Take 1 tablet (20 mg total) by mouth at bedtime. For cholesterol. 90 tablet 3   No current facility-administered medications on file prior to visit.     Review of Systems  Constitutional: Negative for activity change, appetite change, fatigue, fever and unexpected weight change.  HENT: Negative for congestion, ear pain, rhinorrhea, sinus pressure and sore throat.   Eyes: Negative for pain, redness and visual disturbance.  Respiratory: Negative for cough, shortness of breath and wheezing.   Cardiovascular: Negative for chest pain and palpitations.  Gastrointestinal: Negative for abdominal pain, blood in stool, constipation and diarrhea.  Endocrine: Negative for polydipsia and polyuria.  Genitourinary: Negative for dysuria, frequency and urgency.  Musculoskeletal: Positive for arthralgias. Negative for back pain and myalgias.  Skin: Negative for pallor and rash.  Allergic/Immunologic: Negative for environmental allergies.  Neurological: Negative for dizziness, tremors, syncope, facial asymmetry, weakness, numbness and headaches.       Tingling of R lower leg w/o numbness  Hematological: Negative for adenopathy. Does not bruise/bleed easily.  Psychiatric/Behavioral: Negative for decreased concentration and dysphoric mood. The patient is not nervous/anxious.        Objective:   Physical Exam Constitutional:      General: She is not in acute distress.    Appearance: Normal appearance. She is well-developed and normal weight. She is not ill-appearing.  HENT:     Head: Normocephalic and atraumatic.  Eyes:     General: No scleral icterus.    Conjunctiva/sclera: Conjunctivae normal.     Pupils: Pupils are equal,  round, and reactive to light.  Cardiovascular:     Rate and Rhythm: Normal rate and regular rhythm.  Pulmonary:     Effort: Pulmonary effort is normal.     Breath sounds: Normal breath sounds. No wheezing or rales.  Abdominal:     General: Bowel sounds are normal. There is no distension.     Palpations: Abdomen is soft.     Tenderness: There is no abdominal tenderness.  Musculoskeletal:        General: Tenderness present.     Cervical back: Normal range of motion and neck supple.     Lumbar back: No edema, spasms, tenderness or bony tenderness. Normal range of motion.     Right lower leg: No edema.  Left lower leg: No edema.     Comments: LS- some loss of lordosis , some discomfort with 90 deg bend (nl ext and lateral bend)  Nl rom of hips Some tenderness in R SI/ piriformis area  Some pain with piriformis stretch (hip flex with hip est rot) Nl gait  Slightly tender over R greater trochanter  Lymphadenopathy:     Cervical: No cervical adenopathy.  Skin:    General: Skin is warm and dry.     Coloration: Skin is not pale.     Findings: No erythema or rash.  Neurological:     Mental Status: She is alert.     Cranial Nerves: No cranial nerve deficit.     Sensory: No sensory deficit.     Motor: Motor function is intact. No weakness, tremor, atrophy or abnormal muscle tone.     Coordination: Coordination is intact. Coordination normal.     Gait: Gait is intact.     Deep Tendon Reflexes: Reflexes are normal and symmetric.     Comments: Negative SLR  Dec sensation to light touch medial RLE             Assessment & Plan:   Problem List Items Addressed This Visit      Other   Paresthesia of right leg - Primary    Suspect this is radicular since area itself is non tender  Reassuring exam  Some R SI and trochanteric tenderness Adv trial of voltaren gel (avoiding oral nsaids)  Heat/ice Given handout for piriformis rehab to try  LS and hip films done today, pending  report  Update if not starting to improve in a week or if worsening        Relevant Orders   DG Lumbar Spine Complete (Completed)   DG Hip Unilat W OR W/O Pelvis 2-3 Views Right (Completed)   Right hip pain    SI and piriformis area  Slight over greater troch Given piriformis rehab handout and reviewed  Adv heat/ice voltaren gel prn  Consider new mattress Xray of LS and hip today-report pending       Relevant Orders   DG Lumbar Spine Complete (Completed)   DG Hip Unilat W OR W/O Pelvis 2-3 Views Right (Completed)

## 2021-05-27 NOTE — Patient Instructions (Addendum)
Take tylenol for pain as needed   You can try voltaren gel on the areas that hurt several times per day  Also ice on the hip/buttock area for 10 minutes at a time   Xray now   Try the piriformis rehab on the handout

## 2021-05-27 NOTE — Assessment & Plan Note (Signed)
Suspect this is radicular since area itself is non tender  Reassuring exam  Some R SI and trochanteric tenderness Adv trial of voltaren gel (avoiding oral nsaids)  Heat/ice Given handout for piriformis rehab to try  LS and hip films done today, pending report  Update if not starting to improve in a week or if worsening

## 2021-05-27 NOTE — Assessment & Plan Note (Signed)
SI and piriformis area  Slight over greater troch Given piriformis rehab handout and reviewed  Adv heat/ice voltaren gel prn  Consider new mattress Xray of LS and hip today-report pending

## 2021-05-29 ENCOUNTER — Telehealth: Payer: Self-pay | Admitting: Family Medicine

## 2021-05-29 DIAGNOSIS — R202 Paresthesia of skin: Secondary | ICD-10-CM

## 2021-05-29 DIAGNOSIS — M25551 Pain in right hip: Secondary | ICD-10-CM

## 2021-05-29 NOTE — Telephone Encounter (Signed)
-----   Message from Tammi Sou, [place] sent at 05/28/2021  4:48 PM EDT ----- Pt notified of Dr. Marliss Coots comments and agrees with PT pt said she can go either city [place] or [place], I advise pt Dr. Glori Bickers will start referral and [facility] will call to set up appt

## 2021-05-29 NOTE — Telephone Encounter (Signed)
Referral is done

## 2021-06-15 DIAGNOSIS — I10 Essential (primary) hypertension: Secondary | ICD-10-CM

## 2021-06-15 DIAGNOSIS — E785 Hyperlipidemia, unspecified: Secondary | ICD-10-CM

## 2021-06-15 DIAGNOSIS — E119 Type 2 diabetes mellitus without complications: Secondary | ICD-10-CM

## 2021-06-15 DIAGNOSIS — K219 Gastro-esophageal reflux disease without esophagitis: Secondary | ICD-10-CM

## 2021-06-16 MED ORDER — FAMOTIDINE 20 MG PO TABS: 20.0000 mg | ORAL_TABLET | Freq: Two times a day (BID) | ORAL | 3 refills | Status: DC

## 2021-06-16 MED ORDER — CARVEDILOL 6.25 MG PO TABS: 6.2500 mg | ORAL_TABLET | Freq: Two times a day (BID) | ORAL | 3 refills | Status: DC

## 2021-06-16 MED ORDER — LOSARTAN POTASSIUM 50 MG PO TABS: 50.0000 mg | ORAL_TABLET | Freq: Every day | ORAL | 3 refills | Status: DC

## 2021-06-16 MED ORDER — SITAGLIPTIN PHOS-METFORMIN HCL 50-1000 MG PO TABS: 1.0000 | ORAL_TABLET | Freq: Two times a day (BID) | ORAL | 0 refills | Status: DC

## 2021-06-16 MED ORDER — SITAGLIPTIN-METFORMIN HCL 50-1000 MG PO TABS
1.0000 | ORAL_TABLET | Freq: Two times a day (BID) | ORAL | 3 refills | Status: DC
Start: 2021-06-16 — End: 2021-06-19

## 2021-06-16 MED ORDER — AMLODIPINE BESYLATE 10 MG PO TABS: 10.0000 mg | ORAL_TABLET | Freq: Every day | ORAL | 3 refills | Status: DC

## 2021-06-16 MED ORDER — ROSUVASTATIN CALCIUM 20 MG PO TABS: 20.0000 mg | ORAL_TABLET | Freq: Every day | ORAL | 3 refills | Status: DC

## 2021-06-17 ENCOUNTER — Telehealth: Payer: Self-pay

## 2021-06-17 DIAGNOSIS — H25813 Combined forms of age-related cataract, bilateral: Secondary | ICD-10-CM | POA: Diagnosis not present

## 2021-06-17 DIAGNOSIS — H04123 Dry eye syndrome of bilateral lacrimal glands: Secondary | ICD-10-CM | POA: Diagnosis not present

## 2021-06-17 DIAGNOSIS — E119 Type 2 diabetes mellitus without complications: Secondary | ICD-10-CM | POA: Diagnosis not present

## 2021-06-17 DIAGNOSIS — H1045 Other chronic allergic conjunctivitis: Secondary | ICD-10-CM | POA: Diagnosis not present

## 2021-06-17 DIAGNOSIS — H35032 Hypertensive retinopathy, left eye: Secondary | ICD-10-CM | POA: Diagnosis not present

## 2021-06-17 DIAGNOSIS — H5703 Miosis: Secondary | ICD-10-CM | POA: Diagnosis not present

## 2021-06-17 LAB — HM DIABETES EYE EXAM

## 2021-06-17 NOTE — Telephone Encounter (Signed)
Lisseth Fisch KeyManuela Neptune - PA Case ID: [id number] - Rx #: [id number] Need help? Call us at [phone] Status Sent to [place] 50-1000MG  tablets Form [employer] Electronic Prior Authorization Form (2017 NCPDP) Original Claim Info 70 01MA Plan-Part B coverage only +02For Part D claims-submit to Part D ID 03P

## 2021-06-19 MED ORDER — SITAGLIPTIN PHOSPHATE 100 MG PO TABS: 100.0000 mg | ORAL_TABLET | Freq: Every day | ORAL | 3 refills | Status: DC

## 2021-06-19 MED ORDER — METFORMIN HCL 1000 MG PO TABS: 1000.0000 mg | ORAL_TABLET | Freq: Two times a day (BID) | ORAL | 3 refills | Status: DC

## 2021-06-19 MED ORDER — SITAGLIPTIN PHOSPHATE 100 MG PO TABS: 100.0000 mg | ORAL_TABLET | Freq: Every day | ORAL | 0 refills | Status: DC

## 2021-06-19 MED ORDER — METFORMIN HCL 1000 MG PO TABS: 1000.0000 mg | ORAL_TABLET | Freq: Two times a day (BID) | ORAL | 0 refills | Status: DC

## 2021-06-25 NOTE — Telephone Encounter (Signed)
Shelly Hebdon KeyManuela Neptune - PA Case ID: [id number] - Rx #: [id number] Need help? Call us at [phone] Outcome Deniedon June 22 Request Reference Number: [id number]. JANUMET TAB 50-1000 is denied for not meeting the prior authorization requirement(s). Details of this decision are in the notice attached below or have been faxed to you. Appeals are not supported through [place]. Please refer to the fax case notice for appeals information and instructions. Drug Janumet 50-1000MG  tablets Form [employer] Electronic Prior Authorization Form (2017 NCPDP) Original Claim Info 70 01MA Plan-Part B coverage only +02For Part D claims-submit to Part D ID [id number]

## 2021-06-25 NOTE — Telephone Encounter (Signed)
Yes, this is already been addressed.

## 2021-07-13 ENCOUNTER — Encounter: Payer: Self-pay | Admitting: Primary Care

## 2021-08-10 NOTE — Telephone Encounter (Signed)
I left v/m requesting pt to return call to Brenda Contreras.  Pt already has appt on 08/17/ 8 AM with Dr Lorelei Pont. Sending note to [facility] triage, Joellen CMA and Dr Lorelei Pont.

## 2021-08-10 NOTE — Telephone Encounter (Signed)
Please call to set up follow up as advised below.

## 2021-08-10 NOTE — Telephone Encounter (Signed)
[facility] - Client TELEPHONE ADVICE RECORD [facility] Patient Name: Brenda Contreras Gender: Female DOB: [birth year] Age: 70 Y 2 M 23 D Return Phone Number: [phone] (Primary) Address: City/ State/ Zip: [place] [address] Client [facility] - Client Client [facility] [place] - Day Physician Alma Friendly - NP Contact Type Call Who Is Calling Patient / Member / Family / Caregiver Call Type Triage / Clinical Relationship To Patient Self Return Phone Number [phone] (Primary) Chief Complaint Leg Pain Reason for Call Symptomatic / Request for Concepcion states she is calling from office. Patient is having burning in her leg and left side. Translation No Nurse Assessment Nurse: Zorita Pang, RN, Deborah Date/Time (Eastern Time): 08/10/2021 3:00:04 PM Confirm and document reason for call. If symptomatic, describe symptoms. ---The caller states that she is having a burning pain in her leg and back. She states that she had a spine xray that showed inflamed nerve. She states that she is having pain and burning pain that is worse. She has gone to PT. She states that it has worsened. She states that she has been on Gabapentin. Seeing a orthopedic surgeon on Wednesday. Does the patient have any new or worsening symptoms? ---Yes Will a triage be completed? ---Yes Related visit to physician within the last 2 weeks? ---Yes Does the PT have any chronic conditions? (i.e. diabetes, asthma, this includes High risk factors for pregnancy, etc.) ---Yes List chronic conditions. ---diabetes, hypertension, hyperlipidemia Is this a behavioral health or substance abuse call? ---No Guidelines Guideline Title Affirmed Question Affirmed Notes Nurse Date/Time Eilene Ghazi Time) Leg Pain Localized pain, redness or hard lump along vein Womble, RN, Neoma Laming 08/10/2021  3:04:47 PM Disp. Time Eilene Ghazi Time) Disposition Final User PLEASE NOTE: All timestamps contained within this report are represented as [place] Standard Time. CONFIDENTIALTY NOTICE: This fax transmission is intended only for the addressee. It contains information that is legally privileged, confidential or otherwise protected from use or disclosure. If you are not the intended recipient, you are strictly prohibited from reviewing, disclosing, copying using or disseminating any of this information or taking any action in reliance on or regarding this information. If you have received this fax in error, please notify us immediately by telephone so that we can arrange for its return to [place]. Phone: [phone], Toll-Free: [phone], Fax: [phone] Page: 2 of 2 Call Id: [id number] 08/10/2021 3:11:48 PM See PCP within 24 Hours Yes Zorita Pang, RN, Garrel Ridgel Disagree/Comply Comply Caller Understands Yes PreDisposition Call Doctor Care Advice Given Per Guideline SEE PCP WITHIN 24 HOURS: * You become worse Referrals REFERRED TO PCP OFFICE

## 2021-08-11 NOTE — Telephone Encounter (Signed)
Pt called back; pt said she saw Dr Glori Bickers 05/27/21 for the lower rt leg pain with LS spine and hip xrays; pt said has worsened that sometimes the burning pain in rt lower leg causes difficulty in bearing wt on rt leg and difficulty in walking especially in early mornings. Pt also has burning pain in mid rt buttock also; pain in buttocks does not radiate down rt leg. Pt said something new is sometimes pt will have the lower rt leg pain when sitting. Pt said the rt lower leg is not swollen,not red and not warm to touch. Pt has no CP or difficulty breathing.Voltaren gel is not helping the pain. Pt already has appt with Dr Lorelei Pont on 08/12/21 at 8 AM. Pt does not want to go to UC or ED and thinks she can wait for appt with Dr Lorelei Pont on 08/12/21. No available appts at [facility] on 08/11/21. UC & ED precautions given and pt voiced understanding. Sending note to Dr Lorelei Pont and Gentry Fitz NP as PCP who is out of office.

## 2021-08-11 NOTE — Progress Notes (Signed)
Brenda Contreras T. Brenda Sinyard, MD, [place] at [facility] [place], [address]  Phone: [phone]  FAX: [phone]  [name] - 70 y.o. female  MRN [id number]  Date of Birth: [birth year]  Date: 08/12/2021  PCP: Pleas Koch, NP  Referral: Pleas Koch, NP  Chief Complaint  Patient presents with   Burning lower right leg   Sciatica    This visit occurred during the SARS-CoV-2 public health emergency.  Safety protocols were in place, including screening questions prior to the visit, additional usage of staff PPE, and extensive cleaning of exam room while observing appropriate contact time as indicated for disinfecting solutions.   Subjective:   Brenda Contreras is a 70 y.o. very pleasant female patient with Body mass index is 24.58 kg/m. who presents with the following:  She presents with R sided pain and burning pain in the lower back and leg.  She previously saw Dr. Glori Bickers on 05/27/2021, felt most likely radicular and placed on Volt gel and otc.  I read prior notes and a few mychart messages.    She is on gabapentin, chronic nek pain, but she is on only a max of 200 mg BID.  Not sure of exact dosing.  Does not take.  R buttocks.  Will hurt a lot in the morning.  With sitting will hurt.   Did go to PT, but then d/c to HEP.  She describes some buttocks pain.  Predominantly the tingling sensation is in the lateral aspect of the lower extremity.  She does not have any symptoms in her foot.  She does not describe any symptoms in the thigh, but she does have pain in the buttocks region.  Review of Systems is noted in the HPI, as appropriate   Objective:   BP 120/60   Pulse 74   Temp 97.8 F (36.6 C) (Temporal)   Ht '5\' 3"'  (1.6 m)   Wt 138 lb 12 oz (62.9 kg)   SpO2 98%   BMI 24.58 kg/m    Range of motion at  the waist: Flexion: normal Extension: normal Lateral bending: normal Rotation: all  normal  No echymosis or edema Rises to examination table with no difficulty Gait: non antalgic  Inspection/Deformity: N Paraspinus Tenderness: Mild right greater than left  B Ankle Dorsiflexion (L5,4): 5/5 B Great Toe Dorsiflexion (L5,4): 5/5 Heel Walk (L5): WNL Toe Walk (S1): WNL Rise/Squat (L4): WNL  SENSORY B Medial Foot (L4): WNL B Dorsum (L5): WNL B Lateral (S1): WNL Light Touch: WNL Pinprick: WNL  REFLEXES Knee (L4): 2+ Ankle (S1): 2+  B SLR, seated: neg B SLR, supine: neg B FABER: neg B Reverse FABER: neg B Greater Troch: NT B Log Roll: neg B Sciatic Notch: NT   Radiology: DG Lumbar Spine Complete  Result Date: 05/27/2021 CLINICAL DATA:  Pain at RIGHT SI joint area, paresthesias in RIGHT lower extremity EXAM: LUMBAR SPINE - COMPLETE 4+ VIEW COMPARISON:  None FINDINGS: Hypoplastic last ribs. Five non-rib-bearing lumbar vertebra. Vertebral body heights maintained without fracture or subluxation. Scattered mild endplate spur formation and minimal disc space narrowing. Facet degenerative changes lower lumbar spine. No spondylolysis. SI joints preserved. Atherosclerotic calcifications aorta. 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 No acute abnormalities. Aortic Atherosclerosis (ICD10-I70.0). Electronically Signed   By: Lavonia Dana M.D.   On: 05/27/2021 20:36   DG Hip Unilat W OR W/O Pelvis 2-3 Views Right  Result Date:  05/27/2021 CLINICAL DATA:  Pain in RIGHT SI joint area with paresthesias RIGHT lower extremity question radiculopathy EXAM: DG HIP (WITH OR WITHOUT PELVIS) 2-3V RIGHT COMPARISON:  None FINDINGS: Hip and SI joint spaces preserved. Osseous mineralization normal. No fracture, dislocation, or bone destruction. IMPRESSION: Normal exam. Electronically Signed   By: Lavonia Dana M.D.   On: 05/27/2021 20:37     Assessment and Plan:     ICD-10-CM   1. Lumbar radiculopathy, acute  M54.16     2. Paresthesia of right leg  R20.2       Low back pain, buttocks pain to some degree, with some paresthesias laterally on the right lower extremity.  Spinal origin versus compressive peroneal neuropathy.  This would be treated essentially the same, so we will initiate plan of care as above with close follow-up.  Steroids, gabapentin, continue with physical fitness and strength.  Her exam is pretty benign.  We reviewed plain films face-to-face.  Patient Instructions  Generic Gabapentin Titration Schedule  Generic Gabapentin (generic form of Neurontin) comes in 100 mg tablets or capsules.   You have to titrate your dose slowly to reduce side effects and reduce sedation / sleepiness.    Week               Breakfast  Lunch   Dinner One                 0   0   100 mg Two   115m   0   1068mThree   10031m 100m38m100mg45mr   100mg 22m0mg  31mmg (282ms) Five   200mg (2 76m) 100mg   2066m(2 ta30mSix   200mg (2 tab50m00mg (2 tabs47m0mg (2 tabs)16men   200mg (2 tabs) 9mg (2 tabs) 353m (3 tabs) Ei31m  300mg (3 tabs) 20059m2 tabs) 300m43m tabs)  If y42mave any problems at any time, drop back to the previous dosing schedule. Continue with this dose for 1 week, and then try to go up the next step again.     Meds ordered this encounter  Medications   predniSONE (DELTASONE) 20 MG tablet    Sig: 2 tabs po for 7 days, then 1 tab po for 7 days    Dispense:  21 tablet    Refill:  0   gabapentin (NEURONTIN) 100 MG capsule    Sig: Take 1 capsule (100 mg total) by mouth 3 (three) times daily.    Dispense:  90 capsule    Refill:  3   Medications Discontinued During This Encounter  Medication Reason   gabapentin (NEURONTIN) 100 MG capsule    No orders of the defined types were placed in this encounter.   Follow-up: Return in about 6 weeks (around 09/23/2021).  Dragon Medical One speech-to-text software was used for transcription in this dictation.  Possible transcriptional errors can occur using Dragon software.    Editor, commissioningr T. Marios Gaiser, Maud Deedatient Encounter Medications as of 08/12/2021  Medication Sig   acetaminophen (TYLENOL) 500 MG tablet Take 1,000 mg by mouth every 6 (six) hours as needed for mild pain.   amLODipine (NORVASC) 10 MG tablet Take 1 tablet (10 mg total) by mouth daily. For blood pressure   aspirin EC 81 MG tablet Take 81 mg by mouth daily.   Blood Glucose Monitoring Suppl (ONE TOUCH ULTRA 2) w/Device KIT Use as instructed  to test blood sugar daily. Dx is E11.9   carvedilol (COREG) 6.25 MG tablet Take 1 tablet (6.25 mg total) by mouth 2 (two) times daily with a meal. For blood pressure.   dapagliflozin propanediol (FARXIGA) 5 MG TABS tablet Take 5 mg by mouth daily. For diabetes.   famotidine (PEPCID) 20 MG tablet Take 1 tablet (20 mg total) by mouth 2 (two) times daily. For heartburn.   gabapentin (NEURONTIN) 100 MG capsule Take 1 capsule (100 mg total) by mouth 3 (three) times daily.   glucose blood (ONE TOUCH ULTRA TEST) test strip OneTouch Use as instructed to test blood sugar twice daily. Dx is E11.9   losartan (COZAAR) 50 MG tablet Take 1 tablet (50 mg total) by mouth daily. For blood pressure.   metFORMIN (GLUCOPHAGE) 1000 MG tablet Take 1 tablet (1,000 mg total) by mouth 2 (two) times daily with a meal. For diabetes.   metFORMIN (GLUCOPHAGE) 1000 MG tablet Take 1 tablet (1,000 mg total) by mouth 2 (two) times daily with a meal. For diabetes.   ONE TOUCH LANCETS MISC Use as instructed to test blood sugar daily. Dx is E11.9   predniSONE (DELTASONE) 20 MG tablet 2 tabs po for 7 days, then 1 tab po for 7 days   rosuvastatin (CRESTOR) 20 MG tablet Take 1 tablet (20 mg total) by mouth at bedtime. For cholesterol.   sitaGLIPtin (JANUVIA) 100 MG tablet Take 1 tablet (100 mg total) by mouth daily. For diabetes.   sitaGLIPtin (JANUVIA) 100 MG tablet Take 1 tablet (100 mg total) by mouth daily. For diabetes.   [DISCONTINUED] gabapentin (NEURONTIN) 100 MG capsule Take 1 to 2  capsules by mouth once or twice daily for neck pain.   No facility-administered encounter medications on file as of 08/12/2021.

## 2021-08-11 NOTE — Telephone Encounter (Signed)
This can wait until tomorrow.

## 2021-08-12 ENCOUNTER — Encounter: Payer: Self-pay | Admitting: Family Medicine

## 2021-08-12 ENCOUNTER — Ambulatory Visit (INDEPENDENT_AMBULATORY_CARE_PROVIDER_SITE_OTHER): Payer: Medicare Other | Admitting: Family Medicine

## 2021-08-12 ENCOUNTER — Other Ambulatory Visit: Payer: Self-pay

## 2021-08-12 VITALS — BP 120/60 | HR 74 | Temp 97.8°F | Ht 63.0 in | Wt 138.8 lb

## 2021-08-12 DIAGNOSIS — R202 Paresthesia of skin: Secondary | ICD-10-CM

## 2021-08-12 DIAGNOSIS — M5416 Radiculopathy, lumbar region: Secondary | ICD-10-CM | POA: Diagnosis not present

## 2021-08-12 MED ORDER — GABAPENTIN 100 MG PO CAPS: 100.0000 mg | ORAL_CAPSULE | Freq: Three times a day (TID) | ORAL | 3 refills | Status: DC

## 2021-08-12 MED ORDER — PREDNISONE 20 MG PO TABS: ORAL_TABLET | ORAL | 0 refills | Status: DC

## 2021-08-12 NOTE — Patient Instructions (Signed)
Generic Gabapentin Titration Schedule  Generic Gabapentin (generic form of Neurontin) comes in 100 mg tablets or capsules.   You have to titrate your dose slowly to reduce side effects and reduce sedation / sleepiness.    Week               Breakfast  Lunch   Dinner One                 0   0   100 mg Two   100mg   0   100mg Three   100mg   100mg   100mg Four   100mg   100mg   200mg (2 tabs) Five   200mg (2 tabs) 100mg   200mg (2 tabs) Six   200mg (2 tabs) 200mg (2 tabs) 200mg (2 tabs) Seven   200mg (2 tabs) 200mg (2 tabs) 300mg (3 tabs) Eight   300mg (3 tabs) 200mg (2 tabs) 300mg (3 tabs)  If you have any problems at any time, drop back to the previous dosing schedule. Continue with this dose for 1 week, and then try to go up the next step again.    

## 2021-08-24 ENCOUNTER — Other Ambulatory Visit: Payer: Self-pay

## 2021-08-24 DIAGNOSIS — E119 Type 2 diabetes mellitus without complications: Secondary | ICD-10-CM

## 2021-08-24 MED ORDER — GLUCOSE BLOOD VI STRP: ORAL_STRIP | 0 refills | Status: DC

## 2021-09-23 ENCOUNTER — Ambulatory Visit (INDEPENDENT_AMBULATORY_CARE_PROVIDER_SITE_OTHER): Payer: Medicare Other | Admitting: Primary Care

## 2021-09-23 ENCOUNTER — Other Ambulatory Visit: Payer: Self-pay

## 2021-09-23 ENCOUNTER — Encounter: Payer: Self-pay | Admitting: Primary Care

## 2021-09-23 VITALS — BP 130/78 | HR 86 | Temp 97.8°F | Ht 63.0 in | Wt 146.0 lb

## 2021-09-23 DIAGNOSIS — M5416 Radiculopathy, lumbar region: Secondary | ICD-10-CM | POA: Diagnosis not present

## 2021-09-23 DIAGNOSIS — R202 Paresthesia of skin: Secondary | ICD-10-CM

## 2021-09-23 DIAGNOSIS — M25551 Pain in right hip: Secondary | ICD-10-CM

## 2021-09-23 NOTE — Assessment & Plan Note (Addendum)
Uncontrolled and progressively worse Plain films of right hip and lumbar spine negative Not responsive to conservative treatment  Discussed MR lumbar spine with patient, she agrees to proceed   MR lumbar spine ordered  Offered steroids as well, patient declined at this time She was encouraged to call if she reconsiders steroid therapy.  Consider holding Rosuvastatin if myalgias persist. She did not do this when recommended by me a few months ago.  I evaluated patient, was consulted regarding treatment, and agree with assessment and plan per Budd Palmer, RN, DNP student.   Allie Bossier, NP-C

## 2021-09-23 NOTE — Patient Instructions (Signed)
It was very nice to see you today!  You will be contacted regarding your referral your lumbar spine MRI.  Please let us know if you have not been contacted within two weeks.   Send [place] a MyChart message if you decide you would like [place] to order you steroids as well.

## 2021-09-23 NOTE — Assessment & Plan Note (Addendum)
Uncontrolled and progressively worse Plain films of right hip and lumbar spine negative Not responsive to conservative treatment  Discussed MR lumbar spine with patient, she agrees to proceed   MR lumbar spine ordered  Offered steroids as well, patient declined at this time She was encouraged to call if she reconsiders steroid therapy.  I evaluated patient, was consulted regarding treatment, and agree with assessment and plan per Budd Palmer, RN, DNP student.   Allie Bossier, NP-C

## 2021-09-23 NOTE — Progress Notes (Signed)
Subjective:    Patient ID: Brenda Contreras, female    DOB: [birth year], 70 y.o.   MRN: [id number]  HPI  Brenda Contreras is a very pleasant 70 y.o. female with a history of hypertension, CKD, chronic neck pain, paresthesias of right leg, right hip pain who presents today   Initially evaluated by Dr. Glori Bickers on 05/27/2021 for symptoms of right lower extremity paresthesias, keeping her awake at night, radiation up towards the hip.  She was provided with a trial of Voltaren gel and provided a handout for piriformis rehab.  She underwent lumbar spine plain films and hip films which show degenerative disc disease but otherwise negative.  She saw Dr. Lorelei Pont on 08/12/2021 for ongoing right lower extremity paresthesias and sciatica.  She was treated with steroids, gabapentin, home physical therapy.  Exam that day was overall benign.  Today she endorses continued pain from the right lower back and right hip with radiation from the right lateral knee down to her toes. The pain has become bothersome, no improvement with gabapentin despite dose adjustments.   She denies loss of bowel/bladder control, groin numbness. She has not undergone MRI. She never held her rosuvastatin as recommended previously.    Review of Systems  Musculoskeletal:  Positive for arthralgias and back pain.  Skin:  Negative for color change.  Neurological:  Positive for numbness.        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 colon   Dizziness    GERD (gastroesophageal reflux disease)    High cholesterol    Hypertension    Vaginal dryness     Social History   Socioeconomic History   Marital status: Widowed    Spouse name: Not on file   Number of children: Not on file   Years of education: Not on file   Highest education level: Not on file  Occupational History   Not on file  Tobacco Use   Smoking status: Never    Smokeless tobacco: Never  Vaping Use   Vaping Use: Never used  Substance and Sexual Activity   Alcohol use: No   Drug use: No   Sexual activity: Not Currently  Other Topics Concern   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      Has two children and two grandchildren.      Does not have a living will.  Desires CPR, would not want prolonged life support if futile.   Social Determinants of Health   Financial Resource Strain: Low Risk    Difficulty of Paying Living Expenses: Not hard at all  Food Insecurity: No Food Insecurity   Worried About Charity fundraiser in the Last Year: Never true   [place] in the Last Year: Never true  Transportation Needs: No Transportation Needs   Lack of Transportation (Medical): No   Lack of Transportation (Non-Medical): No  Physical Activity: Sufficiently Active   Days of Exercise per Week: 7 days   Minutes of Exercise per Session: 40 min  Stress: No Stress Concern Present   Feeling of Stress : Not at all  Social Connections: Not on file  Intimate Partner Violence: Not At Risk   Fear of Current or Ex-Partner: No   Emotionally Abused: No   Physically Abused: No   Sexually Abused: No  Past Surgical History:  Procedure Laterality Date   APPENDECTOMY     BREAST BIOPSY     TUBAL LIGATION      Family History  Problem Relation Age of Onset   Diabetes Father    Hypertension Father    Hyperlipidemia Father    Stroke Father    Diabetes Sister    Hyperlipidemia Sister    Hypertension Sister    Lung cancer Paternal Uncle    Diabetes Paternal Grandmother    Heart disease Paternal Grandfather     No Known Allergies  Current Outpatient Medications on File Prior to Visit  Medication Sig Dispense Refill   acetaminophen (TYLENOL) 500 MG tablet Take 1,000 mg by mouth every 6 (six) hours as needed for mild pain.     amLODipine (NORVASC) 10 MG tablet Take 1 tablet (10 mg total) by mouth daily. For  blood pressure 90 tablet 3   aspirin EC 81 MG tablet Take 81 mg by mouth daily.     Blood Glucose Monitoring Suppl (ONE TOUCH ULTRA 2) w/Device KIT Use as instructed to test blood sugar daily. Dx is E11.9 1 each 0   carvedilol (COREG) 6.25 MG tablet Take 1 tablet (6.25 mg total) by mouth 2 (two) times daily with a meal. For blood pressure. 180 tablet 3   famotidine (PEPCID) 20 MG tablet Take 1 tablet (20 mg total) by mouth 2 (two) times daily. For heartburn. 180 tablet 3   gabapentin (NEURONTIN) 100 MG capsule Take 1 capsule (100 mg total) by mouth 3 (three) times daily. 90 capsule 3   glucose blood (ONE TOUCH ULTRA TEST) test strip OneTouch Use as instructed to test blood sugar twice daily. Dx is E11.9 100 each 0   losartan (COZAAR) 50 MG tablet Take 1 tablet (50 mg total) by mouth daily. For blood pressure. 90 tablet 3   metFORMIN (GLUCOPHAGE) 1000 MG tablet Take 1 tablet (1,000 mg total) by mouth 2 (two) times daily with a meal. For diabetes. 180 tablet 3   metFORMIN (GLUCOPHAGE) 1000 MG tablet Take 1 tablet (1,000 mg total) by mouth 2 (two) times daily with a meal. For diabetes. 60 tablet 0   ONE TOUCH LANCETS MISC Use as instructed to test blood sugar daily. Dx is E11.9 200 each 2   rosuvastatin (CRESTOR) 20 MG tablet Take 1 tablet (20 mg total) by mouth at bedtime. For cholesterol. 90 tablet 3   sitaGLIPtin (JANUVIA) 100 MG tablet Take 1 tablet (100 mg total) by mouth daily. For diabetes. 90 tablet 3   sitaGLIPtin (JANUVIA) 100 MG tablet Take 1 tablet (100 mg total) by mouth daily. For diabetes. 30 tablet 0   No current facility-administered medications on file prior to visit.    BP 130/78   Pulse 86   Temp 97.8 F (36.6 C) (Temporal)   Ht '5\' 3"'  (1.6 m)   Wt 146 lb (66.2 kg)   SpO2 95%   BMI 25.86 kg/m  Objective:   Physical Exam Cardiovascular:     Rate and Rhythm: Normal rate and regular rhythm.  Pulmonary:     Effort: Pulmonary effort is normal.     Breath sounds: Normal  breath sounds.  Musculoskeletal:     Cervical back: Neck supple.     Right hip: Normal.     Left hip: Normal.       Legs:     Comments: 5/5 strength to bilateral lower extremities.  Ambulates well.    Skin:  General: Skin is warm and dry.          Assessment & Plan:      This visit occurred during the SARS-CoV-2 public health emergency.  Safety protocols were in place, including screening questions prior to the visit, additional usage of staff PPE, and extensive cleaning of exam room while observing appropriate contact time as indicated for disinfecting solutions.

## 2021-09-23 NOTE — Progress Notes (Signed)
Established Patient Office Visit  Subjective:  Patient ID: Brenda Contreras, female    DOB: 11/09/1951  Age: 70 y.o. MRN: [id number]  CC: No chief complaint on file.   HPI Brenda Contreras is a 70 year old female with a pertinent past medical history of right hip pain with paresthesia of the right leg presents for follow up of right lower extremity radiculopathy. She was previously treated for this by Dr. Glori Bickers in June 2022 with Voltaren gel and OTC medications. Lumbar spine and right hip XR were unremarkable. She was subsequently seen by Dr. Lorelei Pont in August 2022 for ongoing symptoms and was treated with a 7 day course of prednisone and up-titration of Gabapentin to 800 mg per day and PT.  The prednisone was initially helpful but after 3 days, her symptoms returned. Today, she feels that she is worse than before. Her right buttock pain is dull, and the lateral aspect of her lower extremity from the knee down to her ankle is burning with paraesthesias. The pain has woken her up for the last 2 nights and is the worst in the morning and after long periods of inactivity, causing her to limp. It gets better with ambulation and sometimes even subsides all together. She stopped using Voltaren gel and completed her prednisone regimen. She is currently taking 800 mg Gabapentin per day. She takes 1000 mg Tylenol daily and heating pads which help.   She was advised to hold her Rosuvastatin to alleviate suspected medication-related myalgias. She never stopped taking this and has no acute complaints of other myalgias today.  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 colon   Dizziness    GERD (gastroesophageal reflux disease)    High cholesterol    Hypertension    Vaginal dryness     Past Surgical History:  Procedure Laterality Date   APPENDECTOMY     BREAST BIOPSY     TUBAL LIGATION      Family History   Problem Relation Age of Onset   Diabetes Father    Hypertension Father    Hyperlipidemia Father    Stroke Father    Diabetes Sister    Hyperlipidemia Sister    Hypertension Sister    Lung cancer Paternal Uncle    Diabetes Paternal Grandmother    Heart disease Paternal Grandfather     Social History   Socioeconomic History   Marital status: Widowed    Spouse name: Not on file   Number of children: Not on file   Years of education: Not on file   Highest education level: Not on file  Occupational History   Not on file  Tobacco Use   Smoking status: Never   Smokeless tobacco: Never  Vaping Use   Vaping Use: Never used  Substance and Sexual Activity   Alcohol use: No   Drug use: No   Sexual activity: Not Currently  Other Topics Concern   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      Has two children and two grandchildren.      Does not have a living will.  Desires CPR, would not want prolonged life support if futile.   Social Determinants of Health   Financial Resource Strain: Low Risk    Difficulty of Paying Living Expenses: Not hard at all  Food  Insecurity: No Food Insecurity   Worried About Charity fundraiser in the Last Year: Never true   Ran Out of Food in the Last Year: Never true  Transportation Needs: No Transportation Needs   Lack of Transportation (Medical): No   Lack of Transportation (Non-Medical): No  Physical Activity: Sufficiently Active   Days of Exercise per Week: 7 days   Minutes of Exercise per Session: 40 min  Stress: No Stress Concern Present   Feeling of Stress : Not at all  Social Connections: Not on file  Intimate Partner Violence: Not At Risk   Fear of Current or Ex-Partner: No   Emotionally Abused: No   Physically Abused: No   Sexually Abused: No    Outpatient Medications Prior to Visit  Medication Sig Dispense Refill   acetaminophen (TYLENOL) 500 MG tablet Take 1,000 mg by mouth every 6 (six)  hours as needed for mild pain.     amLODipine (NORVASC) 10 MG tablet Take 1 tablet (10 mg total) by mouth daily. For blood pressure 90 tablet 3   aspirin EC 81 MG tablet Take 81 mg by mouth daily.     Blood Glucose Monitoring Suppl (ONE TOUCH ULTRA 2) w/Device KIT Use as instructed to test blood sugar daily. Dx is E11.9 1 each 0   carvedilol (COREG) 6.25 MG tablet Take 1 tablet (6.25 mg total) by mouth 2 (two) times daily with a meal. For blood pressure. 180 tablet 3   dapagliflozin propanediol (FARXIGA) 5 MG TABS tablet Take 5 mg by mouth daily. For diabetes. 90 tablet 3   famotidine (PEPCID) 20 MG tablet Take 1 tablet (20 mg total) by mouth 2 (two) times daily. For heartburn. 180 tablet 3   gabapentin (NEURONTIN) 100 MG capsule Take 1 capsule (100 mg total) by mouth 3 (three) times daily. 90 capsule 3   glucose blood (ONE TOUCH ULTRA TEST) test strip OneTouch Use as instructed to test blood sugar twice daily. Dx is E11.9 100 each 0   losartan (COZAAR) 50 MG tablet Take 1 tablet (50 mg total) by mouth daily. For blood pressure. 90 tablet 3   metFORMIN (GLUCOPHAGE) 1000 MG tablet Take 1 tablet (1,000 mg total) by mouth 2 (two) times daily with a meal. For diabetes. 180 tablet 3   metFORMIN (GLUCOPHAGE) 1000 MG tablet Take 1 tablet (1,000 mg total) by mouth 2 (two) times daily with a meal. For diabetes. 60 tablet 0   ONE TOUCH LANCETS MISC Use as instructed to test blood sugar daily. Dx is E11.9 200 each 2   predniSONE (DELTASONE) 20 MG tablet 2 tabs po for 7 days, then 1 tab po for 7 days 21 tablet 0   rosuvastatin (CRESTOR) 20 MG tablet Take 1 tablet (20 mg total) by mouth at bedtime. For cholesterol. 90 tablet 3   sitaGLIPtin (JANUVIA) 100 MG tablet Take 1 tablet (100 mg total) by mouth daily. For diabetes. 90 tablet 3   sitaGLIPtin (JANUVIA) 100 MG tablet Take 1 tablet (100 mg total) by mouth daily. For diabetes. 30 tablet 0   No facility-administered medications prior to visit.    No Known  Allergies  ROS Review of Systems  Constitutional: Negative.   Respiratory: Negative.    Cardiovascular: Negative.   Genitourinary: Negative.   Musculoskeletal:  Positive for arthralgias and myalgias.       Right hip and buttock pain, pain to lateral aspect of lower extremity from knee to ankle  Skin: Negative.  Objective:    Physical Exam Constitutional:      Appearance: Normal appearance.  Cardiovascular:     Rate and Rhythm: Normal rate and regular rhythm.  Pulmonary:     Effort: Pulmonary effort is normal.     Breath sounds: Normal breath sounds.  Musculoskeletal:        General: Normal range of motion.  Skin:    General: Skin is warm and dry.     Findings: No erythema.  Neurological:     General: No focal deficit present.     Mental Status: She is alert. Mental status is at baseline.  Psychiatric:        Mood and Affect: Mood normal.        Behavior: Behavior normal.    There were no vitals taken for this visit. Wt Readings from Last 3 Encounters:  08/12/21 138 lb 12 oz (62.9 kg)  05/27/21 138 lb 2 oz (62.7 kg)  05/05/21 141 lb 12.8 oz (64.3 kg)     Health Maintenance Due  Topic Date Due   Zoster Vaccines- Shingrix (1 of 2) Never done   COVID-19 Vaccine (3 - Booster for Moderna series) 02/21/2021   FOOT EXAM  05/15/2021   INFLUENZA VACCINE  07/27/2021    There are no preventive care reminders to display for this patient.  Lab Results  Component Value Date   TSH 2.16 10/21/2020   Lab Results  Component Value Date   WBC 8.8 10/21/2020   HGB 12.6 10/21/2020   HCT 38.5 10/21/2020   MCV 87.6 10/21/2020   PLT 223.0 10/21/2020   Lab Results  Component Value Date   NA 137 05/21/2021   K 4.0 05/21/2021   CO2 23 05/21/2021   GLUCOSE 126 (H) 05/21/2021   BUN 19 05/21/2021   CREATININE 1.40 (H) 05/21/2021   BILITOT 1.3 (H) 05/05/2021   ALKPHOS 78 05/05/2021   AST 21 05/05/2021   ALT 23 05/05/2021   PROT 6.9 05/05/2021   ALBUMIN 4.4 05/05/2021    CALCIUM 10.1 05/21/2021   ANIONGAP 9 05/01/2020   GFR 37.98 (L) 05/21/2021   Lab Results  Component Value Date   CHOL 120 05/05/2021   Lab Results  Component Value Date   HDL 40.40 05/05/2021   Lab Results  Component Value Date   LDLCALC 46 05/05/2021   Lab Results  Component Value Date   TRIG 167.0 (H) 05/05/2021   Lab Results  Component Value Date   CHOLHDL 3 05/05/2021   Lab Results  Component Value Date   HGBA1C 5.7 05/05/2021      Assessment & Plan:   Problem List Items Addressed This Visit   None   No orders of the defined types were placed in this encounter.   Follow-up: No follow-ups on file.    Ninfa Meeker, RN

## 2021-10-08 DIAGNOSIS — R202 Paresthesia of skin: Secondary | ICD-10-CM

## 2021-10-08 DIAGNOSIS — M5416 Radiculopathy, lumbar region: Secondary | ICD-10-CM

## 2021-10-13 MED ORDER — GABAPENTIN 100 MG PO CAPS: ORAL_CAPSULE | ORAL | 0 refills | Status: DC

## 2021-10-14 ENCOUNTER — Ambulatory Visit
Admission: RE | Admit: 2021-10-14 | Discharge: 2021-10-14 | Disposition: A | Discharge status: Home / Self Care | Payer: Medicare Other | Source: Ambulatory Visit | Attending: Primary Care | Admitting: Primary Care

## 2021-10-14 ENCOUNTER — Other Ambulatory Visit: Payer: Self-pay

## 2021-10-14 DIAGNOSIS — M5416 Radiculopathy, lumbar region: Secondary | ICD-10-CM | POA: Insufficient documentation

## 2021-10-14 DIAGNOSIS — R202 Paresthesia of skin: Secondary | ICD-10-CM | POA: Insufficient documentation

## 2021-10-14 DIAGNOSIS — M545 Low back pain, unspecified: Secondary | ICD-10-CM | POA: Diagnosis not present

## 2021-10-31 IMAGING — MR MR ABDOMEN WO/W CM
13 of 17 series · 32 of 48 positions shown · IV contrast (14ml Multihance)
Comparison: 05/01/2020

CLINICAL DATA: Complex and indeterminate left kidney lower pole
lesion on recent CT angiography of 05/01/2020, for further
characterization.

EXAM:
MRI ABDOMEN WITHOUT AND WITH CONTRAST
TECHNIQUE: Multiplanar multisequence MR imaging of the abdomen was performed
both before and after the administration of intravenous contrast.
CONTRAST:  14mL MULTIHANCE GADOBENATE DIMEGLUMINE 529 MG/ML IV SOLN

[Series 3: T2 · coronal · 5.0mm · 1.56mm/px · 1 of 34 slices shown (1 of 3)]
[im 1/34]
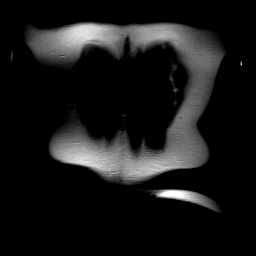

[Series 4: T2 · axial · 5.0mm · 1.41mm/px · 1 of 41 slices shown (2 of 3)]
[im 1/41]
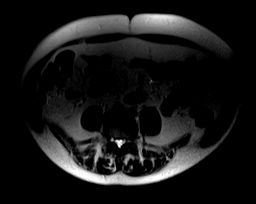

[Series 5: T2 · axial · 6.5mm · 0.74mm/px · 1 of 37 slices shown (3 of 3)]
[im 1/37]
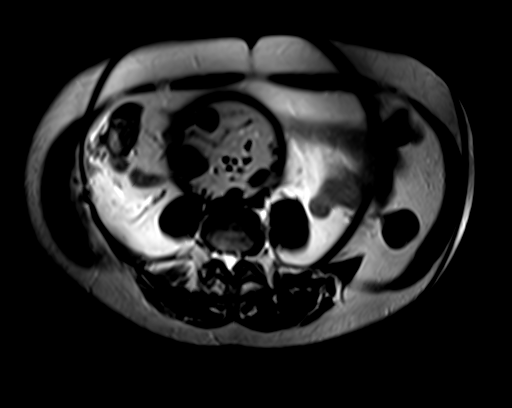

[Series 6: ep2d_[id number]_500_800_p2 · axial · 6.0mm · 1.98mm/px · z∈[-112,+169]mm · 3 of 111 slices shown]
[im 1/111]
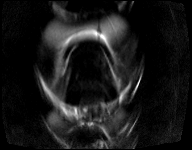
[im 56/111]
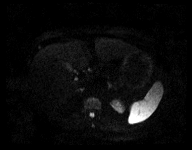
[im 111/111]
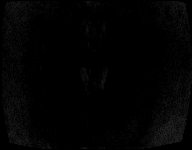

[Series 7: [id number]_adc · axial · 6.0mm · 1.98mm/px · 1 of 37 slices shown]
[im 1/37]
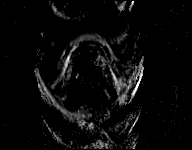

[Series 8: axial tru fisp · axial · 5.0mm · 1.45mm/px · 1 of 41 slices shown]
[im 1/41]
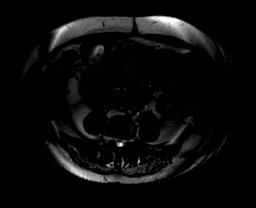

[Series 9: axial in out · axial · 6.0mm · 0.74mm/px · z∈[-121,+134]mm · 2 of 76 slices shown]
[im 1/76]
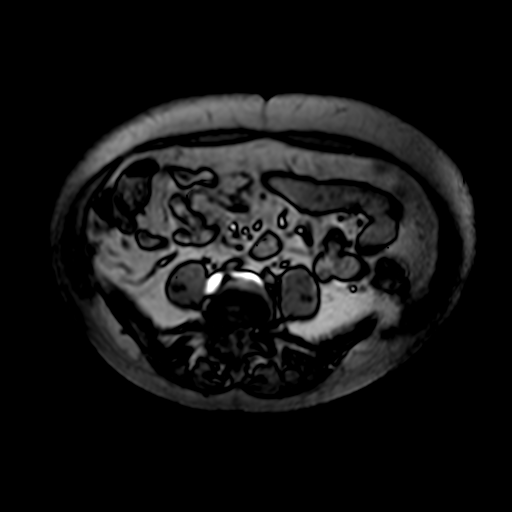
[im 76/76]
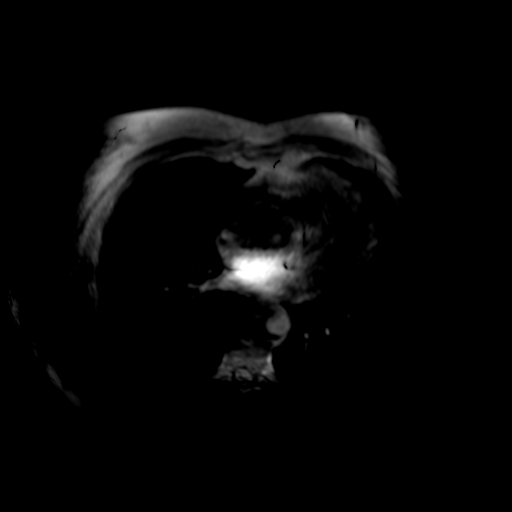

[Series 10: T1 dynamic · axial · non-contrast · 2.2mm · 0.78mm/px · z∈[-116,+110]mm · 4 of 104 slices shown]
[im 1/104]
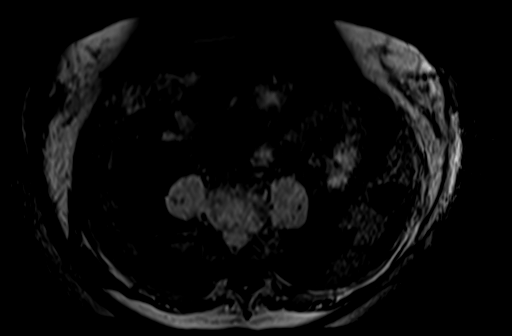
[im 35/104]
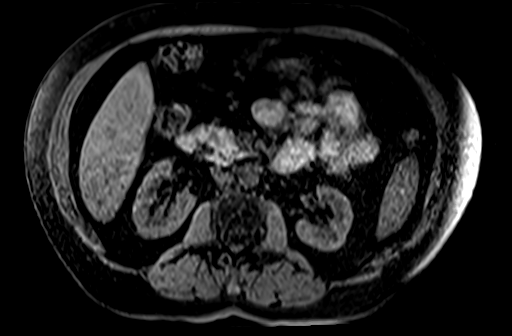
[im 69/104]
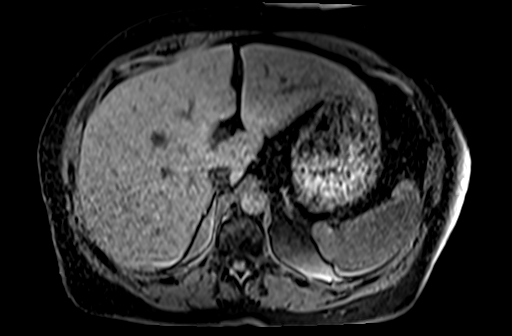
[im 104/104]
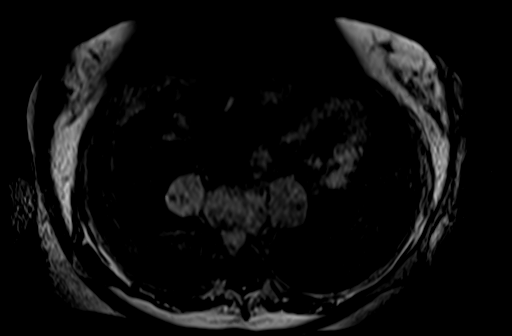

[Series 11: post 25 sec · axial · 2.2mm · 0.78mm/px · z∈[-116,+110]mm · 4 of 104 slices shown]
[im 1/104]
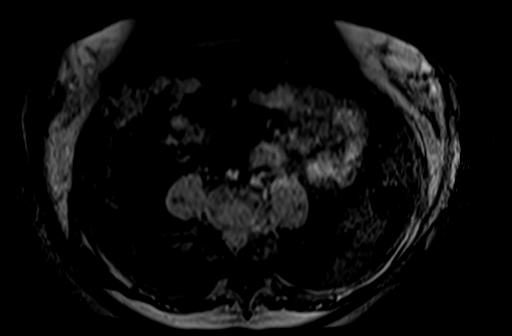
[im 35/104]
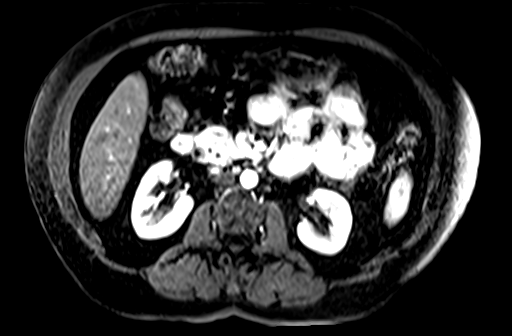
[im 69/104]
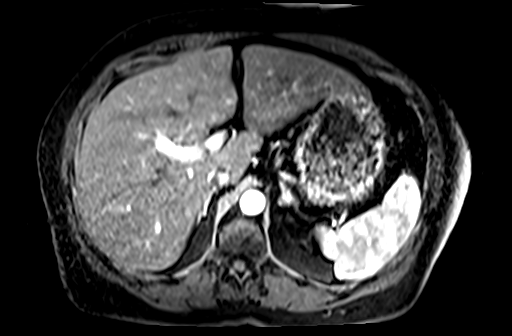
[im 104/104]
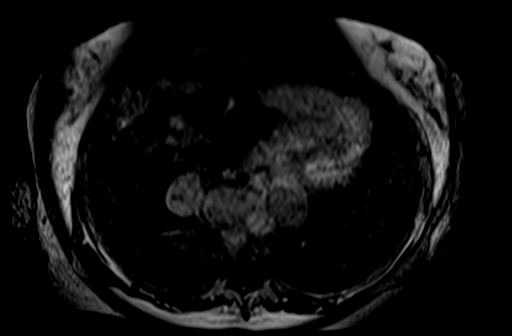

[Series 12: post 25 sec_sub · axial · 2.2mm · 0.78mm/px · z∈[-116,+110]mm · 4 of 104 slices shown]
[im 1/104]
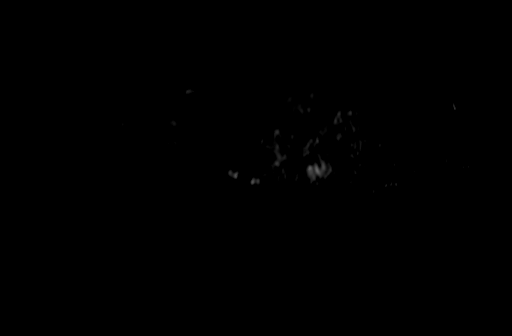
[im 35/104]
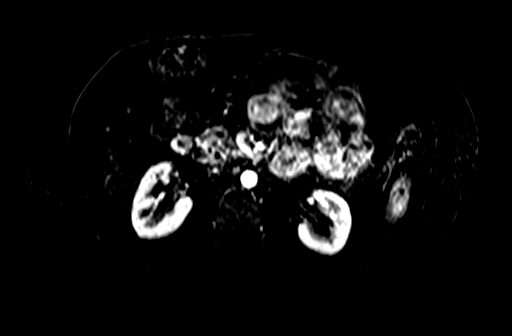
[im 69/104]
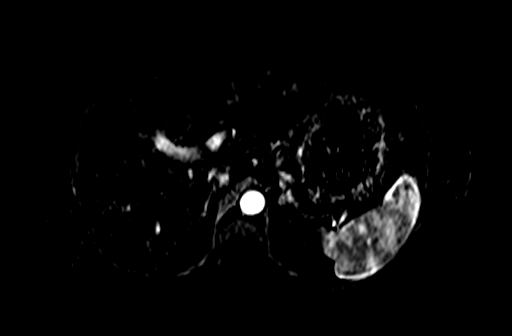
[im 104/104]
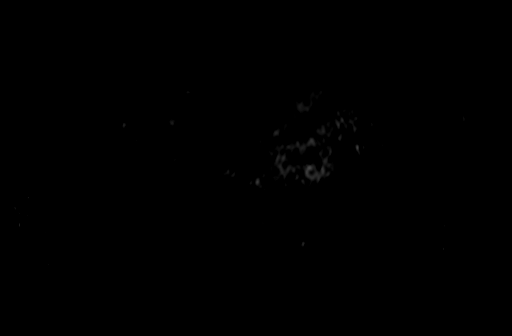

[Series 13: post 45 sec · axial · 2.2mm · 0.78mm/px · z∈[-116,+110]mm · 4 of 104 slices shown]
[im 1/104]
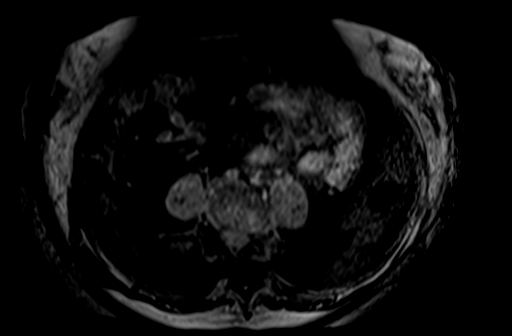
[im 35/104]
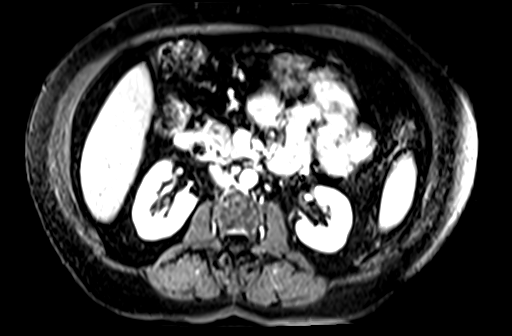
[im 69/104]
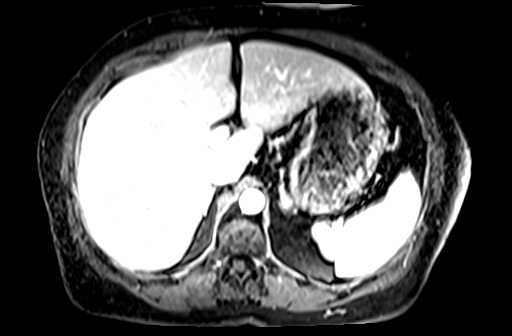
[im 104/104]
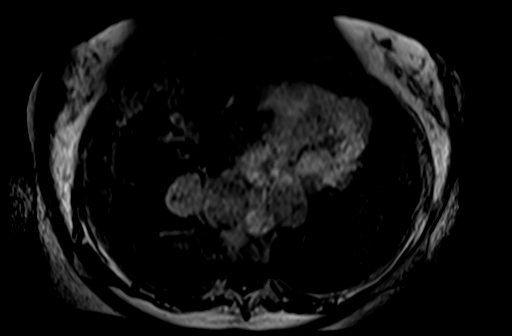

[Series 14: post 45 sec_sub · axial · 2.2mm · 0.78mm/px · z∈[-116,+110]mm · 4 of 104 slices shown]
[im 1/104]
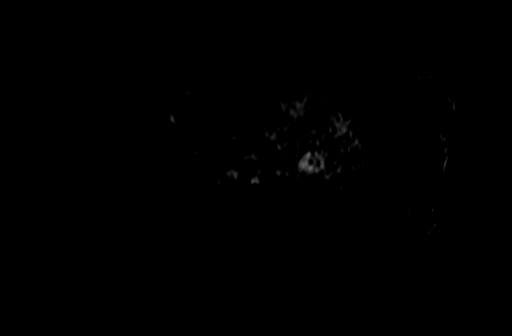
[im 35/104]
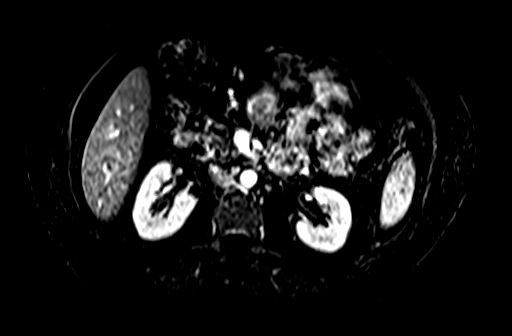
[im 69/104]
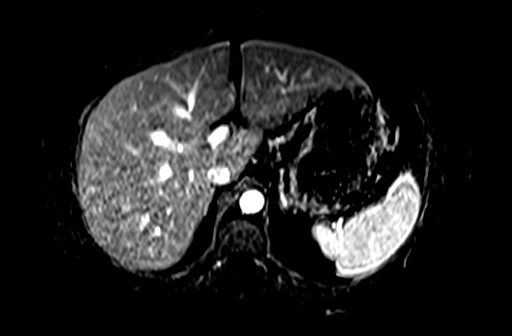
[im 104/104]
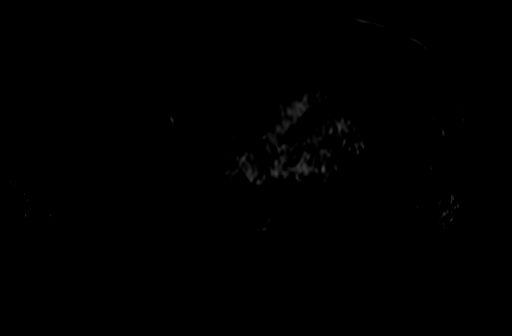

[Series 15: post 90 sec · axial · 2.2mm · 0.78mm/px · z∈[-116,-41]mm · 2 of 104 slices shown]
[im 1/104]
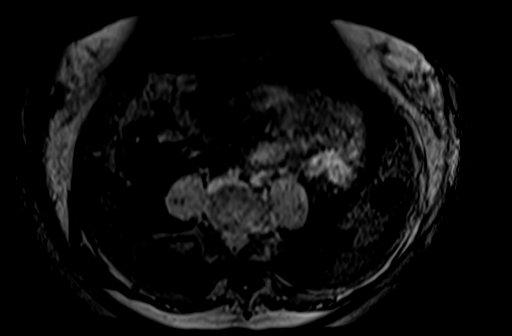
[im 35/104]
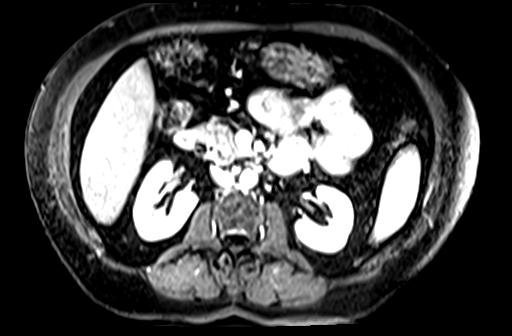

[32 of 48 positions shown; findings below may reference images not displayed]

FINDINGS: Lower chest: Unremarkable

Hepatobiliary: Diffuse hepatic steatosis. Accentuated T1 signal in
the gallbladder probably from sludge. No significant focal liver
lesion is identified. No biliary dilatation.

Pancreas: No clinically significant pancreatic lesion is identified.

Spleen:  Unremarkable

Adrenals/Urinary Tract: The lesion of concern in the left kidney
lower pole measures 1.0 cm in diameter and has high T2 and low T1
signal characteristics and no observed enhancement. Appearance on CT
is ascribed to pseudo enhancement. This lesion is felt to represent
a Bosniak category 1 cyst. No significant renal lesion is
identified. A smaller Bosniak category 1 cyst is present along the
left kidney lower pole posteriorly on image [DATE] and does not
enhance.

Both adrenal glands appear normal.

Stomach/Bowel: Scattered diverticula of the descending colon.

Vascular/Lymphatic:  Aortoiliac atherosclerotic vascular disease.

Other:  No supplemental non-categorized findings.

Musculoskeletal: Unremarkable
IMPRESSION: 1. The lesion of concern in the left kidney lower pole is felt to
represent a Bosniak category 1 cyst. There is also a smaller Bosniak
category 1 cyst along the left kidney lower pole posteriorly. No
significant renal lesion is identified.
2. Diffuse hepatic steatosis.
3. Scattered diverticula of the descending colon.

## 2021-11-05 ENCOUNTER — Ambulatory Visit: Payer: Medicare Other | Admitting: Primary Care

## 2021-12-08 ENCOUNTER — Other Ambulatory Visit: Payer: Self-pay | Admitting: Primary Care

## 2021-12-08 DIAGNOSIS — E119 Type 2 diabetes mellitus without complications: Secondary | ICD-10-CM

## 2021-12-22 ENCOUNTER — Ambulatory Visit: Payer: Medicare Other | Admitting: Primary Care

## 2021-12-31 ENCOUNTER — Ambulatory Visit: Payer: Medicare Other | Admitting: Primary Care

## 2022-02-17 ENCOUNTER — Encounter: Payer: Self-pay | Admitting: Family

## 2022-02-17 ENCOUNTER — Other Ambulatory Visit: Payer: Self-pay

## 2022-02-17 ENCOUNTER — Ambulatory Visit (INDEPENDENT_AMBULATORY_CARE_PROVIDER_SITE_OTHER): Payer: Medicare Other | Admitting: Family

## 2022-02-17 VITALS — BP 132/82 | HR 85 | Ht 62.0 in | Wt 154.0 lb

## 2022-02-17 DIAGNOSIS — R051 Acute cough: Secondary | ICD-10-CM | POA: Diagnosis not present

## 2022-02-17 DIAGNOSIS — J40 Bronchitis, not specified as acute or chronic: Secondary | ICD-10-CM

## 2022-02-17 DIAGNOSIS — H1032 Unspecified acute conjunctivitis, left eye: Secondary | ICD-10-CM

## 2022-02-17 MED ORDER — GUAIFENESIN-CODEINE 100-10 MG/5ML PO SYRP: 5.0000 mL | ORAL_SOLUTION | Freq: Three times a day (TID) | ORAL | 0 refills | Status: AC | PRN

## 2022-02-17 MED ORDER — OFLOXACIN 0.3 % OP SOLN: 1.0000 [drp] | Freq: Four times a day (QID) | OPHTHALMIC | 0 refills | Status: AC

## 2022-02-17 MED ORDER — AZITHROMYCIN 250 MG PO TABS: ORAL_TABLET | ORAL | 0 refills | Status: AC

## 2022-02-17 NOTE — Progress Notes (Signed)
Established Patient Office Visit  Subjective:  Patient ID: Brenda Contreras, female    DOB: May 22, 1951  Age: 71 y.o. MRN: [id number]  CC:  Chief Complaint  Patient presents with   Cough    Pt stated --having ongoing coughing worse at night--1 week. Tried day/night  nyquil    HPI Brenda Contreras is here today with concerns.   One week ago started with cough. Worse at night. Does have chest congestion.sob at times. Cough is bringing up sputum but unable to spit it out. Left eye with watering, some crusting yesterday am and with some redness. Left upper lid a bit swollen, matted crusted in the am.   Trying dayquil and nyquil without much relief, only slight.  Negative for covid two days ago.  No fever or chills.   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 colon   Dizziness    GERD (gastroesophageal reflux disease)    High cholesterol    Hypertension    Vaginal dryness     Past Surgical History:  Procedure Laterality Date   APPENDECTOMY     BREAST BIOPSY     TUBAL LIGATION      Family History  Problem Relation Age of Onset   Diabetes Father    Hypertension Father    Hyperlipidemia Father    Stroke Father    Diabetes Sister    Hyperlipidemia Sister    Hypertension Sister    Lung cancer Paternal Uncle    Diabetes Paternal Grandmother    Heart disease Paternal Grandfather     Social History   Socioeconomic History   Marital status: Widowed    Spouse name: Not on file   Number of children: Not on file   Years of education: Not on file   Highest education level: Not on file  Occupational History   Not on file  Tobacco Use   Smoking status: Never   Smokeless tobacco: Never  Vaping Use   Vaping Use: Never used  Substance and Sexual Activity   Alcohol use: No   Drug use: No   Sexual activity: Not Currently  Other Topics Concern   Not on file  Social History  Narrative   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      Has two children and two grandchildren.      Does not have a living will.  Desires CPR, would not want prolonged life support if futile.   Social Determinants of Health   Financial Resource Strain: Low Risk    Difficulty of Paying Living Expenses: Not hard at all  Food Insecurity: No Food Insecurity   Worried About Charity fundraiser in the Last Year: Never true   [place] in the Last Year: Never true  Transportation Needs: No Transportation Needs   Lack of Transportation (Medical): No   Lack of Transportation (Non-Medical): No  Physical Activity: Sufficiently Active   Days of Exercise per Week: 7 days   Minutes of Exercise per Session: 40 min  Stress: No Stress Concern Present   Feeling of Stress : Not at all  Social Connections: Not on file  Intimate Partner Violence: Not At Risk   Fear of Current or Ex-Partner: No   Emotionally Abused: No   Physically Abused: No   Sexually Abused: No    Outpatient Medications Prior to Visit  Medication Sig Dispense Refill   acetaminophen (TYLENOL) 500 MG tablet Take 1,000 mg by mouth every 6 (six) hours as needed for mild pain.     amLODipine (NORVASC) 10 MG tablet Take 1 tablet (10 mg total) by mouth daily. For blood pressure 90 tablet 3   aspirin EC 81 MG tablet Take 81 mg by mouth daily.     Blood Glucose Monitoring Suppl (ONE TOUCH ULTRA 2) w/Device KIT Use as instructed to test blood sugar daily. Dx is E11.9 1 each 0   carvedilol (COREG) 6.25 MG tablet Take 1 tablet (6.25 mg total) by mouth 2 (two) times daily with a meal. For blood pressure. 180 tablet 3   famotidine (PEPCID) 20 MG tablet Take 1 tablet (20 mg total) by mouth 2 (two) times daily. For heartburn. 180 tablet 3   gabapentin (NEURONTIN) 100 MG capsule Take 2 capsules by mouth 3 times daily for 2 weeks, then reduce to 1 capsule 3 times daily for 2 weeks, then reduce to 1 capsule twice daily. 180 capsule 0   glucose  blood (ONETOUCH ULTRA) test strip USE TO CHECK BLOOD SUGAR TWICE DAILY AS INSTRUCTED DX E11.9 200 each 1   losartan (COZAAR) 50 MG tablet Take 1 tablet (50 mg total) by mouth daily. For blood pressure. 90 tablet 3   metFORMIN (GLUCOPHAGE) 1000 MG tablet Take 1 tablet (1,000 mg total) by mouth 2 (two) times daily with a meal. For diabetes. 180 tablet 3   metFORMIN (GLUCOPHAGE) 1000 MG tablet Take 1 tablet (1,000 mg total) by mouth 2 (two) times daily with a meal. For diabetes. 60 tablet 0   ONE TOUCH LANCETS MISC Use as instructed to test blood sugar daily. Dx is E11.9 200 each 2   rosuvastatin (CRESTOR) 20 MG tablet Take 1 tablet (20 mg total) by mouth at bedtime. For cholesterol. 90 tablet 3   sitaGLIPtin (JANUVIA) 100 MG tablet Take 1 tablet (100 mg total) by mouth daily. For diabetes. 90 tablet 3   sitaGLIPtin (JANUVIA) 100 MG tablet Take 1 tablet (100 mg total) by mouth daily. For diabetes. 30 tablet 0   No facility-administered medications prior to visit.    No Known Allergies  ROS Review of Systems  Constitutional:  Negative for chills and fever.  HENT:  Negative for congestion, ear pain, sinus pressure and sore throat.   Eyes:  Positive for discharge (clear). Negative for pain, redness, itching and visual disturbance.  Respiratory:  Positive for cough, chest tightness and shortness of breath. Negative for wheezing.   Cardiovascular:  Negative for chest pain and palpitations.     Objective:    Physical Exam Constitutional:      General: She is not in acute distress.    Appearance: Normal appearance. She is not ill-appearing.  HENT:     Right Ear: Tympanic membrane normal.     Left Ear: Tympanic membrane normal.     Nose: Nose normal. No congestion or rhinorrhea.     Mouth/Throat:     Mouth: Mucous membranes are moist.     Pharynx: No pharyngeal swelling, oropharyngeal exudate or posterior oropharyngeal erythema.     Tonsils: No tonsillar exudate.  Eyes:     Extraocular  Movements: Extraocular movements intact.     Conjunctiva/sclera:     Left eye: Left conjunctiva is injected.     Pupils: Pupils are equal, round, and reactive to light.     Comments: Left upper lid with mild swelling irritation Clear watery discharge  from left eye Mild redness of sclera  Neck:     Thyroid: No thyroid mass.  Cardiovascular:     Rate and Rhythm: Normal rate and regular rhythm.  Pulmonary:     Effort: Pulmonary effort is normal.     Breath sounds: Normal breath sounds.  Lymphadenopathy:     Cervical:     Right cervical: No superficial cervical adenopathy.    Left cervical: No superficial cervical adenopathy.  Neurological:     Mental Status: She is alert.    BP 132/82    Pulse 85    Ht _0  (1.575 m)    Wt 154 lb (69.9 kg)    SpO2 97%    BMI 28.17 kg/m  Wt Readings from Last 3 Encounters:  02/17/22 154 lb (69.9 kg)  09/23/21 146 lb (66.2 kg)  08/12/21 138 lb 12 oz (62.9 kg)     Health Maintenance Due  Topic Date Due   Zoster Vaccines- Shingrix (1 of 2) Never done   COVID-19 Vaccine (3 - Booster for Moderna series) 11/16/2020   FOOT EXAM  05/15/2021   INFLUENZA VACCINE  07/27/2021   HEMOGLOBIN A1C  11/05/2021    There are no preventive care reminders to display for this patient.  Lab Results  Component Value Date   TSH 2.16 10/21/2020   Lab Results  Component Value Date   WBC 8.8 10/21/2020   HGB 12.6 10/21/2020   HCT 38.5 10/21/2020   MCV 87.6 10/21/2020   PLT 223.0 10/21/2020   Lab Results  Component Value Date   NA 137 05/21/2021   K 4.0 05/21/2021   CO2 23 05/21/2021   GLUCOSE 126 (H) 05/21/2021   BUN 19 05/21/2021   CREATININE 1.40 (H) 05/21/2021   BILITOT 1.3 (H) 05/05/2021   ALKPHOS 78 05/05/2021   AST 21 05/05/2021   ALT 23 05/05/2021   PROT 6.9 05/05/2021   ALBUMIN 4.4 05/05/2021   CALCIUM 10.1 05/21/2021   ANIONGAP 9 05/01/2020   GFR 37.98 (L) 05/21/2021   Lab Results  Component Value Date   HGBA1C 5.7 05/05/2021       Assessment & Plan:   Problem List Items Addressed This Visit       Respiratory   Bronchitis    Take antibiotic as prescribed. Increase oral fluids. Pt to f/u if sx worsen and or fail to improve in 2-3 days. Z-Pak sent to pharmacy patient to start and take as directed      Relevant Medications   azithromycin (ZITHROMAX) 250 MG tablet   guaiFENesin-codeine (ROBITUSSIN AC) 100-10 MG/5ML syrup     Other   Acute bacterial conjunctivitis of left eye - Primary    Eyedrops prescribed and sent to patient pharmacy Take antibiotic as prescribed. Increase oral fluids. Pt to f/u if sx worsen and or fail to improve in 2-3 days.       Relevant Medications   ofloxacin (OCUFLOX) 0.3 % ophthalmic solution   azithromycin (ZITHROMAX) 250 MG tablet   Acute cough    Patient requested cough medicine sent to pharmacy PDMP reviewed no suspicious activity       Meds ordered this encounter  Medications   ofloxacin (OCUFLOX) 0.3 % ophthalmic solution    Sig: Place 1 drop into the left eye 4 (four) times daily for 7 days.    Dispense:  1.4 mL    Refill:  0    Order Specific Question:   Supervising Provider    Answer:  BEDSOLE, AMY E [[id number]]   azithromycin (ZITHROMAX) 250 MG tablet    Sig: Take 2 tablets on day 1, then 1 tablet daily on days 2 through 5    Dispense:  6 tablet    Refill:  0    Order Specific Question:   Supervising Provider    Answer:   BEDSOLE, AMY E [[id number]]   guaiFENesin-codeine (ROBITUSSIN AC) 100-10 MG/5ML syrup    Sig: Take 5 mLs by mouth 3 (three) times daily as needed for up to 5 days for cough.    Dispense:  75 mL    Refill:  0    Order Specific Question:   Supervising Provider    Answer:   BEDSOLE, AMY E [[id number]]    Follow-up: Return if symptoms worsen or fail to improve.    Eugenia Pancoast, FNP

## 2022-02-17 NOTE — Patient Instructions (Signed)
Antibiotic sent to preferred pharmacy.  Also sent cough medication and eye drop for your eye.   Please increase oral fluids, steamy hot shower/humidifier prn.  Please follow up if no improvement in 2-3 days.   It was a pleasure seeing you today! Please do not hesitate to reach out with any questions and or concerns.  Regards,   Eugenia Pancoast

## 2022-02-18 DIAGNOSIS — H1032 Unspecified acute conjunctivitis, left eye: Secondary | ICD-10-CM | POA: Insufficient documentation

## 2022-02-18 DIAGNOSIS — J40 Bronchitis, not specified as acute or chronic: Secondary | ICD-10-CM | POA: Insufficient documentation

## 2022-02-18 DIAGNOSIS — R051 Acute cough: Secondary | ICD-10-CM | POA: Insufficient documentation

## 2022-02-18 NOTE — Assessment & Plan Note (Signed)
Patient requested cough medicine sent to pharmacy PDMP reviewed no suspicious activity

## 2022-02-18 NOTE — Assessment & Plan Note (Signed)
Eyedrops prescribed and sent to patient pharmacy Take antibiotic as prescribed. Increase oral fluids. Pt to f/u if sx worsen and or fail to improve in 2-3 days.

## 2022-02-18 NOTE — Assessment & Plan Note (Signed)
Take antibiotic as prescribed. Increase oral fluids. Pt to f/u if sx worsen and or fail to improve in 2-3 days. Z-Pak sent to pharmacy patient to start and take as directed

## 2022-03-03 ENCOUNTER — Ambulatory Visit (INDEPENDENT_AMBULATORY_CARE_PROVIDER_SITE_OTHER): Payer: Medicare Other | Admitting: Family Medicine

## 2022-03-03 ENCOUNTER — Encounter: Payer: Self-pay | Admitting: Family Medicine

## 2022-03-03 ENCOUNTER — Other Ambulatory Visit: Payer: Self-pay

## 2022-03-03 VITALS — BP 140/66 | HR 84 | Temp 99.2°F | Ht 62.0 in | Wt 159.3 lb

## 2022-03-03 DIAGNOSIS — R052 Subacute cough: Secondary | ICD-10-CM

## 2022-03-03 DIAGNOSIS — J208 Acute bronchitis due to other specified organisms: Secondary | ICD-10-CM | POA: Diagnosis not present

## 2022-03-03 MED ORDER — LEVOFLOXACIN 500 MG PO TABS: 500.0000 mg | ORAL_TABLET | Freq: Every day | ORAL | 0 refills | Status: AC

## 2022-03-03 MED ORDER — PREDNISONE 20 MG PO TABS: 20.0000 mg | ORAL_TABLET | Freq: Every day | ORAL | 0 refills | Status: DC

## 2022-03-03 NOTE — Progress Notes (Signed)
? ? ?Brenda Contreras T. Brenda Rask, MD, [facility] ?Therapist, music at [facility] ?[place] ?[place], [address] ? ?Phone: [phone]  FAX: [phone] ? ?Brenda Contreras - 71 y.o. female  MRN [id number]  Date of Birth: Apr 19, 1951 ? ?Date: 03/03/2022  PCP: Pleas Koch, NP  Referral: Pleas Koch, NP ? ?Chief Complaint  ?Patient presents with  ? Cough  ?  Seen by Phebe Colla on 02/17/22-"Chest is just burning"  ? Fatigue  ? ? ?This visit occurred during the SARS-CoV-2 public health emergency.  Safety protocols were in place, including screening questions prior to the visit, additional usage of staff PPE, and extensive cleaning of exam room while observing appropriate contact time as indicated for disinfecting solutions.  ? ?Subjective:  ? ?Brenda Contreras is a 72 y.o. very pleasant female patient with Body mass index is 29.14 kg/m?. who presents with the following: ? ?F/u 02/17/2022 with cough and Zpak. ? ?Felt pretty goodfor a while, and then her chest started burning.  Now feeling about the same.  ?Does have some chills. ?99+ at home   99.2 ? ?She is now persistently having her cough since that time.  She did get better for a few days, but then this continues to be persistent she has chills, she has had some low-grade fevers.  She does think that she subjectively has fevers, however she does not have a working thermometer. ? ?She is eating and drinking okay, she is going to the bathroom fine. ? ?Cough is prod, more in the past.  ? ?Lab Results  ?Component Value Date  ? HGBA1C 5.7 05/05/2021  ?  ? ? ?Review of Systems is noted in the HPI, as appropriate ? ?Objective:  ? ?BP 140/66   Pulse 84   Temp 99.2 ?F (37.3 ?C) (Oral)   Ht $R'5\' 2"'QC$  (1.575 m)   Wt 159 lb 5 oz (72.3 kg)   SpO2 97%   BMI 29.14 kg/m?  ? ? ?Gen: WDWN, cooperative. Globally Non-toxic ?HEENT: Normocephalic and atraumatic. Throat clear, w/o exudate, R TM clear, L TM - good landmarks, No fluid present. rhinnorhea. No  frontal or maxillary sinus T. MMM ?NECK: Anterior cervical  LAD is absent  ?CV: RRR, No M/G/R, cap refill <2 sec ?PULM: Breathing comfortably in no respiratory distress. no wheezing, crackles, rhonchi ?MSK: Nml gait  ?Laboratory and Imaging Data: ? ?Assessment and Plan:  ? ?  ICD-10-CM   ?1. Acute bronchitis due to other specified organisms  J20.8   ?  ?2. Subacute cough  R05.2   ?  ? ?Greater than 3 weeks history of persistent cough, productive sputum, low-grade fevers.  I am gonna broaden coverage given the placement some Levaquin.  Having some bronchospasm, and I am going to give her some low-dose steroids. ? ?Continue with other basic supportive care. ? ?Meds ordered this encounter  ?Medications  ? levofloxacin (LEVAQUIN) 500 MG tablet  ?  Sig: Take 1 tablet (500 mg total) by mouth daily for 7 days.  ?  Dispense:  7 tablet  ?  Refill:  0  ? predniSONE (DELTASONE) 20 MG tablet  ?  Sig: Take 1 tablet (20 mg total) by mouth daily with breakfast.  ?  Dispense:  7 tablet  ?  Refill:  0  ? ?Medications Discontinued During This Encounter  ?Medication Reason  ? sitaGLIPtin (JANUVIA) 696 MG tablet Duplicate  ? metFORMIN (GLUCOPHAGE) 2952 MG tablet Duplicate  ? ?No orders of the defined  types were placed in this encounter. ? ? ?Follow-up: No follow-ups on file. ? ?Dragon [employer] One speech-to-text software was used for transcription in this dictation.  Possible transcriptional errors can occur using Editor, commissioning.  ? ?Signed, ? ?Kameron Blethen T. Rafan Sanders, MD ? ? ?Outpatient Encounter Medications as of 03/03/2022  ?Medication Sig  ? acetaminophen (TYLENOL) 500 MG tablet Take 1,000 mg by mouth every 6 (six) hours as needed for mild pain.  ? amLODipine (NORVASC) 10 MG tablet Take 1 tablet (10 mg total) by mouth daily. For blood pressure  ? aspirin EC 81 MG tablet Take 81 mg by mouth daily.  ? Blood Glucose Monitoring Suppl (ONE TOUCH ULTRA 2) w/Device KIT Use as instructed to test blood sugar daily. Dx is E11.9  ? carvedilol  (COREG) 6.25 MG tablet Take 1 tablet (6.25 mg total) by mouth 2 (two) times daily with a meal. For blood pressure.  ? famotidine (PEPCID) 20 MG tablet Take 1 tablet (20 mg total) by mouth 2 (two) times daily. For heartburn.  ? gabapentin (NEURONTIN) 100 MG capsule Take 2 capsules by mouth 3 times daily for 2 weeks, then reduce to 1 capsule 3 times daily for 2 weeks, then reduce to 1 capsule twice daily.  ? glucose blood (ONETOUCH ULTRA) test strip USE TO CHECK BLOOD SUGAR TWICE DAILY AS INSTRUCTED DX E11.9  ? levofloxacin (LEVAQUIN) 500 MG tablet Take 1 tablet (500 mg total) by mouth daily for 7 days.  ? losartan (COZAAR) 50 MG tablet Take 1 tablet (50 mg total) by mouth daily. For blood pressure.  ? metFORMIN (GLUCOPHAGE) 1000 MG tablet Take 1 tablet (1,000 mg total) by mouth 2 (two) times daily with a meal. For diabetes.  ? ONE TOUCH LANCETS MISC Use as instructed to test blood sugar daily. Dx is E11.9  ? predniSONE (DELTASONE) 20 MG tablet Take 1 tablet (20 mg total) by mouth daily with breakfast.  ? rosuvastatin (CRESTOR) 20 MG tablet Take 1 tablet (20 mg total) by mouth at bedtime. For cholesterol.  ? sitaGLIPtin (JANUVIA) 100 MG tablet Take 1 tablet (100 mg total) by mouth daily. For diabetes.  ? [DISCONTINUED] metFORMIN (GLUCOPHAGE) 1000 MG tablet Take 1 tablet (1,000 mg total) by mouth 2 (two) times daily with a meal. For diabetes.  ? [DISCONTINUED] sitaGLIPtin (JANUVIA) 100 MG tablet Take 1 tablet (100 mg total) by mouth daily. For diabetes.  ? ?No facility-administered encounter medications on file as of 03/03/2022.  ?  ?

## 2022-04-27 DIAGNOSIS — Z1231 Encounter for screening mammogram for malignant neoplasm of breast: Secondary | ICD-10-CM | POA: Diagnosis not present

## 2022-04-27 LAB — HM MAMMOGRAPHY

## 2022-05-13 ENCOUNTER — Telehealth: Payer: Self-pay | Admitting: Primary Care

## 2022-05-14 ENCOUNTER — Ambulatory Visit: Payer: Medicare Other

## 2022-05-17 ENCOUNTER — Telehealth: Payer: Self-pay | Admitting: Primary Care

## 2022-05-17 ENCOUNTER — Other Ambulatory Visit (INDEPENDENT_AMBULATORY_CARE_PROVIDER_SITE_OTHER): Payer: Medicare Other

## 2022-05-17 ENCOUNTER — Ambulatory Visit (INDEPENDENT_AMBULATORY_CARE_PROVIDER_SITE_OTHER): Payer: Medicare Other | Admitting: *Deleted

## 2022-05-17 DIAGNOSIS — N289 Disorder of kidney and ureter, unspecified: Secondary | ICD-10-CM

## 2022-05-17 DIAGNOSIS — Z0000 Encounter for general adult medical examination without abnormal findings: Secondary | ICD-10-CM

## 2022-05-17 LAB — BASIC METABOLIC PANEL
BUN: 18 mg/dL (ref 6–23)
CO2: 22 mEq/L (ref 19–32)
Calcium: 9.6 mg/dL (ref 8.4–10.5)
Chloride: 108 mEq/L (ref 96–112)
Creatinine, Ser: 1.23 mg/dL — ABNORMAL HIGH (ref 0.40–1.20)
GFR: 44.06 mL/min — ABNORMAL LOW (ref >60.00)
Glucose, Bld: 198 mg/dL — ABNORMAL HIGH (ref 70–99)
Potassium: 4.8 mEq/L (ref 3.5–5.1)
Sodium: 137 mEq/L (ref 135–145)

## 2022-05-17 NOTE — Patient Instructions (Signed)
Brenda Contreras , Thank you for taking time to come for your Medicare Wellness Visit. I appreciate your ongoing commitment to your health goals. Please review the following plan we discussed and let me know if I can assist you in the future.   Screening recommendations/referrals: Colonoscopy: up to date Mammogram: up to date Bone Density: up to date Recommended yearly ophthalmology/optometry visit for glaucoma screening and checkup Recommended yearly dental visit for hygiene and checkup  Vaccinations: Influenza vaccine: Education provided Pneumococcal vaccine: up to date Tdap vaccine: up to date Shingles vaccine: Education provided    Advanced directives: Education provided  Conditions/risks identified:      Preventive Care 44 Years and Older, Female Preventive care refers to lifestyle choices and visits with your health care provider that can promote health and wellness. What does preventive care include? A yearly physical exam. This is also called an annual well check. Dental exams once or twice a year. Routine eye exams. Ask your health care provider how often you should have your eyes checked. Personal lifestyle choices, including: Daily care of your teeth and gums. Regular physical activity. Eating a healthy diet. Avoiding tobacco and drug use. Limiting alcohol use. Practicing safe sex. Taking low-dose aspirin every day. Taking vitamin and mineral supplements as recommended by your health care provider. What happens during an annual well check? The services and screenings done by your health care provider during your annual well check will depend on your age, overall health, lifestyle risk factors, and family history of disease. Counseling  Your health care provider may ask you questions about your: Alcohol use. Tobacco use. Drug use. Emotional well-being. Home and relationship well-being. Sexual activity. Eating habits. History of falls. Memory and ability to  understand (cognition). Work and work Statistician. Reproductive health. Screening  You may have the following tests or measurements: Height, weight, and BMI. Blood pressure. Lipid and cholesterol levels. These may be checked every 5 years, or more frequently if you are over 54 years old. Skin check. Lung cancer screening. You may have this screening every year starting at age 81 if you have a 30-pack-year history of smoking and currently smoke or have quit within the past 15 years. Fecal occult blood test (FOBT) of the stool. You may have this test every year starting at age 58. Flexible sigmoidoscopy or colonoscopy. You may have a sigmoidoscopy every 5 years or a colonoscopy every 10 years starting at age 22. Hepatitis C blood test. Hepatitis B blood test. Sexually transmitted disease (STD) testing. Diabetes screening. This is done by checking your blood sugar (glucose) after you have not eaten for a while (fasting). You may have this done every 1-3 years. Bone density scan. This is done to screen for osteoporosis. You may have this done starting at age 51. Mammogram. This may be done every 1-2 years. Talk to your health care provider about how often you should have regular mammograms. Talk with your health care provider about your test results, treatment options, and if necessary, the need for more tests. Vaccines  Your health care provider may recommend certain vaccines, such as: Influenza vaccine. This is recommended every year. Tetanus, diphtheria, and acellular pertussis (Tdap, Td) vaccine. You may need a Td booster every 10 years. Zoster vaccine. You may need this after age 18. Pneumococcal 13-valent conjugate (PCV13) vaccine. One dose is recommended after age 41. Pneumococcal polysaccharide (PPSV23) vaccine. One dose is recommended after age 67. Talk to your health care provider about which screenings and vaccines you  need and how often you need them. This information is not  intended to replace advice given to you by your health care provider. Make sure you discuss any questions you have with your health care provider. Document Released: 01/09/2016 Document Revised: 09/01/2016 Document Reviewed: 10/14/2015 Elsevier Interactive Patient Education  2017 [place] Prevention in the Home Falls can cause injuries. They can happen to people of all ages. There are many things you can do to make your home safe and to help prevent falls. What can I do on the outside of my home? Regularly fix the edges of walkways and driveways and fix any cracks. Remove anything that might make you trip as you walk through a door, such as a raised step or threshold. Trim any bushes or trees on the path to your home. Use bright outdoor lighting. Clear any walking paths of anything that might make someone trip, such as rocks or tools. Regularly check to see if handrails are loose or broken. Make sure that both sides of any steps have handrails. Any raised decks and porches should have guardrails on the edges. Have any leaves, snow, or ice cleared regularly. Use sand or salt on walking paths during winter. Clean up any spills in your garage right away. This includes oil or grease spills. What can I do in the bathroom? Use night lights. Install grab bars by the toilet and in the tub and shower. Do not use towel bars as grab bars. Use non-skid mats or decals in the tub or shower. If you need to sit down in the shower, use a plastic, non-slip stool. Keep the floor dry. Clean up any water that spills on the floor as soon as it happens. Remove soap buildup in the tub or shower regularly. Attach bath mats securely with double-sided non-slip rug tape. Do not have throw rugs and other things on the floor that can make you trip. What can I do in the bedroom? Use night lights. Make sure that you have a light by your bed that is easy to reach. Do not use any sheets or blankets that are  too big for your bed. They should not hang down onto the floor. Have a firm chair that has side arms. You can use this for support while you get dressed. Do not have throw rugs and other things on the floor that can make you trip. What can I do in the kitchen? Clean up any spills right away. Avoid walking on wet floors. Keep items that you use a lot in easy-to-reach places. If you need to reach something above you, use a strong step stool that has a grab bar. Keep electrical cords out of the way. Do not use floor polish or wax that makes floors slippery. If you must use wax, use non-skid floor wax. Do not have throw rugs and other things on the floor that can make you trip. What can I do with my stairs? Do not leave any items on the stairs. Make sure that there are handrails on both sides of the stairs and use them. Fix handrails that are broken or loose. Make sure that handrails are as long as the stairways. Check any carpeting to make sure that it is firmly attached to the stairs. Fix any carpet that is loose or worn. Avoid having throw rugs at the top or bottom of the stairs. If you do have throw rugs, attach them to the floor with carpet tape. Make sure that  you have a light switch at the top of the stairs and the bottom of the stairs. If you do not have them, ask someone to add them for you. What else can I do to help prevent falls? Wear shoes that: Do not have high heels. Have rubber bottoms. Are comfortable and fit you well. Are closed at the toe. Do not wear sandals. If you use a stepladder: Make sure that it is fully opened. Do not climb a closed stepladder. Make sure that both sides of the stepladder are locked into place. Ask someone to hold it for you, if possible. Clearly mark and make sure that you can see: Any grab bars or handrails. First and last steps. Where the edge of each step is. Use tools that help you move around (mobility aids) if they are needed. These  include: Canes. Walkers. Scooters. Crutches. Turn on the lights when you go into a dark area. Replace any light bulbs as soon as they burn out. Set up your furniture so you have a clear path. Avoid moving your furniture around. If any of your floors are uneven, fix them. If there are any pets around you, be aware of where they are. Review your medicines with your doctor. Some medicines can make you feel dizzy. This can increase your chance of falling. Ask your doctor what other things that you can do to help prevent falls. This information is not intended to replace advice given to you by your health care provider. Make sure you discuss any questions you have with your health care provider. Document Released: 10/09/2009 Document Revised: 05/20/2016 Document Reviewed: 01/17/2015 Elsevier Interactive Patient Education  2017 [employer].

## 2022-05-17 NOTE — Telephone Encounter (Signed)
Pt called and stated the nurse left her a message and she was returning the call to the office. She was last seen by Anda Kraft on 09/23/2021. Please return the call when possible.  Callback Number: [phone]

## 2022-05-17 NOTE — Progress Notes (Signed)
Subjective:   Brenda Contreras is a 71 y.o. female who presents for Medicare Annual (Subsequent) preventive examination.  I connected with  Brenda Contreras on 05/17/22 by a telephone enabled telemedicine application and verified that I am speaking with the correct person using two identifiers.   I discussed the limitations of evaluation and management by telemedicine. The patient expressed understanding and agreed to proceed.  Patient location: home  Provider location: in office    Review of Systems     Cardiac Risk Factors include: diabetes mellitus;advanced age (>19mn, >>55women);hypertension;sedentary lifestyle     Objective:    Today's Vitals   There is no height or weight on file to calculate BMI.     05/17/2022    1:08 PM 05/13/2021    9:48 AM 05/12/2020    9:48 AM 05/09/2019   10:59 AM 05/04/2018   10:58 AM 01/28/2018    8:44 AM  Advanced Directives  Does Patient Have a Medical Advance Directive? No Yes Yes Yes No No  Type of ACorporate treasurerof [place]Living will [place]Living will [place] will    Copy of [place] Chart?  No - copy requested No - copy requested No - copy requested    Would patient like information on creating a medical advance directive? No - Patient declined    Yes (MAU/Ambulatory/Procedural Areas - Information given) Yes (ED - Information included in AVS)    Current Medications (verified) Outpatient Encounter Medications as of 05/17/2022  Medication Sig   amLODipine (NORVASC) 10 MG tablet Take 1 tablet (10 mg total) by mouth daily. For blood pressure   aspirin EC 81 MG tablet Take 81 mg by mouth daily.   Blood Glucose Monitoring Suppl (ONE TOUCH ULTRA 2) w/Device KIT Use as instructed to test blood sugar daily. Dx is E11.9   carvedilol (COREG) 6.25 MG tablet Take 1 tablet (6.25 mg total) by mouth 2 (two) times daily with a meal. For blood pressure.    famotidine (PEPCID) 20 MG tablet Take 1 tablet (20 mg total) by mouth 2 (two) times daily. For heartburn.   glucose blood (ONETOUCH ULTRA) test strip USE TO CHECK BLOOD SUGAR TWICE DAILY AS INSTRUCTED DX E11.9   losartan (COZAAR) 50 MG tablet Take 1 tablet (50 mg total) by mouth daily. For blood pressure.   metFORMIN (GLUCOPHAGE) 1000 MG tablet Take 1 tablet (1,000 mg total) by mouth 2 (two) times daily with a meal. For diabetes.   ONE TOUCH LANCETS MISC Use as instructed to test blood sugar daily. Dx is E11.9   rosuvastatin (CRESTOR) 20 MG tablet Take 1 tablet (20 mg total) by mouth at bedtime. For cholesterol.   sitaGLIPtin (JANUVIA) 100 MG tablet Take 1 tablet (100 mg total) by mouth daily. For diabetes.   sitaGLIPtin-metformin (JANUMET) 50-1000 MG tablet Take 1 tablet by mouth 2 (two) times daily with a meal.   acetaminophen (TYLENOL) 500 MG tablet Take 1,000 mg by mouth every 6 (six) hours as needed for mild pain.   gabapentin (NEURONTIN) 100 MG capsule Take 2 capsules by mouth 3 times daily for 2 weeks, then reduce to 1 capsule 3 times daily for 2 weeks, then reduce to 1 capsule twice daily.   predniSONE (DELTASONE) 20 MG tablet Take 1 tablet (20 mg total) by mouth daily with breakfast.   No facility-administered encounter medications on file as of 05/17/2022.    Allergies (verified) Patient has  no known allergies.   History: 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 colon   Dizziness    GERD (gastroesophageal reflux disease)    High cholesterol    Hypertension    Vaginal dryness    Past Surgical History:  Procedure Laterality Date   APPENDECTOMY     BREAST BIOPSY     TUBAL LIGATION     Family History  Problem Relation Age of Onset   Diabetes Father    Hypertension Father    Hyperlipidemia Father    Stroke Father    Diabetes Sister    Hyperlipidemia Sister    Hypertension Sister     Lung cancer Paternal Uncle    Diabetes Paternal Grandmother    Heart disease Paternal Grandfather    Social History   Socioeconomic History   Marital status: Widowed    Spouse name: Not on file   Number of children: Not on file   Years of education: Not on file   Highest education level: Not on file  Occupational History   Not on file  Tobacco Use   Smoking status: Never   Smokeless tobacco: Never  Vaping Use   Vaping Use: Never used  Substance and Sexual Activity   Alcohol use: No   Drug use: No   Sexual activity: Not Currently  Other Topics Concern   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      Has two children and two grandchildren.      Does not have a living will.  Desires CPR, would not want prolonged life support if futile.   Social Determinants of Health   Financial Resource Strain: Low Risk    Difficulty of Paying Living Expenses: Not hard at all  Food Insecurity: Not on file  Transportation Needs: No Transportation Needs   Lack of Transportation (Medical): No   Lack of Transportation (Non-Medical): No  Physical Activity: Inactive   Days of Exercise per Week: 0 days   Minutes of Exercise per Session: 0 min  Stress: No Stress Concern Present   Feeling of Stress : Not at all  Social Connections: Moderately Integrated   Frequency of Communication with Friends and Family: Three times a week   Frequency of Social Gatherings with Friends and Family: Twice a week   Attends Religious Services: More than 4 times per year   Active Member of [employer] or Organizations: Yes   Attends Archivist Meetings: More than 4 times per year   Marital Status: Widowed    Tobacco Counseling Counseling given: Not Answered   Clinical Intake:  Pre-visit preparation completed: Yes  Pain : No/denies pain     Nutritional Risks: None Diabetes: Yes CBG done?: No Did pt. bring in CBG monitor from home?: No  How often do you  need to have someone help you when you read instructions, pamphlets, or other written materials from your doctor or pharmacy?: 1 - Never  Diabetic?  Yes  Nutrition Risk Assessment:  Has the patient had any N/V/D within the last 2 months?  No  Does the patient have any non-healing wounds?  No  Has the patient had any unintentional weight loss or weight gain?  No   Diabetes:  Is the patient diabetic?  Yes  If diabetic, was a CBG obtained today?  No  Did  the patient bring in their glucometer from home?  No  How often do you monitor your CBG's? 2 x a day.   Financial Strains and Diabetes Management:  Are you having any financial strains with the device, your supplies or your medication? No .  Does the patient want to be seen by Chronic Care Management for management of their diabetes?  No  Would the patient like to be referred to a Nutritionist or for Diabetic Management?  No   Diabetic Exams  Diabetic Eye Exam: Completed Overdue for diabetic eye exam. Pt has been advised about the importance in completing this exam.   Diabetic Foot Exam:  Pt has been advised about the importance in completing this exam.    Interpreter Needed?: No  Information entered by :: Leroy Kennedy LPN   Activities of Daily Living    05/17/2022    1:16 PM  In your present state of health, do you have any difficulty performing the following activities:  Hearing? 0  Vision? 0  Difficulty concentrating or making decisions? 0  Walking or climbing stairs? 0  Dressing or bathing? 0  Doing errands, shopping? 0  Preparing Food and eating ? N  Using the Toilet? N  In the past six months, have you accidently leaked urine? N  Do you have problems with loss of bowel control? N  Managing your Medications? N  Managing your Finances? N  Housekeeping or managing your Housekeeping? N    Patient Care Team: Pleas Koch, NP as PCP - General (Internal Medicine) Adrian Prows, MD as Consulting Physician  (Cardiology) Marylynn Pearson, MD as Consulting Physician (Obstetrics and Gynecology) Warden Fillers, MD as Consulting Physician (Ophthalmology)  Indicate any recent Medical Services you may have received from other than [facility] providers in the past year (date may be approximate).     Assessment:   This is a routine wellness examination for Debbrah.  Hearing/Vision screen Hearing Screening - Comments:: No trouble hearing Vision Screening - Comments:: Up to date  Groat  Dietary issues and exercise activities discussed: Current Exercise Habits: The patient does not participate in regular exercise at present   Goals Addressed             This Visit's Progress    Weight (lb) < 200 lb (90.7 kg)         Depression Screen    05/17/2022    1:13 PM 05/13/2021    9:49 AM 05/05/2021   11:18 AM 10/21/2020    9:38 AM 05/12/2020    9:49 AM 05/11/2019   10:08 AM 05/09/2019   10:50 AM  PHQ 2/9 Scores  PHQ - 2 Score 0 0 0 0 0 0 3  PHQ- 9 Score  0 0 3 0 0 11    Fall Risk    05/17/2022    1:08 PM 05/13/2021    9:49 AM 05/12/2020    9:49 AM 05/09/2019   10:50 AM 05/04/2018   10:33 AM  Fall Risk   Falls in the past year? 0 0 0 0 No  Number falls in past yr: 0 0 0    Injury with Fall? 0 0 0    Risk for fall due to :  Medication side effect Medication side effect    Follow up Falls evaluation completed;Education provided;Falls prevention discussed Falls evaluation completed;Falls prevention discussed Falls evaluation completed;Falls prevention discussed      FALL RISK PREVENTION PERTAINING TO THE HOME:  Any stairs in or around the  home? No  If so, are there any without handrails? No  Home free of loose throw rugs in walkways, pet beds, electrical cords, etc? Yes  Adequate lighting in your home to reduce risk of falls? Yes   ASSISTIVE DEVICES UTILIZED TO PREVENT FALLS:  Life alert? No  Use of a cane, walker or w/c? No  Grab bars in the bathroom? No  Shower chair or bench in shower?  No  Elevated toilet seat or a handicapped toilet? No   TIMED UP AND GO:  Was the test performed? No .    Cognitive Function:    05/13/2021    9:55 AM 05/12/2020    9:51 AM 05/09/2019    6:00 PM 05/04/2018   10:33 AM  MMSE - Mini Mental State Exam  Orientation to time '5 5 5 5  ' Orientation to Place '5 5 5 5  ' Registration '3 3 3 3  ' Attention/ Calculation 5 5 0 0  Recall '3 3 3 3  ' Language- name 2 objects   0 0  Language- repeat '1 1 1 1  ' Language- follow 3 step command   0 3  Language- read & follow direction   0 0  Write a sentence   0 0  Copy design   0 0  Total score   17 20        05/17/2022    1:09 PM  6CIT Screen  What Year? 0 points  What month? 0 points  What time? 0 points  Count back from 20 0 points  Months in reverse 0 points  Repeat phrase 0 points  Total Score 0 points    Immunizations Immunization History  Administered Date(s) Administered   Fluad Quad(high Dose 65+) 09/18/2019, 10/22/2020   Influenza,inj,Quad PF,6+ Mos 09/30/2016, 09/15/2017, 02/08/2019   Influenza-Unspecified 07/27/2013, 02/08/2019   Moderna Sars-Covid-2 Vaccination 08/25/2020, 09/21/2020   Pneumococcal Conjugate-13 04/20/2016   Pneumococcal Polysaccharide-23 04/25/2017   Pneumococcal-Unspecified 07/27/2013   Tdap 03/27/2012, 04/26/2016   Zoster, Live 07/12/2016    TDAP status: Up to date  Flu Vaccine status: Due, Education has been provided regarding the importance of this vaccine. Advised may receive this vaccine at local pharmacy or Health Dept. Aware to provide a copy of the vaccination record if obtained from local pharmacy or Health Dept. Verbalized acceptance and understanding.  Pneumococcal vaccine status: Up to date  Covid-19 vaccine status: Information provided on how to obtain vaccines.   Qualifies for Shingles Vaccine? Yes   Zostavax completed Yes   Shingrix Completed?: No.    Education has been provided regarding the importance of this vaccine. Patient has been  advised to call insurance company to determine out of pocket expense if they have not yet received this vaccine. Advised may also receive vaccine at local pharmacy or Health Dept. Verbalized acceptance and understanding.  Screening Tests Health Maintenance  Topic Date Due   FOOT EXAM  05/15/2021   HEMOGLOBIN A1C  11/05/2021   COVID-19 Vaccine (3 - Booster for Moderna series) 06/02/2022 (Originally 11/16/2020)   Zoster Vaccines- Shingrix (1 of 2) 08/17/2022 (Originally 05/18/2000)   OPHTHALMOLOGY EXAM  06/17/2022   INFLUENZA VACCINE  07/27/2022   MAMMOGRAM  03/05/2023   COLONOSCOPY (Pts 45-24yr Insurance coverage will need to be confirmed)  06/12/2025   TETANUS/TDAP  04/26/2026   Pneumonia Vaccine 72 Years old  Completed   DEXA SCAN  Completed   Hepatitis C Screening  Completed   HPV VACCINES  Aged Out    Health Maintenance  Health Maintenance Due  Topic Date Due   FOOT EXAM  05/15/2021   HEMOGLOBIN A1C  11/05/2021    Colorectal cancer screening: Type of screening: Colonoscopy. Completed 2019. Repeat every 7 years  Mammogram status: Completed  . Repeat every year  Bone Density  patient is scheduled 07-14-2022  Lung Cancer Screening: (Low Dose CT Chest recommended if Age 81-80 years, 30 pack-year currently smoking OR have quit w/in 15years.) does not qualify.   Lung Cancer Screening Referral:   Additional Screening:  Hepatitis C Screening: does not qualify; Completed 2017  Vision Screening: Recommended annual ophthalmology exams for early detection of glaucoma and other disorders of the eye. Is the patient up to date with their annual eye exam?  Yes  Who is the provider or what is the name of the office in which the patient attends annual eye exams? Dr. Katy Fitch If pt is not established with a provider, would they like to be referred to a provider to establish care? No .   Dental Screening: Recommended annual dental exams for proper oral hygiene  Community Resource  Referral / Chronic Care Management: CRR required this visit?  No   CCM required this visit?  No      Plan:     I have personally reviewed and noted the following in the patient's chart:   Medical and social history Use of alcohol, tobacco or illicit drugs  Current medications and supplements including opioid prescriptions.  Functional ability and status Nutritional status Physical activity Advanced directives List of other physicians Hospitalizations, surgeries, and ER visits in previous 12 months Vitals Screenings to include cognitive, depression, and falls Referrals and appointments  In addition, I have reviewed and discussed with patient certain preventive protocols, quality metrics, and best practice recommendations. A written personalized care plan for preventive services as well as general preventive health recommendations were provided to patient.     Leroy Kennedy, LPN   05/27/5614   Nurse Notes:

## 2022-05-18 NOTE — Telephone Encounter (Signed)
Pt called back and I made her aware of message:  Francella Solian, [facility]  05/17/2022  3:57 PM EDT Back to Top    Left message to return call to our office.    Pleas Koch, NP  05/17/2022  3:18 PM EDT     Please call patient:   Kidney function is improved. She is due for her general follow up/CPE in the office with me. Please schedule.    Pt is aware and scheduled CPE

## 2022-06-03 ENCOUNTER — Ambulatory Visit (INDEPENDENT_AMBULATORY_CARE_PROVIDER_SITE_OTHER)
Admission: RE | Admit: 2022-06-03 | Discharge: 2022-06-03 | Disposition: A | Discharge status: Home / Self Care | Payer: Medicare Other | Source: Ambulatory Visit | Attending: Primary Care | Admitting: Primary Care

## 2022-06-03 ENCOUNTER — Encounter: Payer: Self-pay | Admitting: Primary Care

## 2022-06-03 ENCOUNTER — Ambulatory Visit (INDEPENDENT_AMBULATORY_CARE_PROVIDER_SITE_OTHER): Payer: Medicare Other | Admitting: Primary Care

## 2022-06-03 VITALS — BP 134/78 | HR 75 | Temp 98.7°F | Ht 62.0 in | Wt 161.0 lb

## 2022-06-03 DIAGNOSIS — E1165 Type 2 diabetes mellitus with hyperglycemia: Secondary | ICD-10-CM | POA: Diagnosis not present

## 2022-06-03 DIAGNOSIS — M79644 Pain in right finger(s): Secondary | ICD-10-CM

## 2022-06-03 DIAGNOSIS — N281 Cyst of kidney, acquired: Secondary | ICD-10-CM

## 2022-06-03 DIAGNOSIS — Z0001 Encounter for general adult medical examination with abnormal findings: Secondary | ICD-10-CM | POA: Diagnosis not present

## 2022-06-03 DIAGNOSIS — E785 Hyperlipidemia, unspecified: Secondary | ICD-10-CM

## 2022-06-03 DIAGNOSIS — K219 Gastro-esophageal reflux disease without esophagitis: Secondary | ICD-10-CM

## 2022-06-03 DIAGNOSIS — M19041 Primary osteoarthritis, right hand: Secondary | ICD-10-CM | POA: Diagnosis not present

## 2022-06-03 DIAGNOSIS — I10 Essential (primary) hypertension: Secondary | ICD-10-CM

## 2022-06-03 DIAGNOSIS — N1831 Chronic kidney disease, stage 3a: Secondary | ICD-10-CM | POA: Diagnosis not present

## 2022-06-03 LAB — COMPREHENSIVE METABOLIC PANEL
ALT: 27 U/L (ref 0–35)
AST: 19 U/L (ref 0–37)
Albumin: 4.4 g/dL (ref 3.5–5.2)
Alkaline Phosphatase: 66 U/L (ref 39–117)
BUN: 18 mg/dL (ref 6–23)
CO2: 24 mEq/L (ref 19–32)
Calcium: 9.8 mg/dL (ref 8.4–10.5)
Chloride: 105 mEq/L (ref 96–112)
Creatinine, Ser: 1.06 mg/dL (ref 0.40–1.20)
GFR: 52.65 mL/min — ABNORMAL LOW (ref >60.00)
Glucose, Bld: 137 mg/dL — ABNORMAL HIGH (ref 70–99)
Potassium: 5 mEq/L (ref 3.5–5.1)
Sodium: 139 mEq/L (ref 135–145)
Total Bilirubin: 1.6 mg/dL — ABNORMAL HIGH (ref 0.2–1.2)
Total Protein: 6.5 g/dL (ref 6.0–8.3)

## 2022-06-03 LAB — CBC
HCT: 36.8 % (ref 36.0–46.0)
Hemoglobin: 11.9 g/dL — ABNORMAL LOW (ref 12.0–15.0)
MCHC: 32.4 g/dL (ref 30.0–36.0)
MCV: 89.8 fl (ref 78.0–100.0)
Platelets: 192 10*3/uL (ref 150.0–400.0)
RBC: 4.09 Mil/uL (ref 3.87–5.11)
RDW: 14.6 % (ref 11.5–15.5)
WBC: 7 10*3/uL (ref 4.0–10.5)

## 2022-06-03 LAB — LIPID PANEL
Cholesterol: 150 mg/dL (ref 0–200)
HDL: 54.7 mg/dL (ref >39.00)
NonHDL: 95.48
Total CHOL/HDL Ratio: 3
Triglycerides: 228 mg/dL — ABNORMAL HIGH (ref 0.0–149.0)
VLDL: 45.6 mg/dL — ABNORMAL HIGH (ref 0.0–40.0)

## 2022-06-03 LAB — LDL CHOLESTEROL, DIRECT: Direct LDL: 69 mg/dL

## 2022-06-03 LAB — HEMOGLOBIN A1C: Hgb A1c MFr Bld: 8.1 % — ABNORMAL HIGH (ref 4.6–6.5)

## 2022-06-03 LAB — URIC ACID: Uric Acid, Serum: 4.3 mg/dL (ref 2.4–7.0)

## 2022-06-03 NOTE — Assessment & Plan Note (Signed)
Repeat lipid panel pending. ? ?Continue rosuvastatin 20 mg daily. ?

## 2022-06-03 NOTE — Assessment & Plan Note (Signed)
Suspect arthritis, but will rule out other causes.  X-ray pending of right hand. Checking uric acid level.  Discussed to avoid oral NSAIDs given her history of CKD. We discussed use of topical Voltaren gel and oral Tylenol.  She will also set up a visit with our sports medicine doctor

## 2022-06-03 NOTE — Assessment & Plan Note (Signed)
Discussed to avoid recurrent NSAID use. Repeat renal function pending.

## 2022-06-03 NOTE — Assessment & Plan Note (Signed)
Immunizations UTD. Mammogram up-to-date per patient. Bone density scan scheduled. Colonoscopy up-to-date, due 2026.  Discussed the importance of a healthy diet and regular exercise in order for weight loss, and to reduce the risk of further co-morbidity.  Exam as noted. Labs pending.  Follow up in 1 year for repeat physical.

## 2022-06-03 NOTE — Assessment & Plan Note (Signed)
Repeat A1C pending.  Continue sitagliptan-metformin 50-1000 BID.  Pneumonia vaccine UTD. Managed on statin and ARB. Foot exam today.  Follow-up in 3 to 6 months depending on A1c result.

## 2022-06-03 NOTE — Patient Instructions (Addendum)
Stop by the lab and xray prior to leaving today. I will notify you of your results once received.   You can try using diclofenac (Voltaren) gel to your thumb for pain.  Please schedule an appointment with our sports medicine doctor, Dr. Lorelei Pont.  Complete your bone density scan as discussed.  It was a pleasure to see you today!  Preventive Care 47 Years and Older, Female Preventive care refers to lifestyle choices and visits with your health care provider that can promote health and wellness. Preventive care visits are also called wellness exams. What can I expect for my preventive care visit? Counseling Your health care provider may ask you questions about your: Medical history, including: Past medical problems. Family medical history. Pregnancy and menstrual history. History of falls. Current health, including: Memory and ability to understand (cognition). Emotional well-being. Home life and relationship well-being. Sexual activity and sexual health. Lifestyle, including: Alcohol, nicotine or tobacco, and drug use. Access to firearms. Diet, exercise, and sleep habits. Work and work Statistician. Sunscreen use. Safety issues such as seatbelt and bike helmet use. Physical exam Your health care provider will check your: Height and weight. These may be used to calculate your BMI (body mass index). BMI is a measurement that tells if you are at a healthy weight. Waist circumference. This measures the distance around your waistline. This measurement also tells if you are at a healthy weight and may help predict your risk of certain diseases, such as type 2 diabetes and high blood pressure. Heart rate and blood pressure. Body temperature. Skin for abnormal spots. What immunizations do I need?  Vaccines are usually given at various ages, according to a schedule. Your health care provider will recommend vaccines for you based on your age, medical history, and lifestyle or other  factors, such as travel or where you work. What tests do I need? Screening Your health care provider may recommend screening tests for certain conditions. This may include: Lipid and cholesterol levels. Hepatitis C test. Hepatitis B test. HIV (human immunodeficiency virus) test. STI (sexually transmitted infection) testing, if you are at risk. Lung cancer screening. Colorectal cancer screening. Diabetes screening. This is done by checking your blood sugar (glucose) after you have not eaten for a while (fasting). Mammogram. Talk with your health care provider about how often you should have regular mammograms. BRCA-related cancer screening. This may be done if you have a family history of breast, ovarian, tubal, or peritoneal cancers. Bone density scan. This is done to screen for osteoporosis. Talk with your health care provider about your test results, treatment options, and if necessary, the need for more tests. Follow these instructions at home: Eating and drinking  Eat a diet that includes fresh fruits and vegetables, whole grains, lean protein, and low-fat dairy products. Limit your intake of foods with high amounts of sugar, saturated fats, and salt. Take vitamin and mineral supplements as recommended by your health care provider. Do not drink alcohol if your health care provider tells you not to drink. If you drink alcohol: Limit how much you have to 0-1 drink a day. Know how much alcohol is in your drink. In the [place] one drink equals one 12 oz bottle of beer (355 mL), one 5 oz glass of wine (148 mL), or one 1 oz glass of hard liquor (44 mL). Lifestyle Brush your teeth every morning and night with fluoride toothpaste. Floss one time each day. Exercise for at least 30 minutes 5 or more days each week.  Do not use any products that contain nicotine or tobacco. These products include cigarettes, chewing tobacco, and vaping devices, such as e-cigarettes. If you need help quitting, ask  your health care provider. Do not use drugs. If you are sexually active, practice safe sex. Use a condom or other form of protection in order to prevent STIs. Take aspirin only as told by your health care provider. Make sure that you understand how much to take and what form to take. Work with your health care provider to find out whether it is safe and beneficial for you to take aspirin daily. Ask your health care provider if you need to take a cholesterol-lowering medicine (statin). Find healthy ways to manage stress, such as: Meditation, yoga, or listening to music. Journaling. Talking to a trusted person. Spending time with friends and family. Minimize exposure to UV radiation to reduce your risk of skin cancer. Safety Always wear your seat belt while driving or riding in a vehicle. Do not drive: If you have been drinking alcohol. Do not ride with someone who has been drinking. When you are tired or distracted. While texting. If you have been using any mind-altering substances or drugs. Wear a helmet and other protective equipment during sports activities. If you have firearms in your house, make sure you follow all gun safety procedures. What's next? Visit your health care provider once a year for an annual wellness visit. Ask your health care provider how often you should have your eyes and teeth checked. Stay up to date on all vaccines. This information is not intended to replace advice given to you by your health care provider. Make sure you discuss any questions you have with your health care provider. Document Revised: 06/10/2021 Document Reviewed: 06/10/2021 Elsevier Patient Education  [employer].

## 2022-06-03 NOTE — Assessment & Plan Note (Signed)
Controlled.  Continue famotidine 20 mg BID. 

## 2022-06-03 NOTE — Progress Notes (Signed)
Subjective:    Patient ID: Brenda Contreras, female    DOB: 1951/06/14, 71 y.o.   MRN: [id number]  HPI  Brenda Contreras is a very pleasant 71 y.o. female who presents today for complete physical and follow up of chronic conditions.  She would also like to discuss acute right hand pain.  Acute for the last month, located primarily to the thumb, entire thumb with occasional radiation to right medial wrist.  She denies injury, swelling, erythema, known history of gout, overuse with right hand.  She has also noticed decrease in range of motion with pain.  She has taken ibuprofen a few times with improvement in pain.  Immunizations: -Tetanus: 2017 -Influenza: Completed last season -Covid-19: 2 vaccines -Shingles: Completed Zostavax -Pneumonia: Prevnar 13 in 2017, Pneumovax 23 in 2018  Diet: [place].  Exercise: No regular exercise.  Eye exam: Completes annually  Dental exam: Completes semi-annually   Mammogram: Completed in Spring 2023  Colonoscopy: Completed in 2019, due 2026 Dexa: Completed in 2020, due. Scheduled.    BP Readings from Last 3 Encounters:  06/03/22 134/78  03/03/22 140/66  02/17/22 132/82       Review of Systems  Constitutional:  Negative for unexpected weight change.  HENT:  Negative for rhinorrhea.   Respiratory:  Negative for cough and shortness of breath.   Cardiovascular:  Negative for chest pain.  Gastrointestinal:  Negative for constipation and diarrhea.  Genitourinary:  Negative for difficulty urinating.  Musculoskeletal:  Positive for arthralgias. Negative for myalgias.  Skin:  Negative for rash.  Allergic/Immunologic: Negative for environmental allergies.  Neurological:  Negative for dizziness and headaches.  Psychiatric/Behavioral:  The patient is not nervous/anxious.          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 colon   Dizziness     GERD (gastroesophageal reflux disease)    High cholesterol    Hypertension    Right hip pain 05/27/2021   Vaginal dryness     Social History   Socioeconomic History   Marital status: Widowed    Spouse name: Not on file   Number of children: Not on file   Years of education: Not on file   Highest education level: Not on file  Occupational History   Not on file  Tobacco Use   Smoking status: Never   Smokeless tobacco: Never  Vaping Use   Vaping Use: Never used  Substance and Sexual Activity   Alcohol use: No   Drug use: No   Sexual activity: Not Currently  Other Topics Concern   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      Has two children and two grandchildren.      Does not have a living will.  Desires CPR, would not want prolonged life support if futile.   Social Determinants of Health   Financial Resource Strain: Low Risk  (05/17/2022)   Overall Financial Resource Strain (CARDIA)    Difficulty of Paying Living Expenses: Not hard at all  Food Insecurity: Not on file  Transportation Needs: No Transportation Needs (05/17/2022)   PRAPARE - Hydrologist (Medical): No    Lack of Transportation (Non-Medical): No  Physical Activity: Inactive (05/17/2022)   Exercise Vital Sign    Days of Exercise per Week: 0 days  Minutes of Exercise per Session: 0 min  Stress: No Stress Concern Present (05/17/2022)   [place]    Feeling of Stress : Not at all  Social Connections: Moderately Integrated (05/17/2022)   Social Connection and Isolation Panel [NHANES]    Frequency of Communication with Friends and Family: Three times a week    Frequency of Social Gatherings with Friends and Family: Twice a week    Attends Religious Services: More than 4 times per year    Active Member of [employer] or Organizations: Yes    Attends Archivist Meetings: More  than 4 times per year    Marital Status: Widowed  Intimate Partner Violence: Not At Risk (05/17/2022)   Humiliation, Afraid, Rape, and Kick questionnaire    Fear of Current or Ex-Partner: No    Emotionally Abused: No    Physically Abused: No    Sexually Abused: No    Past Surgical History:  Procedure Laterality Date   APPENDECTOMY     BREAST BIOPSY     TUBAL LIGATION      Family History  Problem Relation Age of Onset   Diabetes Father    Hypertension Father    Hyperlipidemia Father    Stroke Father    Diabetes Sister    Hyperlipidemia Sister    Hypertension Sister    Lung cancer Paternal Uncle    Diabetes Paternal Grandmother    Heart disease Paternal Grandfather     No Known Allergies  Current Outpatient Medications on File Prior to Visit  Medication Sig Dispense Refill   amLODipine (NORVASC) 10 MG tablet Take 1 tablet (10 mg total) by mouth daily. For blood pressure 90 tablet 3   aspirin EC 81 MG tablet Take 81 mg by mouth daily.     Blood Glucose Monitoring Suppl (ONE TOUCH ULTRA 2) w/Device KIT Use as instructed to test blood sugar daily. Dx is E11.9 1 each 0   carvedilol (COREG) 6.25 MG tablet Take 1 tablet (6.25 mg total) by mouth 2 (two) times daily with a meal. For blood pressure. 180 tablet 3   famotidine (PEPCID) 20 MG tablet Take 1 tablet (20 mg total) by mouth 2 (two) times daily. For heartburn. 180 tablet 3   glucose blood (ONETOUCH ULTRA) test strip USE TO CHECK BLOOD SUGAR TWICE DAILY AS INSTRUCTED DX E11.9 200 each 1   losartan (COZAAR) 50 MG tablet Take 1 tablet (50 mg total) by mouth daily. For blood pressure. 90 tablet 3   ONE TOUCH LANCETS MISC Use as instructed to test blood sugar daily. Dx is E11.9 200 each 2   rosuvastatin (CRESTOR) 20 MG tablet Take 1 tablet (20 mg total) by mouth at bedtime. For cholesterol. 90 tablet 3   sitaGLIPtin-metformin (JANUMET) 50-1000 MG tablet Take 1 tablet by mouth 2 (two) times daily with a meal.     No current  facility-administered medications on file prior to visit.    BP 134/78   Pulse 75   Temp 98.7 F (37.1 C) (Oral)   Ht _0  (1.575 m)   Wt 161 lb (73 kg)   SpO2 97%   BMI 29.45 kg/m  Objective:   Physical Exam HENT:     Right Ear: Tympanic membrane and ear canal normal.     Left Ear: Tympanic membrane and ear canal normal.     Nose: Nose normal.  Eyes:     Conjunctiva/sclera: Conjunctivae normal.  Pupils: Pupils are equal, round, and reactive to light.  Neck:     Thyroid: No thyromegaly.  Cardiovascular:     Rate and Rhythm: Normal rate and regular rhythm.     Heart sounds: No murmur heard. Pulmonary:     Effort: Pulmonary effort is normal.     Breath sounds: Normal breath sounds. No rales.  Abdominal:     General: Bowel sounds are normal.     Palpations: Abdomen is soft.     Tenderness: There is no abdominal tenderness.  Musculoskeletal:     Right hand: No swelling. Decreased range of motion. Normal strength.     Cervical back: Neck supple.     Comments: Decrease in range of motion with pain to right thumb with flexion.  No swelling or erythema, no warmth.  Lymphadenopathy:     Cervical: No cervical adenopathy.  Skin:    General: Skin is warm and dry.     Findings: No rash.  Neurological:     Mental Status: She is alert and oriented to person, place, and time.     Cranial Nerves: No cranial nerve deficit.     Deep Tendon Reflexes: Reflexes are normal and symmetric.  Psychiatric:        Mood and Affect: Mood normal.           Assessment & Plan:   Problem List Items Addressed This Visit       Cardiovascular and Mediastinum   HTN (hypertension)    Controlled.  Continue carvedilol 6.25 mg BID, losartan 50 mg daily, amlodipine 10 mg daily. CMP pending.      Relevant Orders   Comprehensive metabolic panel   CBC     Digestive   GERD (gastroesophageal reflux disease)    Controlled.  Continue famotidine 20 mg BID.        Endocrine   Type 2  diabetes mellitus with hyperglycemia (HCC)    Repeat A1C pending.  Continue sitagliptan-metformin 50-1000 BID.  Pneumonia vaccine UTD. Managed on statin and ARB. Foot exam today.  Follow-up in 3 to 6 months depending on A1c result.      Relevant Orders   Hemoglobin A1c     Genitourinary   Stage 3a chronic kidney disease ([place])    Discussed to avoid recurrent NSAID use. Repeat renal function pending.      Acquired cyst of kidney    Reviewed MRI from June 2021, cyst determined to be benign.        Other   HLD (hyperlipidemia)    Repeat lipid panel pending. Continue rosuvastatin 20 mg daily.      Relevant Orders   Lipid panel   Encounter for annual general medical examination with abnormal findings in adult - Primary    Immunizations UTD. Mammogram up-to-date per patient. Bone density scan scheduled. Colonoscopy up-to-date, due 2026.  Discussed the importance of a healthy diet and regular exercise in order for weight loss, and to reduce the risk of further co-morbidity.  Exam as noted. Labs pending.  Follow up in 1 year for repeat physical.       Thumb pain, right    Suspect arthritis, but will rule out other causes.  X-ray pending of right hand. Checking uric acid level.  Discussed to avoid oral NSAIDs given her history of CKD. We discussed use of topical Voltaren gel and oral Tylenol.  She will also set up a visit with our sports medicine doctor      Relevant Orders  DG Hand Complete Right   Uric acid       Pleas Koch, NP

## 2022-06-03 NOTE — Assessment & Plan Note (Signed)
Controlled.  Continue carvedilol 6.25 mg BID, losartan 50 mg daily, amlodipine 10 mg daily. CMP pending.

## 2022-06-03 NOTE — Assessment & Plan Note (Signed)
Reviewed MRI from June 2021, cyst determined to be benign.

## 2022-06-06 NOTE — Progress Notes (Unsigned)
    Davanna He T. Denis Carreon, MD, [place] at [facility] [place], [address]  Phone: [phone]  FAX: [phone]  YVONNIE SCHINKE - 72 y.o. female  MRN [id number]  Date of Birth: [birth year]  Date: 06/07/2022  PCP: Pleas Koch, NP  Referral: Pleas Koch, NP  No chief complaint on file.  Subjective:   YALONDA SAMPLE is a 71 y.o. very pleasant female patient with There is no height or weight on file to calculate BMI. who presents with the following:  New consultation from Mrs. 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    Review of Systems is noted in the HPI, as appropriate  Objective:   There were no vitals taken for this visit.  GEN: No acute distress; alert,appropriate. PULM: Breathing comfortably in no respiratory distress PSYCH: Normally interactive.   Laboratory and Imaging Data:  Assessment and Plan:   ***

## 2022-06-07 ENCOUNTER — Ambulatory Visit (INDEPENDENT_AMBULATORY_CARE_PROVIDER_SITE_OTHER): Payer: Medicare Other | Admitting: Family Medicine

## 2022-06-07 ENCOUNTER — Encounter: Payer: Self-pay | Admitting: Family Medicine

## 2022-06-07 VITALS — BP 140/66 | HR 81 | Temp 98.4°F | Ht 62.0 in | Wt 163.5 lb

## 2022-06-07 DIAGNOSIS — M19041 Primary osteoarthritis, right hand: Secondary | ICD-10-CM

## 2022-06-07 DIAGNOSIS — M65311 Trigger thumb, right thumb: Secondary | ICD-10-CM | POA: Diagnosis not present

## 2022-06-07 MED ORDER — TRIAMCINOLONE ACETONIDE 40 MG/ML IJ SUSP
20.0000 mg | Freq: Once | INTRAMUSCULAR | Status: AC
  Administered 2022-06-07: 20 mg via INTRA_ARTICULAR

## 2022-06-07 NOTE — Addendum Note (Signed)
Addended by: Carter Kitten on: 06/07/2022 09:53 AM   Modules accepted: Orders

## 2022-06-09 MED ORDER — DAPAGLIFLOZIN PROPANEDIOL 5 MG PO TABS: 5.0000 mg | ORAL_TABLET | Freq: Every day | ORAL | 0 refills | Status: DC

## 2022-06-10 ENCOUNTER — Telehealth: Payer: Self-pay

## 2022-06-10 DIAGNOSIS — E1165 Type 2 diabetes mellitus with hyperglycemia: Secondary | ICD-10-CM

## 2022-06-10 NOTE — Telephone Encounter (Signed)
Prior auth started for [place] '5MG'$  tablets. Lorelai Jani Key: [id number] - [place] Case ID: [id number] - Rx #: [id number]  Please follow up with this as needed.  This was completed under the [email or web address] account.  Thank you

## 2022-06-11 MED ORDER — DAPAGLIFLOZIN PROPANEDIOL 5 MG PO TABS: 5.0000 mg | ORAL_TABLET | Freq: Every day | ORAL | 0 refills | Status: DC

## 2022-06-11 NOTE — Telephone Encounter (Signed)
Routing to pharmacy to see what medication would be a covered alternative or if any financial assistance is available

## 2022-06-11 NOTE — Addendum Note (Signed)
Addended by: Charlton Haws on: 06/11/2022 04:19 PM   Modules accepted: Orders

## 2022-06-11 NOTE — Telephone Encounter (Signed)
I believe the [place]ade is covered under [employer] benefits and comes through [place] mail order - it will not be covered outside of [place].  Refilled med to [facility] by Mail.

## 2022-07-14 DIAGNOSIS — K219 Gastro-esophageal reflux disease without esophagitis: Secondary | ICD-10-CM | POA: Diagnosis not present

## 2022-07-14 LAB — HM DEXA SCAN: HM Dexa Scan: NORMAL

## 2022-08-18 ENCOUNTER — Other Ambulatory Visit: Payer: Self-pay | Admitting: Family Medicine

## 2022-08-18 DIAGNOSIS — I10 Essential (primary) hypertension: Secondary | ICD-10-CM

## 2022-08-18 DIAGNOSIS — K219 Gastro-esophageal reflux disease without esophagitis: Secondary | ICD-10-CM

## 2022-08-18 DIAGNOSIS — E1165 Type 2 diabetes mellitus with hyperglycemia: Secondary | ICD-10-CM

## 2022-08-19 ENCOUNTER — Other Ambulatory Visit: Payer: Self-pay | Admitting: Primary Care

## 2022-08-19 DIAGNOSIS — E785 Hyperlipidemia, unspecified: Secondary | ICD-10-CM

## 2022-08-19 DIAGNOSIS — E1165 Type 2 diabetes mellitus with hyperglycemia: Secondary | ICD-10-CM

## 2022-08-19 MED ORDER — ROSUVASTATIN CALCIUM 20 MG PO TABS: 20.0000 mg | ORAL_TABLET | Freq: Every day | ORAL | 2 refills | Status: DC

## 2022-08-19 MED ORDER — FAMOTIDINE 20 MG PO TABS: 20.0000 mg | ORAL_TABLET | Freq: Two times a day (BID) | ORAL | 2 refills | Status: DC

## 2022-08-19 MED ORDER — SITAGLIPTIN PHOS-METFORMIN HCL 50-1000 MG PO TABS: 1.0000 | ORAL_TABLET | Freq: Two times a day (BID) | ORAL | 0 refills | Status: DC

## 2022-08-19 MED ORDER — DAPAGLIFLOZIN PROPANEDIOL 5 MG PO TABS: 5.0000 mg | ORAL_TABLET | Freq: Every day | ORAL | 0 refills | Status: DC

## 2022-08-19 MED ORDER — AMLODIPINE BESYLATE 10 MG PO TABS: 10.0000 mg | ORAL_TABLET | Freq: Every day | ORAL | 2 refills | Status: DC

## 2022-08-19 MED ORDER — CARVEDILOL 6.25 MG PO TABS: 6.2500 mg | ORAL_TABLET | Freq: Two times a day (BID) | ORAL | 2 refills | Status: DC

## 2022-08-23 ENCOUNTER — Ambulatory Visit (INDEPENDENT_AMBULATORY_CARE_PROVIDER_SITE_OTHER): Payer: Medicare Other | Admitting: Primary Care

## 2022-08-23 ENCOUNTER — Encounter: Payer: Self-pay | Admitting: Primary Care

## 2022-08-23 VITALS — BP 140/78 | HR 98 | Temp 98.6°F | Ht 62.0 in | Wt 159.0 lb

## 2022-08-23 DIAGNOSIS — E1165 Type 2 diabetes mellitus with hyperglycemia: Secondary | ICD-10-CM | POA: Diagnosis not present

## 2022-08-23 LAB — POCT GLYCOSYLATED HEMOGLOBIN (HGB A1C): Hemoglobin A1C: 7.6 % — AB (ref 4.0–5.6)

## 2022-08-23 NOTE — Patient Instructions (Signed)
Work on [employer]. Limit starchy food.  Continue regular exercise!  Please schedule a follow up visit for 6 months for a diabetes check.  It was a pleasure to see you today!

## 2022-08-23 NOTE — Assessment & Plan Note (Signed)
Improved with A1C today of 7.6.  Discussed to work on diet.  Commended her on regular exercise.  Continue sitagliptin-metformin 50-1000 mg BID.  Continue Farxiga 5 mg daily.   Foot exam UTD. Eye exam scheduled.  Follow up in 6 months.

## 2022-08-23 NOTE — Progress Notes (Signed)
Subjective:    Patient ID: Brenda Contreras, female    DOB: 01/20/1951, 72 y.o.   MRN: [id number]  Diabetes Pertinent negatives for hypoglycemia include no dizziness. Pertinent negatives for diabetes include no chest pain.    Brenda Contreras is a very pleasant 71 y.o. female with a history of type 2 diabetes, CKD stage III, hyperlipidemia, hypertension who presents today for follow-up of diabetes.  Current medications include: Farxiga 5 mg daily, Sitagliptin-metformin 50-1000 mg twice daily  She is checking her blood glucose 1 times daily and is getting readings of:  AM: high 100's, low 200's.   Last A1C: 8.1 in June 2023, 7.6 today  Last Eye Exam: Due, scheduled for next month Last Foot Exam: Up-to-date Pneumonia Vaccination: 2018 Urine Microalbumin: None.  Managed on ARB Statin: Rosuvastatin  Dietary changes since last visit: Poor diet, eating "too many carbs" such as pasta, bread, potatoes.    Exercise: Exercise bike at home several times weekly  BP Readings from Last 3 Encounters:  08/23/22 (!) 140/78  06/07/22 140/66  06/03/22 134/78         Review of Systems  Eyes:  Negative for visual disturbance.  Respiratory:  Negative for shortness of breath.   Cardiovascular:  Negative for chest pain.  Neurological:  Negative for dizziness and numbness.         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 colon   Dizziness    GERD (gastroesophageal reflux disease)    High cholesterol    Hypertension    Right hip pain 05/27/2021   Vaginal dryness     Social History   Socioeconomic History   Marital status: Widowed    Spouse name: Not on file   Number of children: Not on file   Years of education: Not on file   Highest education level: Not on file  Occupational History   Not on file  Tobacco Use   Smoking status: Never   Smokeless tobacco: Never  Vaping Use    Vaping Use: Never used  Substance and Sexual Activity   Alcohol use: No   Drug use: No   Sexual activity: Not Currently  Other Topics Concern   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      Has two children and two grandchildren.      Does not have a living will.  Desires CPR, would not want prolonged life support if futile.   Social Determinants of Health   Financial Resource Strain: Low Risk  (05/17/2022)   Overall Financial Resource Strain (CARDIA)    Difficulty of Paying Living Expenses: Not hard at all  Food Insecurity: No Food Insecurity (05/13/2021)   Hunger Vital Sign    Worried About Running Out of Food in the Last Year: Never true    Ran Out of Food in the Last Year: Never true  Transportation Needs: No Transportation Needs (05/17/2022)   PRAPARE - Hydrologist (Medical): No    Lack of Transportation (Non-Medical): No  Physical Activity: Inactive (05/17/2022)   Exercise Vital Sign    Days of Exercise per Week: 0 days    Minutes of Exercise per Session: 0 min  Stress: No Stress Concern Present (05/17/2022)   [place]  Feeling of Stress : Not at all  Social Connections: Moderately Integrated (05/17/2022)   Social Connection and Isolation Panel [NHANES]    Frequency of Communication with Friends and Family: Three times a week    Frequency of Social Gatherings with Friends and Family: Twice a week    Attends Religious Services: More than 4 times per year    Active Member of [employer] or Organizations: Yes    Attends Archivist Meetings: More than 4 times per year    Marital Status: Widowed  Intimate Partner Violence: Not At Risk (05/17/2022)   Humiliation, Afraid, Rape, and Kick questionnaire    Fear of Current or Ex-Partner: No    Emotionally Abused: No    Physically Abused: No    Sexually Abused: No    Past Surgical History:   Procedure Laterality Date   APPENDECTOMY     BREAST BIOPSY     TUBAL LIGATION      Family History  Problem Relation Age of Onset   Diabetes Father    Hypertension Father    Hyperlipidemia Father    Stroke Father    Diabetes Sister    Hyperlipidemia Sister    Hypertension Sister    Lung cancer Paternal Uncle    Diabetes Paternal Grandmother    Heart disease Paternal Grandfather     No Known Allergies  Current Outpatient Medications on File Prior to Visit  Medication Sig Dispense Refill   amLODipine (NORVASC) 10 MG tablet Take 1 tablet (10 mg total) by mouth daily. For blood pressure 90 tablet 2   aspirin EC 81 MG tablet Take 81 mg by mouth daily.     Blood Glucose Monitoring Suppl (ONE TOUCH ULTRA 2) w/Device KIT Use as instructed to test blood sugar daily. Dx is E11.9 1 each 0   carvedilol (COREG) 6.25 MG tablet Take 1 tablet (6.25 mg total) by mouth 2 (two) times daily with a meal. For blood pressure. 180 tablet 2   dapagliflozin propanediol (FARXIGA) 5 MG TABS tablet Take 1 tablet (5 mg total) by mouth daily before breakfast. for diabetes. Office visit required for further refills. 90 tablet 0   famotidine (PEPCID) 20 MG tablet Take 1 tablet (20 mg total) by mouth 2 (two) times daily. For heartburn. 180 tablet 2   glucose blood (ONETOUCH ULTRA) test strip USE TO CHECK BLOOD SUGAR TWICE DAILY AS INSTRUCTED DX E11.9 200 each 1   losartan (COZAAR) 50 MG tablet Take 1 tablet (50 mg total) by mouth daily. For blood pressure. 90 tablet 3   ONE TOUCH LANCETS MISC Use as instructed to test blood sugar daily. Dx is E11.9 200 each 2   rosuvastatin (CRESTOR) 20 MG tablet Take 1 tablet (20 mg total) by mouth at bedtime. For cholesterol. 90 tablet 2   sitaGLIPtin-metformin (JANUMET) 50-1000 MG tablet Take 1 tablet by mouth 2 (two) times daily with a meal. for diabetes. Office visit required for further refills. 180 tablet 0   No current facility-administered medications on file prior to  visit.    BP (!) 140/78   Pulse 98   Temp 98.6 F (37 C) (Oral)   Ht '5\' 2"'  (1.575 m)   Wt 159 lb (72.1 kg)   SpO2 100%   BMI 29.08 kg/m  Objective:   Physical Exam Cardiovascular:     Rate and Rhythm: Normal rate and regular rhythm.  Pulmonary:     Effort: Pulmonary effort is normal.     Breath sounds: Normal  breath sounds.  Musculoskeletal:     Cervical back: Neck supple.  Skin:    General: Skin is warm and dry.           Assessment & Plan:   Problem List Items Addressed This Visit       Endocrine   Type 2 diabetes mellitus with hyperglycemia ([place]) - Primary    Improved with A1C today of 7.6.  Discussed to work on diet.  Commended her on regular exercise.  Continue sitagliptin-metformin 50-1000 mg BID.  Continue Farxiga 5 mg daily.   Foot exam UTD. Eye exam scheduled.  Follow up in 6 months.       Relevant Orders   POCT glycosylated hemoglobin (Hb A1C) (Completed)       Pleas Koch, NP

## 2022-09-15 DIAGNOSIS — I10 Essential (primary) hypertension: Secondary | ICD-10-CM

## 2022-09-15 MED ORDER — LOSARTAN POTASSIUM 50 MG PO TABS: 50.0000 mg | ORAL_TABLET | Freq: Every day | ORAL | 2 refills | Status: DC

## 2022-09-20 DIAGNOSIS — H2513 Age-related nuclear cataract, bilateral: Secondary | ICD-10-CM | POA: Diagnosis not present

## 2022-09-20 LAB — HM DIABETES EYE EXAM

## 2022-09-21 ENCOUNTER — Encounter: Payer: Self-pay | Admitting: Primary Care

## 2022-10-12 ENCOUNTER — Telehealth: Payer: Self-pay | Admitting: Primary Care

## 2022-10-12 NOTE — Telephone Encounter (Signed)
Spoke with patient, verified that this will be addressed at her 6 mo DM follow in February.

## 2022-10-12 NOTE — Telephone Encounter (Signed)
Patient stated she is overdue for a kidney health urinalysis for diabetes. Call back number [phone].

## 2022-10-13 ENCOUNTER — Ambulatory Visit (INDEPENDENT_AMBULATORY_CARE_PROVIDER_SITE_OTHER): Payer: Medicare Other

## 2022-10-13 DIAGNOSIS — Z23 Encounter for immunization: Secondary | ICD-10-CM

## 2022-10-29 ENCOUNTER — Encounter (HOSPITAL_COMMUNITY): Payer: Self-pay | Admitting: Emergency Medicine

## 2022-10-29 ENCOUNTER — Emergency Department (HOSPITAL_COMMUNITY)
Admission: EM | Admit: 2022-10-29 | Discharge: 2022-10-30 | Disposition: A | Discharge status: Home / Self Care | Payer: Medicare Other | Attending: Emergency Medicine | Admitting: Emergency Medicine

## 2022-10-29 ENCOUNTER — Other Ambulatory Visit: Payer: Self-pay

## 2022-10-29 ENCOUNTER — Emergency Department (HOSPITAL_COMMUNITY): Payer: Medicare Other

## 2022-10-29 DIAGNOSIS — Z79899 Other long term (current) drug therapy: Secondary | ICD-10-CM | POA: Insufficient documentation

## 2022-10-29 DIAGNOSIS — E119 Type 2 diabetes mellitus without complications: Secondary | ICD-10-CM | POA: Diagnosis not present

## 2022-10-29 DIAGNOSIS — R1011 Right upper quadrant pain: Secondary | ICD-10-CM | POA: Diagnosis not present

## 2022-10-29 DIAGNOSIS — K8020 Calculus of gallbladder without cholecystitis without obstruction: Secondary | ICD-10-CM | POA: Diagnosis not present

## 2022-10-29 DIAGNOSIS — I10 Essential (primary) hypertension: Secondary | ICD-10-CM | POA: Insufficient documentation

## 2022-10-29 DIAGNOSIS — R0789 Other chest pain: Secondary | ICD-10-CM | POA: Diagnosis not present

## 2022-10-29 DIAGNOSIS — R1010 Upper abdominal pain, unspecified: Secondary | ICD-10-CM | POA: Diagnosis present

## 2022-10-29 DIAGNOSIS — K760 Fatty (change of) liver, not elsewhere classified: Secondary | ICD-10-CM | POA: Diagnosis not present

## 2022-10-29 DIAGNOSIS — R079 Chest pain, unspecified: Secondary | ICD-10-CM | POA: Diagnosis not present

## 2022-10-29 DIAGNOSIS — R140 Abdominal distension (gaseous): Secondary | ICD-10-CM | POA: Diagnosis not present

## 2022-10-29 LAB — HEPATIC FUNCTION PANEL
ALT: 18 U/L (ref 0–44)
AST: 18 U/L (ref 15–41)
Albumin: 4 g/dL (ref 3.5–5.0)
Alkaline Phosphatase: 83 U/L (ref 38–126)
Bilirubin, Direct: 0.2 mg/dL (ref 0.0–0.2)
Indirect Bilirubin: 1.4 mg/dL — ABNORMAL HIGH (ref 0.3–0.9)
Total Bilirubin: 1.6 mg/dL — ABNORMAL HIGH (ref 0.3–1.2)
Total Protein: 7.1 g/dL (ref 6.5–8.1)

## 2022-10-29 LAB — CBC
HCT: 38.1 % (ref 36.0–46.0)
Hemoglobin: 12.3 g/dL (ref 12.0–15.0)
MCH: 29.1 pg (ref 26.0–34.0)
MCHC: 32.3 g/dL (ref 30.0–36.0)
MCV: 90.1 fL (ref 80.0–100.0)
Platelets: 218 10*3/uL (ref 150–400)
RBC: 4.23 MIL/uL (ref 3.87–5.11)
RDW: 13.8 % (ref 11.5–15.5)
WBC: 10.4 10*3/uL (ref 4.0–10.5)
nRBC: 0 % (ref 0.0–0.2)

## 2022-10-29 LAB — BASIC METABOLIC PANEL
Anion gap: 10 (ref 5–15)
BUN: 18 mg/dL (ref 8–23)
CO2: 23 mmol/L (ref 22–32)
Calcium: 9.9 mg/dL (ref 8.9–10.3)
Chloride: 102 mmol/L (ref 98–111)
Creatinine, Ser: 1.25 mg/dL — ABNORMAL HIGH (ref 0.44–1.00)
GFR, Estimated: 46 mL/min — ABNORMAL LOW (ref >60)
Glucose, Bld: 208 mg/dL — ABNORMAL HIGH (ref 70–99)
Potassium: 4.3 mmol/L (ref 3.5–5.1)
Sodium: 135 mmol/L (ref 135–145)

## 2022-10-29 LAB — TROPONIN I (HIGH SENSITIVITY)
Troponin I (High Sensitivity): 5 ng/L (ref <18)
Troponin I (High Sensitivity): 4 ng/L (ref <18)

## 2022-10-29 LAB — LIPASE, BLOOD: Lipase: 45 U/L (ref 11–51)

## 2022-10-29 MED ORDER — ASPIRIN 81 MG PO CHEW
324.0000 mg | CHEWABLE_TABLET | Freq: Once | ORAL | Status: AC
  Administered 2022-10-29: 324 mg via ORAL
  Filled 2022-10-29: qty 4

## 2022-10-29 MED ORDER — SODIUM CHLORIDE 0.9 % IV BOLUS
500.0000 mL | Freq: Once | INTRAVENOUS | Status: AC
  Administered 2022-10-29: 500 mL via INTRAVENOUS

## 2022-10-29 MED ORDER — HYDROCODONE-ACETAMINOPHEN 5-325 MG PO TABS: 1.0000 | ORAL_TABLET | ORAL | 0 refills | Status: DC | PRN

## 2022-10-29 MED ORDER — ALUM & MAG HYDROXIDE-SIMETH 200-200-20 MG/5ML PO SUSP
30.0000 mL | Freq: Once | ORAL | Status: AC
  Administered 2022-10-29: 30 mL via ORAL
  Filled 2022-10-29: qty 30

## 2022-10-29 MED ORDER — HYDROCODONE-ACETAMINOPHEN 5-325 MG PO TABS
1.0000 | ORAL_TABLET | Freq: Once | ORAL | Status: AC
  Administered 2022-10-29: 1 via ORAL
  Filled 2022-10-29: qty 1

## 2022-10-29 NOTE — Discharge Instructions (Addendum)
If you develop worsening, continued, or recurrent abdominal pain, uncontrolled vomiting, fever, chest or back pain, or any other new/concerning symptoms then return to the ER for evaluation.  

## 2022-10-29 NOTE — ED Triage Notes (Signed)
Pt in from home with sharp cp radiating to back, onset this afternoon. Hx HTN and hyperlipidemia. Endorses sob and nausea. Denies any worsened cough or other related symptoms. Reporting pain worse when lying down

## 2022-10-29 NOTE — ED Provider Notes (Signed)
[facility] DEPT Provider Note   CSN: [id number] Arrival date & time: 10/29/22  1958     History {Add pertinent medical, surgical, social history, OB history to HPI:1} Chief Complaint  Patient presents with   Chest Pain    Brenda Contreras is a 71 y.o. female.  HPI 71 year old female with a history of hypertension, diabetes, hyperlipidemia presents with abdominal, chest, and back pain.  Symptoms started around 1 PM this afternoon.  She was not doing anything specific when it started.  It has progressively worsened.  It is mostly a dull sensation and primarily is in her back.  It radiates from her lower chest/upper abdomen around to her back but does not go straight through.  Symptoms are sometimes worse with laying flat.  She took some ibuprofen with partial relief.  No fevers or vomiting but has felt nauseated.  No diarrhea.  No significant exertional symptoms though she felt a little short of breath when walking in here.  Right now the pain is about an 8 out of 10.  Home Medications Prior to Admission medications   Medication Sig Start Date End Date Taking? Authorizing Provider  acetaminophen (TYLENOL) 500 MG tablet Take 1,000 mg by mouth every 6 (six) hours as needed for moderate pain.   Yes [provider]  amLODipine (NORVASC) 10 MG tablet Take 1 tablet (10 mg total) by mouth daily. For blood pressure 08/19/22  Yes Pleas Koch, NP  carvedilol (COREG) 6.25 MG tablet Take 1 tablet (6.25 mg total) by mouth 2 (two) times daily with a meal. For blood pressure. 08/19/22  Yes Pleas Koch, NP  CINNAMON PO Take 2 capsules by mouth daily.   Yes [provider]  dapagliflozin propanediol (FARXIGA) 5 MG TABS tablet Take 1 tablet (5 mg total) by mouth daily before breakfast. for diabetes. Office visit required for further refills. 08/19/22  Yes Pleas Koch, NP  famotidine (PEPCID) 20 MG tablet Take 1 tablet (20 mg total) by mouth 2  (two) times daily. For heartburn. 08/19/22  Yes Pleas Koch, NP  HAWTHORNE BERRY PO Take 1 tablet by mouth daily.   Yes [provider]  ibuprofen (ADVIL) 200 MG tablet Take 400 mg by mouth every 6 (six) hours as needed for moderate pain.   Yes [provider]  losartan (COZAAR) 50 MG tablet Take 1 tablet (50 mg total) by mouth daily. For blood pressure. 09/15/22  Yes Pleas Koch, NP  rosuvastatin (CRESTOR) 20 MG tablet Take 1 tablet (20 mg total) by mouth at bedtime. For cholesterol. 08/19/22 08/19/23 Yes Pleas Koch, NP  sitaGLIPtin-metformin (JANUMET) 50-1000 MG tablet Take 1 tablet by mouth 2 (two) times daily with a meal. for diabetes. Office visit required for further refills. Patient taking differently: Take 1 tablet by mouth 2 (two) times daily with a meal. for diabetes. 08/19/22  Yes Pleas Koch, NP  Blood Glucose Monitoring Suppl (ONE TOUCH ULTRA 2) w/Device KIT Use as instructed to test blood sugar daily. Dx is E11.9 01/12/19   Pleas Koch, NP  glucose blood (ONETOUCH ULTRA) test strip USE TO CHECK BLOOD SUGAR TWICE DAILY AS INSTRUCTED DX E11.9 12/08/21   Pleas Koch, NP  ONE [facility] LANCETS MISC Use as instructed to test blood sugar daily. Dx is E11.9 01/12/19   Pleas Koch, NP      Allergies    Patient has no known allergies.    Review of Systems  Review of Systems  Constitutional:  Negative for fever.  Respiratory:  Positive for shortness of breath.   Cardiovascular:  Positive for chest pain.  Gastrointestinal:  Positive for abdominal pain and nausea. Negative for vomiting.  Musculoskeletal:  Positive for back pain.    Physical Exam Updated Vital Signs BP (!) 171/81   Pulse 77   Temp 98.3 F (36.8 C) (Oral)   Resp (!) 26   Wt 73.5 kg   SpO2 97%   BMI 29.63 kg/m  Physical Exam Vitals and nursing note reviewed.  Constitutional:      General: She is not in acute distress.    Appearance: She is well-developed.  She is not ill-appearing or diaphoretic.  HENT:     Head: Normocephalic and atraumatic.  Cardiovascular:     Rate and Rhythm: Normal rate and regular rhythm.     Heart sounds: Normal heart sounds.  Pulmonary:     Effort: Pulmonary effort is normal.     Breath sounds: Normal breath sounds.  Abdominal:     Palpations: Abdomen is soft.     Tenderness: There is abdominal tenderness in the right upper quadrant.  Skin:    General: Skin is warm and dry.  Neurological:     Mental Status: She is alert.     ED Results / Procedures / Treatments   Labs (all labs ordered are listed, but only abnormal results are displayed) Labs Reviewed  BASIC METABOLIC PANEL - Abnormal; Notable for the following components:      Result Value   Glucose, Bld 208 (*)    Creatinine, Ser 1.25 (*)    GFR, Estimated 46 (*)    All other components within normal limits  CBC  TROPONIN I (HIGH SENSITIVITY)    EKG EKG Interpretation  Date/Time:  Friday October 29 2022 20:16:23 EDT Ventricular Rate:  92 PR Interval:  139 QRS Duration: 94 QT Interval:  366 QTC Calculation: 453 R Axis:   21 Text Interpretation: Sinus rhythm Low voltage, precordial leads no acute ST/T changes Confirmed by Sherwood Gambler [phone]) on 10/29/2022 8:52:59 PM  Radiology DG Chest 2 View  Result Date: 10/29/2022 CLINICAL DATA:  Chest pain EXAM: CHEST - 2 VIEW COMPARISON:  05/26/2018 FINDINGS: Lungs are clear.  No pleural effusion or pneumothorax. The heart is normal in size. Mild degenerative changes of the visualized thoracolumbar spine. IMPRESSION: Normal chest radiographs. Electronically Signed   By: Julian Hy M.D.   On: 10/29/2022 20:38    Procedures Procedures  {Document cardiac monitor, telemetry assessment procedure when appropriate:1}  Medications Ordered in ED Medications  alum & mag hydroxide-simeth (MAALOX/MYLANTA) 200-200-20 MG/5ML suspension 30 mL (has no administration in time range)  aspirin chewable  tablet 324 mg (has no administration in time range)  sodium chloride 0.9 % bolus 500 mL (has no administration in time range)    ED Course/ Medical Decision Making/ A&P                           Medical Decision Making Amount and/or Complexity of Data Reviewed Labs: ordered. Radiology: ordered.  Risk OTC drugs.   ***  {Document critical care time when appropriate:1} {Document review of labs and clinical decision tools ie heart score, Chads2Vasc2 etc:1}  {Document your independent review of radiology images, and any outside records:1} {Document your discussion with family members, caretakers, and with consultants:1} {Document social determinants of health affecting pt's care:1} {Document your decision making why or  why not admission, treatments were needed:1} Final Clinical Impression(s) / ED Diagnoses Final diagnoses:  None    Rx / DC Orders ED Discharge Orders     None

## 2022-10-30 NOTE — ED Notes (Signed)
Pt ambulated from ed with steady gait. Pt and family verbalized understanding of discharge instructions. Pt family to drive home. IV acess removed

## 2022-11-02 ENCOUNTER — Telehealth: Payer: Self-pay

## 2022-11-02 NOTE — Chronic Care Management (AMB) (Addendum)
Chronic Care Management Pharmacy Assistant   Name: Brenda Contreras  MRN: [id number] DOB: 24-May-1951  Reason for Encounter: Hospital Follow Up Non CCM    Medications: Outpatient Encounter Medications as of 11/02/2022  Medication Sig   acetaminophen (TYLENOL) 500 MG tablet Take 1,000 mg by mouth every 6 (six) hours as needed for moderate pain.   amLODipine (NORVASC) 10 MG tablet Take 1 tablet (10 mg total) by mouth daily. For blood pressure   Blood Glucose Monitoring Suppl (ONE TOUCH ULTRA 2) w/Device KIT Use as instructed to test blood sugar daily. Dx is E11.9   carvedilol (COREG) 6.25 MG tablet Take 1 tablet (6.25 mg total) by mouth 2 (two) times daily with a meal. For blood pressure.   CINNAMON PO Take 2 capsules by mouth daily.   dapagliflozin propanediol (FARXIGA) 5 MG TABS tablet Take 1 tablet (5 mg total) by mouth daily before breakfast. for diabetes. Office visit required for further refills.   famotidine (PEPCID) 20 MG tablet Take 1 tablet (20 mg total) by mouth 2 (two) times daily. For heartburn.   glucose blood (ONETOUCH ULTRA) test strip USE TO CHECK BLOOD SUGAR TWICE DAILY AS INSTRUCTED DX E11.9   HAWTHORNE BERRY PO Take 1 tablet by mouth daily.   HYDROcodone-acetaminophen (NORCO) 5-325 MG tablet Take 1 tablet by mouth every 4 (four) hours as needed.   ibuprofen (ADVIL) 200 MG tablet Take 400 mg by mouth every 6 (six) hours as needed for moderate pain.   losartan (COZAAR) 50 MG tablet Take 1 tablet (50 mg total) by mouth daily. For blood pressure.   ONE TOUCH LANCETS MISC Use as instructed to test blood sugar daily. Dx is E11.9   rosuvastatin (CRESTOR) 20 MG tablet Take 1 tablet (20 mg total) by mouth at bedtime. For cholesterol.   sitaGLIPtin-metformin (JANUMET) 50-1000 MG tablet Take 1 tablet by mouth 2 (two) times daily with a meal. for diabetes. Office visit required for further refills. (Patient taking differently: Take 1 tablet by mouth 2 (two) times daily with a meal. for  diabetes.)   No facility-administered encounter medications on file as of 11/02/2022.     Reviewed hospital notes for details of recent visit. Has patient been contacted by Transitions of Care team? No Has patient seen PCP/specialist for hospital follow up (summarize OV if yes): No  Admitted to the ED on 10/29/22. Discharge date was 10/30/22.  Discharged from [facility] ED. Discharge diagnosis (Principal Problem): calculus of gallbladder Patient was discharged to Home  Brief summary of hospital course:  I suspect her "chest pain" is more coming from her gallbladder  She does have some mild right upper quadrant tenderness.  Did not have much relief with a GI cocktail but then was given hydrocodone and now her pain is a lot better.  There is some sludge with some questionable wall thickening but overall presentation is not consistent with cholecystitis.  Given she is feeling better and is reasonable to discharge.  She does have diabetes and other cardiac risk factors this seems unlikely to be cardiac in etiology.  However she should still follow-up with her PCP.  Will refer to general surgery and give a short course of hydrocodone.     New?Medications Started at [facility] Discharge:?? -Started hydrocodone 5/325    Medications that remain the same after Hospital Discharge:??  -All other medications will remain the same.    Next CCM appt: none  Other upcoming appts: No appointments scheduled within the  next 30 days.  Charlene Brooke, PharmD notified and will determine if action is needed.  Avel Sensor, [place]  [phone]  Pharmacist addendum: Pt has had appt with surgery and scheduled cholecystectomy. Went to ED again 11/11 for similar symptoms and vomiting, decision was to continue plan for cholecystectomy which occurred 11/16/22. No further action needed.  Charlene Brooke, PharmD, BCACP 11/23/22 1:02 PM

## 2022-11-04 DIAGNOSIS — I10 Essential (primary) hypertension: Secondary | ICD-10-CM | POA: Diagnosis not present

## 2022-11-04 DIAGNOSIS — K219 Gastro-esophageal reflux disease without esophagitis: Secondary | ICD-10-CM | POA: Diagnosis not present

## 2022-11-04 DIAGNOSIS — E119 Type 2 diabetes mellitus without complications: Secondary | ICD-10-CM | POA: Diagnosis not present

## 2022-11-04 DIAGNOSIS — K760 Fatty (change of) liver, not elsewhere classified: Secondary | ICD-10-CM | POA: Diagnosis not present

## 2022-11-04 DIAGNOSIS — K8020 Calculus of gallbladder without cholecystitis without obstruction: Secondary | ICD-10-CM | POA: Diagnosis not present

## 2022-11-05 NOTE — Progress Notes (Signed)
Surgery orders requested via Epic inbox. °

## 2022-11-06 ENCOUNTER — Encounter (HOSPITAL_COMMUNITY): Payer: Self-pay

## 2022-11-06 ENCOUNTER — Emergency Department (HOSPITAL_COMMUNITY)
Admission: EM | Admit: 2022-11-06 | Discharge: 2022-11-07 | Disposition: A | Discharge status: Home / Self Care | Payer: Medicare Other | Attending: Emergency Medicine | Admitting: Emergency Medicine

## 2022-11-06 ENCOUNTER — Emergency Department (HOSPITAL_COMMUNITY): Payer: Medicare Other

## 2022-11-06 ENCOUNTER — Other Ambulatory Visit: Payer: Self-pay

## 2022-11-06 DIAGNOSIS — E119 Type 2 diabetes mellitus without complications: Secondary | ICD-10-CM | POA: Insufficient documentation

## 2022-11-06 DIAGNOSIS — R109 Unspecified abdominal pain: Secondary | ICD-10-CM | POA: Diagnosis present

## 2022-11-06 DIAGNOSIS — Z79899 Other long term (current) drug therapy: Secondary | ICD-10-CM | POA: Diagnosis not present

## 2022-11-06 DIAGNOSIS — K8020 Calculus of gallbladder without cholecystitis without obstruction: Secondary | ICD-10-CM | POA: Diagnosis not present

## 2022-11-06 DIAGNOSIS — Z7984 Long term (current) use of oral hypoglycemic drugs: Secondary | ICD-10-CM | POA: Insufficient documentation

## 2022-11-06 DIAGNOSIS — R112 Nausea with vomiting, unspecified: Secondary | ICD-10-CM | POA: Diagnosis not present

## 2022-11-06 DIAGNOSIS — K8050 Calculus of bile duct without cholangitis or cholecystitis without obstruction: Secondary | ICD-10-CM | POA: Diagnosis not present

## 2022-11-06 DIAGNOSIS — K760 Fatty (change of) liver, not elsewhere classified: Secondary | ICD-10-CM | POA: Diagnosis not present

## 2022-11-06 DIAGNOSIS — R079 Chest pain, unspecified: Secondary | ICD-10-CM | POA: Diagnosis not present

## 2022-11-06 DIAGNOSIS — R1011 Right upper quadrant pain: Secondary | ICD-10-CM | POA: Diagnosis not present

## 2022-11-06 DIAGNOSIS — I10 Essential (primary) hypertension: Secondary | ICD-10-CM | POA: Insufficient documentation

## 2022-11-06 LAB — URINALYSIS, ROUTINE W REFLEX MICROSCOPIC
Bacteria, UA: NONE SEEN
Bilirubin Urine: NEGATIVE
Glucose, UA: >=500 mg/dL — AB
Hgb urine dipstick: NEGATIVE
Ketones, ur: NEGATIVE mg/dL
Leukocytes,Ua: NEGATIVE
Nitrite: NEGATIVE
Protein, ur: 30 mg/dL — AB
Specific Gravity, Urine: 1.033 — ABNORMAL HIGH (ref 1.005–1.030)
pH: 5 (ref 5.0–8.0)

## 2022-11-06 LAB — CBC WITH DIFFERENTIAL/PLATELET
Abs Immature Granulocytes: 0.1 10*3/uL — ABNORMAL HIGH (ref 0.00–0.07)
Basophils Absolute: 0.1 10*3/uL (ref 0.0–0.1)
Basophils Relative: 1 %
Eosinophils Absolute: 0 10*3/uL (ref 0.0–0.5)
Eosinophils Relative: 0 %
HCT: 37.1 % (ref 36.0–46.0)
Hemoglobin: 11.6 g/dL — ABNORMAL LOW (ref 12.0–15.0)
Immature Granulocytes: 1 %
Lymphocytes Relative: 8 %
Lymphs Abs: 1.2 10*3/uL (ref 0.7–4.0)
MCH: 28.9 pg (ref 26.0–34.0)
MCHC: 31.3 g/dL (ref 30.0–36.0)
MCV: 92.3 fL (ref 80.0–100.0)
Monocytes Absolute: 1 10*3/uL (ref 0.1–1.0)
Monocytes Relative: 7 %
Neutro Abs: 11.7 10*3/uL — ABNORMAL HIGH (ref 1.7–7.7)
Neutrophils Relative %: 83 %
Platelets: 274 10*3/uL (ref 150–400)
RBC: 4.02 MIL/uL (ref 3.87–5.11)
RDW: 13.8 % (ref 11.5–15.5)
WBC: 14.1 10*3/uL — ABNORMAL HIGH (ref 4.0–10.5)
nRBC: 0 % (ref 0.0–0.2)

## 2022-11-06 LAB — COMPREHENSIVE METABOLIC PANEL
ALT: 17 U/L (ref 0–44)
AST: 18 U/L (ref 15–41)
Albumin: 4.1 g/dL (ref 3.5–5.0)
Alkaline Phosphatase: 74 U/L (ref 38–126)
Anion gap: 10 (ref 5–15)
BUN: 15 mg/dL (ref 8–23)
CO2: 22 mmol/L (ref 22–32)
Calcium: 9.4 mg/dL (ref 8.9–10.3)
Chloride: 104 mmol/L (ref 98–111)
Creatinine, Ser: 1.34 mg/dL — ABNORMAL HIGH (ref 0.44–1.00)
GFR, Estimated: 42 mL/min — ABNORMAL LOW (ref >60)
Glucose, Bld: 226 mg/dL — ABNORMAL HIGH (ref 70–99)
Potassium: 4.4 mmol/L (ref 3.5–5.1)
Sodium: 136 mmol/L (ref 135–145)
Total Bilirubin: 1.2 mg/dL (ref 0.3–1.2)
Total Protein: 8.3 g/dL — ABNORMAL HIGH (ref 6.5–8.1)

## 2022-11-06 LAB — TROPONIN I (HIGH SENSITIVITY): Troponin I (High Sensitivity): 4 ng/L (ref <18)

## 2022-11-06 LAB — LIPASE, BLOOD: Lipase: 33 U/L (ref 11–51)

## 2022-11-06 MED ORDER — HYDROCODONE-ACETAMINOPHEN 5-325 MG PO TABS
1.0000 | ORAL_TABLET | Freq: Once | ORAL | Status: AC
  Administered 2022-11-06: 1 via ORAL
  Filled 2022-11-06: qty 1

## 2022-11-06 MED ORDER — DICYCLOMINE HCL 10 MG PO CAPS
10.0000 mg | ORAL_CAPSULE | Freq: Once | ORAL | Status: AC
  Administered 2022-11-06: 10 mg via ORAL
  Filled 2022-11-06: qty 1

## 2022-11-06 MED ORDER — METOCLOPRAMIDE HCL 10 MG PO TABS
10.0000 mg | ORAL_TABLET | Freq: Once | ORAL | Status: AC
  Administered 2022-11-06: 10 mg via ORAL
  Filled 2022-11-06: qty 1

## 2022-11-06 NOTE — ED Provider Triage Note (Signed)
Emergency Medicine Provider Triage Evaluation Note  Brenda Contreras , a 71 y.o. female  was evaluated in triage.  Pt complains of RUQ pain and vomitus starting today 1PM w/chest pain. Hx of gallstones, scheduled surgery on 11/21.  Review of Systems  Positive: Chest pain, RUQ pain ,SOB Negative: Fever, chills  Physical Exam  BP (!) 121/58 (BP Location: Right Arm)   Pulse 73   Temp 98.3 F (36.8 C) (Oral)   Resp 18   SpO2 100%  Gen:   Awake, no distress  Resp:  Normal effort  MSK:   Moves extremities without difficulty  Other:  RUQ pain w/o guarding.  Medical Decision Making  Medically screening exam initiated at 4:05 PM.  Appropriate orders placed.  Brenda Contreras was informed that the remainder of the evaluation will be completed by another provider, this initial triage assessment does not replace that evaluation, and the importance of remaining in the ED until their evaluation is complete.     Osvaldo Shipper, [place] 11/06/22 1606

## 2022-11-06 NOTE — ED Triage Notes (Signed)
Patient said she is having a gall bladder attack. Stated that her surgery is scheduled for 11/21 but she had a lot of pain and began to vomit today.

## 2022-11-06 NOTE — ED Provider Notes (Signed)
[facility] DEPT Provider Note   CSN: [id number] Arrival date & time: 11/06/22  1541     History {Add pertinent medical, surgical, social history, OB history to HPI:1} Chief Complaint  Patient presents with   Emesis    Brenda Contreras is a 71 y.o. female.  71 year old female presents to the emergency department for evaluation of abdominal pain and vomiting.  She states that pain and nausea began at 1300 with vomiting.  She continued vomiting until around 1500.  She did not take any medications for her symptoms as she reports she did not have anything at home to use.  She continues to have pain in her right upper quadrant which she subjectively feels is worse compared to when she was initially seen for these complaints 1 week ago.  Has been diagnosed with gallstones with plan for elective cholecystectomy in 10 days.  She has not had any associated fevers, hematemesis, bowel changes.  The history is provided by the patient. No language interpreter was used.  Emesis      Home Medications Prior to Admission medications   Medication Sig Start Date End Date Taking? Authorizing Provider  acetaminophen (TYLENOL) 500 MG tablet Take 1,000 mg by mouth every 6 (six) hours as needed for moderate pain.    [provider]  amLODipine (NORVASC) 10 MG tablet Take 1 tablet (10 mg total) by mouth daily. For blood pressure 08/19/22   Pleas Koch, NP  Blood Glucose Monitoring Suppl (ONE TOUCH ULTRA 2) w/Device KIT Use as instructed to test blood sugar daily. Dx is E11.9 01/12/19   Pleas Koch, NP  carvedilol (COREG) 6.25 MG tablet Take 1 tablet (6.25 mg total) by mouth 2 (two) times daily with a meal. For blood pressure. 08/19/22   Pleas Koch, NP  CINNAMON PO Take 2 capsules by mouth daily.    [provider]  dapagliflozin propanediol (FARXIGA) 5 MG TABS tablet Take 1 tablet (5 mg total) by mouth daily before breakfast. for diabetes.  Office visit required for further refills. 08/19/22   Pleas Koch, NP  famotidine (PEPCID) 20 MG tablet Take 1 tablet (20 mg total) by mouth 2 (two) times daily. For heartburn. 08/19/22   Pleas Koch, NP  glucose blood (ONETOUCH ULTRA) test strip USE TO CHECK BLOOD SUGAR TWICE DAILY AS INSTRUCTED DX E11.9 12/08/21   Pleas Koch, NP  HAWTHORNE BERRY PO Take 1 tablet by mouth daily.    [provider]  HYDROcodone-acetaminophen (NORCO) 5-325 MG tablet Take 1 tablet by mouth every 4 (four) hours as needed. 10/29/22   Sherwood Gambler, MD  ibuprofen (ADVIL) 200 MG tablet Take 400 mg by mouth every 6 (six) hours as needed for moderate pain.    [provider]  losartan (COZAAR) 50 MG tablet Take 1 tablet (50 mg total) by mouth daily. For blood pressure. 09/15/22   Pleas Koch, NP  ONE TOUCH LANCETS MISC Use as instructed to test blood sugar daily. Dx is E11.9 01/12/19   Pleas Koch, NP  rosuvastatin (CRESTOR) 20 MG tablet Take 1 tablet (20 mg total) by mouth at bedtime. For cholesterol. 08/19/22 08/19/23  Pleas Koch, NP  sitaGLIPtin-metformin (JANUMET) 50-1000 MG tablet Take 1 tablet by mouth 2 (two) times daily with a meal. for diabetes. Office visit required for further refills. Patient taking differently: Take 1 tablet by mouth 2 (two) times daily with a meal. for diabetes. 08/19/22   Alma Friendly  K, NP      Allergies    Patient has no known allergies.    Review of Systems   Review of Systems  Gastrointestinal:  Positive for vomiting.  Ten systems reviewed and are negative for acute change, except as noted in the HPI.    Physical Exam Updated Vital Signs BP 139/69 (BP Location: Right Arm)   Pulse 77   Temp 98.4 F (36.9 C) (Oral)   Resp 16   SpO2 100%   Physical Exam Vitals and nursing note reviewed.  Constitutional:      General: She is not in acute distress.    Appearance: She is well-developed. She is not diaphoretic.      Comments: Nontoxic appearing and in NAD  HENT:     Head: Normocephalic and atraumatic.  Eyes:     General: No scleral icterus.    Conjunctiva/sclera: Conjunctivae normal.  Cardiovascular:     Rate and Rhythm: Normal rate and regular rhythm.     Pulses: Normal pulses.  Pulmonary:     Effort: Pulmonary effort is normal. No respiratory distress.     Comments: Respirations even and unlabored Abdominal:     Palpations: Abdomen is soft.     Tenderness: There is abdominal tenderness. There is guarding.     Comments: Focal RUQ with guarding. Abdomen mildly distended, soft. No peritoneal signs.  Musculoskeletal:        General: Normal range of motion.     Cervical back: Normal range of motion.  Skin:    General: Skin is warm and dry.     Coloration: Skin is not pale.     Findings: No erythema or rash.  Neurological:     Mental Status: She is alert and oriented to person, place, and time.  Psychiatric:        Behavior: Behavior normal.     ED Results / Procedures / Treatments   Labs (all labs ordered are listed, but only abnormal results are displayed) Labs Reviewed  COMPREHENSIVE METABOLIC PANEL - Abnormal; Notable for the following components:      Result Value   Glucose, Bld 226 (*)    Creatinine, Ser 1.34 (*)    Total Protein 8.3 (*)    GFR, Estimated 42 (*)    All other components within normal limits  CBC WITH DIFFERENTIAL/PLATELET - Abnormal; Notable for the following components:   WBC 14.1 (*)    Hemoglobin 11.6 (*)    Neutro Abs 11.7 (*)    Abs Immature Granulocytes 0.10 (*)    All other components within normal limits  URINALYSIS, ROUTINE W REFLEX MICROSCOPIC - Abnormal; Notable for the following components:   Specific Gravity, Urine 1.033 (*)    Glucose, UA >=500 (*)    Protein, ur 30 (*)    All other components within normal limits  LIPASE, BLOOD  TROPONIN I (HIGH SENSITIVITY)  TROPONIN I (HIGH SENSITIVITY)    EKG None  Radiology DG Chest 1  View  Result Date: 11/06/2022 CLINICAL DATA:  Chest pain with nausea and vomiting. EXAM: CHEST  1 VIEW COMPARISON:  10/29/2022 FINDINGS: Patient rotated right. Midline trachea. Normal heart size for level of inspiration. No pleural effusion or pneumothorax. Clear lungs. IMPRESSION: No active disease. Electronically Signed   By: Abigail Miyamoto M.D.   On: 11/06/2022 16:44    Procedures Procedures  {Document cardiac monitor, telemetry assessment procedure when appropriate:1}  Medications Ordered in ED Medications  metoCLOPramide (REGLAN) tablet 10 mg (has no administration  in time range)  dicyclomine (BENTYL) capsule 10 mg (has no administration in time range)  HYDROcodone-acetaminophen (NORCO/VICODIN) 5-325 MG per tablet 1 tablet (has no administration in time range)  HYDROcodone-acetaminophen (NORCO/VICODIN) 5-325 MG per tablet 1 tablet (1 tablet Oral Given 11/06/22 1620)    ED Course/ Medical Decision Making/ A&P                           Medical Decision Making Amount and/or Complexity of Data Reviewed Radiology: ordered.  Risk Prescription drug management.   ***  {Document critical care time when appropriate:1} {Document review of labs and clinical decision tools ie heart score, Chads2Vasc2 etc:1}  {Document your independent review of radiology images, and any outside records:1} {Document your discussion with family members, caretakers, and with consultants:1} {Document social determinants of health affecting pt's care:1} {Document your decision making why or why not admission, treatments were needed:1} Final Clinical Impression(s) / ED Diagnoses Final diagnoses:  None    Rx / DC Orders ED Discharge Orders     None

## 2022-11-07 MED ORDER — ONDANSETRON 4 MG PO TBDP: 4.0000 mg | ORAL_TABLET | Freq: Three times a day (TID) | ORAL | 0 refills | Status: DC | PRN

## 2022-11-07 MED ORDER — HYDROCODONE-ACETAMINOPHEN 5-325 MG PO TABS: 1.0000 | ORAL_TABLET | ORAL | 0 refills | Status: DC | PRN

## 2022-11-07 NOTE — Discharge Instructions (Addendum)
Continue to avoid fatty/fried foods as this may cause aggravation of your pain.  Take Zofran as prescribed for management of nausea/vomiting.  You may continue to use Norco for management of abdominal pain.  Do not drive or drink alcohol after taking this medication as it may make you drowsy and impair your judgment.  Return to the ED for new or concerning symptoms.

## 2022-11-09 ENCOUNTER — Ambulatory Visit: Payer: Self-pay | Admitting: General Surgery

## 2022-11-09 NOTE — Patient Instructions (Addendum)
SURGICAL WAITING ROOM VISITATION Patients having surgery or a procedure may have no more than 2 support people in the waiting area - these visitors may rotate.   Children under the age of 50 must have an adult with them who is not the patient. If the patient needs to stay at the hospital during part of their recovery, the visitor guidelines for inpatient rooms apply. Pre-op nurse will coordinate an appropriate time for 1 support person to accompany patient in pre-op.  This support person may not rotate.    Please refer to the [facility] website for the visitor guidelines for Inpatients (after your surgery is over and you are in a regular room).    Your procedure is scheduled on: 11/16/22   Report to [facility] Main Entrance    Report to admitting at 5:15 AM   Call this number if you have problems the morning of surgery [phone]   Do not eat food :After Midnight.   After Midnight you may have the following liquids until 4:30 AM DAY OF SURGERY  Water Non-Citrus Juices (without pulp, NO RED) Carbonated Beverages Black Coffee (NO MILK/CREAM OR CREAMERS, sugar ok)  Clear Tea (NO MILK/CREAM OR CREAMERS, sugar ok) regular and decaf                             Plain Jell-O (NO RED)                                           Fruit ices (not with fruit pulp, NO RED)                                     Popsicles (NO RED)                                                               Sports drinks like Gatorade (NO RED)                 The day of surgery:  Drink ONE (1) Pre-Surgery at 4:30 AM the morning of surgery. Drink in one sitting. Do not sip.  This drink was given to you during your hospital  pre-op appointment visit. Nothing else to drink after completing the  Pre-Surgery G2.          If you have questions, please contact your surgeon's office.   FOLLOW BOWEL PREP AND ANY ADDITIONAL PRE OP INSTRUCTIONS YOU RECEIVED FROM YOUR SURGEON'S OFFICE!!!     Oral Hygiene is  also important to reduce your risk of infection.                                    Remember - BRUSH YOUR TEETH THE MORNING OF SURGERY WITH YOUR REGULAR TOOTHPASTE   Do NOT smoke after Midnight   Take these medicines the morning of surgery with A SIP OF WATER: Tylenol, Amlodipine, Carvedilol, Pepcid, Norco, Zofran   DO NOT TAKE ANY ORAL DIABETIC MEDICATIONS DAY  OF YOUR SURGERY How to Manage Your Diabetes Before and After Surgery  Why is it important to control my blood sugar before and after surgery? Improving blood sugar levels before and after surgery helps healing and can limit problems. A way of improving blood sugar control is eating a healthy diet by:  Eating less sugar and carbohydrates  Increasing activity/exercise  Talking with your doctor about reaching your blood sugar goals High blood sugars (greater than 180 mg/dL) can raise your risk of infections and slow your recovery, so you will need to focus on controlling your diabetes during the weeks before surgery. Make sure that the doctor who takes care of your diabetes knows about your planned surgery including the date and location.  How do I manage my blood sugar before surgery? Check your blood sugar at least 4 times a day, starting 2 days before surgery, to make sure that the level is not too high or low. Check your blood sugar the morning of your surgery when you wake up and every 2 hours until you get to the Short Stay unit. If your blood sugar is less than 70 mg/dL, you will need to treat for low blood sugar: Do not take insulin. Treat a low blood sugar (less than 70 mg/dL) with  cup of clear juice (cranberry or apple), 4 glucose tablets, OR glucose gel. Recheck blood sugar in 15 minutes after treatment (to make sure it is greater than 70 mg/dL). If your blood sugar is not greater than 70 mg/dL on recheck, call [phone] for further instructions. Report your blood sugar to the short stay nurse when you get to Short  Stay.  If you are admitted to the hospital after surgery: Your blood sugar will be checked by the staff and you will probably be given insulin after surgery (instead of oral diabetes medicines) to make sure you have good blood sugar levels. The goal for blood sugar control after surgery is 80-180 mg/dL.   WHAT DO I DO ABOUT MY DIABETES MEDICATION?  Do not take oral diabetes medicines (pills) the morning of surgery.  Do not take [place] for 3 days. Last dose 11/12/22  THE DAY BEFORE SURGERY, take Janumet as prescribed.      THE MORNING OF SURGERY, do not take Janumet.  Reviewed and Endorsed by [place] Patient Education Committee, August 2015  Bring CPAP mask and tubing day of surgery.                              You may not have any metal on your body including hair pins, jewelry, and body piercing             Do not wear make-up, lotions, powders, perfumes, or deodorant  Do not wear nail polish including gel and S&S, artificial/acrylic nails, or any other type of covering on natural nails including finger and toenails. If you have artificial nails, gel coating, etc. that needs to be removed by a nail salon please have this removed prior to surgery or surgery may need to be canceled/ delayed if the surgeon/ anesthesia feels like they are unable to be safely monitored.   Do not shave  48 hours prior to surgery.    Do not bring valuables to the hospital. [place].   Contacts, dentures or bridgework may not be  worn into surgery.  DO NOT [place]. PHARMACY WILL DISPENSE MEDICATIONS LISTED ON YOUR MEDICATION LIST TO YOU DURING YOUR ADMISSION [place]!    Patients discharged on the day of surgery will not be allowed to drive home.  Someone NEEDS to stay with you for the first 24 hours after anesthesia.   Special Instructions: Bring a copy of your healthcare power of attorney and living  will documents the day of surgery if you haven't scanned them before.              Please read over the following fact sheets you were given: IF [place] [phone] Schneiders   If you received a COVID test during your pre-op visit  it is requested that you wear a mask when out in public, stay away from anyone that may not be feeling well and notify your surgeon if you develop symptoms. If you test positive for Covid or have been in contact with anyone that has tested positive in the last 10 days please notify you surgeon.    [place] - Preparing for Surgery Before surgery, you can play an important role.  Because skin is not sterile, your skin needs to be as free of germs as possible.  You can reduce the number of germs on your skin by washing with CHG (chlorahexidine gluconate) soap before surgery.  CHG is an antiseptic cleaner which kills germs and bonds with the skin to continue killing germs even after washing. Please DO NOT use if you have an allergy to CHG or antibacterial soaps.  If your skin becomes reddened/irritated stop using the CHG and inform your nurse when you arrive at Short Stay. Do not shave (including legs and underarms) for at least 48 hours prior to the first CHG shower.  You may shave your face/neck.  Please follow these instructions carefully:  1.  Shower with CHG Soap the night before surgery and the  morning of surgery.  2.  If you choose to wash your hair, wash your hair first as usual with your normal  shampoo.  3.  After you shampoo, rinse your hair and body thoroughly to remove the shampoo.                             4.  Use CHG as you would any other liquid soap.  You can apply chg directly to the skin and wash.  Gently with a scrungie or clean washcloth.  5.  Apply the CHG Soap to your body ONLY FROM THE NECK DOWN.   Do   not use on face/ open                           Wound or open sores. Avoid contact with  eyes, ears mouth and   genitals (private parts).                       Wash face,  Genitals (private parts) with your normal soap.             6.  Wash thoroughly, paying special attention to the area where your    surgery  will be performed.  7.  Thoroughly rinse your body with warm water from the neck down.  8.  DO NOT shower/wash with your normal soap  after using and rinsing off the CHG Soap.                9.  Pat yourself dry with a clean towel.            10.  Wear clean pajamas.            11.  Place clean sheets on your bed the night of your first shower and do not  sleep with pets. Day of Surgery : Do not apply any lotions/deodorants the morning of surgery.  Please wear clean clothes to the hospital/surgery center.  FAILURE TO FOLLOW THESE INSTRUCTIONS MAY RESULT IN THE CANCELLATION OF YOUR SURGERY  PATIENT SIGNATURE_________________________________  NURSE SIGNATURE__________________________________  ________________________________________________________________________

## 2022-11-09 NOTE — Progress Notes (Signed)
COVID Vaccine Completed: yes  Date of COVID positive in last 90 days:  PCP - Alma Friendly, NP Cardiologist -   Chest x-ray - 11/06/22 Epic EKG - 10/29/22 Epic Stress Test -  ECHO -  Cardiac Cath -  Pacemaker/ICD device last checked: Spinal Cord Stimulator:  Bowel Prep -   Sleep Study -  CPAP -   Fasting Blood Sugar -  Checks Blood Sugar _____ times a day  Last dose of GLP1 agonist-  N/A GLP1 instructions:  N/A   Last dose of SGLT-2 inhibitors-  N/A SGLT-2 instructions: N/A   Blood Thinner Instructions: Aspirin Instructions: Last Dose:  Activity level:  Can go up a flight of stairs and perform activities of daily living without stopping and without symptoms of chest pain or shortness of breath.  Able to exercise without symptoms  Unable to go up a flight of stairs without symptoms of     Anesthesia review:   Patient denies shortness of breath, fever, cough and chest pain at PAT appointment  Patient verbalized understanding of instructions that were given to them at the PAT appointment. Patient was also instructed that they will need to review over the PAT instructions again at home before surgery.

## 2022-11-10 ENCOUNTER — Encounter (HOSPITAL_COMMUNITY)
Admission: RE | Admit: 2022-11-10 | Discharge: 2022-11-10 | Disposition: A | Discharge status: Home / Self Care | Payer: Medicare Other | Source: Ambulatory Visit | Attending: General Surgery | Admitting: General Surgery

## 2022-11-10 ENCOUNTER — Encounter (HOSPITAL_COMMUNITY): Payer: Self-pay

## 2022-11-10 VITALS — BP 116/61 | HR 77 | Temp 98.4°F | Resp 12 | Ht 62.0 in | Wt 153.0 lb

## 2022-11-10 DIAGNOSIS — Z01812 Encounter for preprocedural laboratory examination: Secondary | ICD-10-CM | POA: Insufficient documentation

## 2022-11-10 DIAGNOSIS — E1165 Type 2 diabetes mellitus with hyperglycemia: Secondary | ICD-10-CM

## 2022-11-10 LAB — HEMOGLOBIN A1C
Hgb A1c MFr Bld: 7.4 % — ABNORMAL HIGH (ref 4.8–5.6)
Mean Plasma Glucose: 165.68 mg/dL

## 2022-11-10 LAB — GLUCOSE, CAPILLARY: Glucose-Capillary: 225 mg/dL — ABNORMAL HIGH (ref 70–99)

## 2022-11-15 NOTE — Anesthesia Preprocedure Evaluation (Signed)
Anesthesia Evaluation  Patient identified by MRN, date of birth, ID band Patient awake    Reviewed: Allergy & Precautions, NPO status , Patient's Chart, lab work & pertinent test results  Airway Mallampati: II  TM Distance: >3 FB Neck ROM: Full    Dental no notable dental hx. (+) Teeth Intact, Dental Advisory Given   Pulmonary neg pulmonary ROS   Pulmonary exam normal breath sounds clear to auscultation       Cardiovascular hypertension, Pt. on medications Normal cardiovascular exam Rhythm:Regular Rate:Normal     Neuro/Psych negative neurological ROS  negative psych ROS   GI/Hepatic ,GERD  ,,  Endo/Other  diabetes, Type 1    Renal/GU Renal diseaseLab Results      Component                Value               Date                      CREATININE               1.34 (H)            11/06/2022                K                        4.4                 11/06/2022                    Musculoskeletal   Abdominal   Peds  Hematology Lab Results      Component                Value               Date                           HGB                      11.6 (L)            11/06/2022                HCT                      37.1                11/06/2022                PLT                      274                 11/06/2022              Anesthesia Other Findings   Reproductive/Obstetrics                             Anesthesia Physical Anesthesia Plan  ASA: 3  Anesthesia Plan: General   Post-op Pain Management: Toradol IV (intra-op)* and Tylenol PO (pre-op)*   Induction: Intravenous  PONV Risk Score and Plan: 4 or greater and Treatment may vary due to age or medical condition, Ondansetron and Midazolam  Airway Management Planned: Oral ETT  Additional Equipment: None  Intra-op Plan:   Post-operative Plan: Extubation in OR  Informed Consent: I have reviewed the patients History and Physical,  chart, labs and discussed the procedure including the risks, benefits and alternatives for the proposed anesthesia with the patient or authorized representative who has indicated his/her understanding and acceptance.     Dental advisory given  Plan Discussed with:   Anesthesia Plan Comments:        Anesthesia Quick Evaluation

## 2022-11-16 ENCOUNTER — Other Ambulatory Visit: Payer: Self-pay

## 2022-11-16 ENCOUNTER — Ambulatory Visit (HOSPITAL_COMMUNITY): Payer: Medicare Other | Admitting: Certified Registered Nurse Anesthetist

## 2022-11-16 ENCOUNTER — Observation Stay (HOSPITAL_COMMUNITY)
Admission: RE | Admit: 2022-11-16 | Discharge: 2022-11-17 | Disposition: A | Discharge status: Home / Self Care | Payer: Medicare Other | Source: Ambulatory Visit | Attending: General Surgery | Admitting: General Surgery

## 2022-11-16 ENCOUNTER — Ambulatory Visit (HOSPITAL_BASED_OUTPATIENT_CLINIC_OR_DEPARTMENT_OTHER): Payer: Medicare Other | Admitting: Certified Registered Nurse Anesthetist

## 2022-11-16 ENCOUNTER — Encounter (HOSPITAL_COMMUNITY): Admission: RE | Disposition: A | Discharge status: Home / Self Care | Payer: Self-pay | Source: Ambulatory Visit

## 2022-11-16 ENCOUNTER — Encounter (HOSPITAL_COMMUNITY): Payer: Self-pay | Admitting: General Surgery

## 2022-11-16 DIAGNOSIS — N289 Disorder of kidney and ureter, unspecified: Secondary | ICD-10-CM | POA: Diagnosis not present

## 2022-11-16 DIAGNOSIS — N1831 Chronic kidney disease, stage 3a: Secondary | ICD-10-CM | POA: Insufficient documentation

## 2022-11-16 DIAGNOSIS — E1165 Type 2 diabetes mellitus with hyperglycemia: Secondary | ICD-10-CM

## 2022-11-16 DIAGNOSIS — Z7982 Long term (current) use of aspirin: Secondary | ICD-10-CM | POA: Diagnosis not present

## 2022-11-16 DIAGNOSIS — I129 Hypertensive chronic kidney disease with stage 1 through stage 4 chronic kidney disease, or unspecified chronic kidney disease: Secondary | ICD-10-CM | POA: Insufficient documentation

## 2022-11-16 DIAGNOSIS — Z79899 Other long term (current) drug therapy: Secondary | ICD-10-CM | POA: Diagnosis not present

## 2022-11-16 DIAGNOSIS — K8020 Calculus of gallbladder without cholecystitis without obstruction: Secondary | ICD-10-CM | POA: Diagnosis not present

## 2022-11-16 DIAGNOSIS — E109 Type 1 diabetes mellitus without complications: Secondary | ICD-10-CM | POA: Diagnosis not present

## 2022-11-16 DIAGNOSIS — Z7984 Long term (current) use of oral hypoglycemic drugs: Secondary | ICD-10-CM | POA: Diagnosis not present

## 2022-11-16 DIAGNOSIS — E1122 Type 2 diabetes mellitus with diabetic chronic kidney disease: Secondary | ICD-10-CM | POA: Diagnosis not present

## 2022-11-16 DIAGNOSIS — I10 Essential (primary) hypertension: Secondary | ICD-10-CM

## 2022-11-16 DIAGNOSIS — K8000 Calculus of gallbladder with acute cholecystitis without obstruction: Secondary | ICD-10-CM | POA: Diagnosis not present

## 2022-11-16 DIAGNOSIS — Z9049 Acquired absence of other specified parts of digestive tract: Secondary | ICD-10-CM

## 2022-11-16 HISTORY — PX: CHOLECYSTECTOMY: SHX55

## 2022-11-16 HISTORY — DX: Acquired absence of other specified parts of digestive tract: Z90.49

## 2022-11-16 LAB — GLUCOSE, CAPILLARY
Glucose-Capillary: 164 mg/dL — ABNORMAL HIGH (ref 70–99)
Glucose-Capillary: 242 mg/dL — ABNORMAL HIGH (ref 70–99)
Glucose-Capillary: 197 mg/dL — ABNORMAL HIGH (ref 70–99)
Glucose-Capillary: 181 mg/dL — ABNORMAL HIGH (ref 70–99)

## 2022-11-16 SURGERY — LAPAROSCOPIC CHOLECYSTECTOMY WITH INTRAOPERATIVE CHOLANGIOGRAM
Anesthesia: General

## 2022-11-16 MED ORDER — CHLORHEXIDINE GLUCONATE CLOTH 2 % EX PADS: 6.0000 | MEDICATED_PAD | Freq: Once | CUTANEOUS | Status: DC

## 2022-11-16 MED ORDER — PROPOFOL 10 MG/ML IV BOLUS
INTRAVENOUS | Status: DC | PRN
  Administered 2022-11-16: 100 mg via INTRAVENOUS

## 2022-11-16 MED ORDER — KETOROLAC TROMETHAMINE 15 MG/ML IJ SOLN: 15.0000 mg | Freq: Once | INTRAMUSCULAR | Status: DC | PRN

## 2022-11-16 MED ORDER — ENOXAPARIN SODIUM 40 MG/0.4ML IJ SOSY
40.0000 mg | PREFILLED_SYRINGE | INTRAMUSCULAR | Status: DC
  Administered 2022-11-17: 40 mg via SUBCUTANEOUS
  Filled 2022-11-16: qty 0.4

## 2022-11-16 MED ORDER — CHLORHEXIDINE GLUCONATE 0.12 % MT SOLN
15.0000 mL | Freq: Once | OROMUCOSAL | Status: AC
  Administered 2022-11-16: 15 mL via OROMUCOSAL

## 2022-11-16 MED ORDER — DOCUSATE SODIUM 100 MG PO CAPS
100.0000 mg | ORAL_CAPSULE | Freq: Two times a day (BID) | ORAL | Status: DC
  Administered 2022-11-16 – 2022-11-17 (×2): 100 mg via ORAL
  Filled 2022-11-16 (×2): qty 1

## 2022-11-16 MED ORDER — ONDANSETRON HCL 4 MG/2ML IJ SOLN
4.0000 mg | Freq: Four times a day (QID) | INTRAMUSCULAR | Status: DC | PRN
  Filled 2022-11-16: qty 2

## 2022-11-16 MED ORDER — DIPHENHYDRAMINE HCL 12.5 MG/5ML PO ELIX: 12.5000 mg | ORAL_SOLUTION | Freq: Four times a day (QID) | ORAL | Status: DC | PRN

## 2022-11-16 MED ORDER — ONDANSETRON HCL 4 MG/2ML IJ SOLN
INTRAMUSCULAR | Status: AC
  Filled 2022-11-16: qty 2

## 2022-11-16 MED ORDER — MIDAZOLAM HCL 5 MG/5ML IJ SOLN
INTRAMUSCULAR | Status: DC | PRN
  Administered 2022-11-16: 2 mg via INTRAVENOUS

## 2022-11-16 MED ORDER — ACETAMINOPHEN 500 MG PO TABS
1000.0000 mg | ORAL_TABLET | ORAL | Status: AC
  Administered 2022-11-16: 1000 mg via ORAL
  Filled 2022-11-16: qty 2

## 2022-11-16 MED ORDER — INSULIN ASPART 100 UNIT/ML IJ SOLN
0.0000 [IU] | Freq: Three times a day (TID) | INTRAMUSCULAR | Status: DC
  Administered 2022-11-16 – 2022-11-17 (×3): 3 [IU] via SUBCUTANEOUS

## 2022-11-16 MED ORDER — SUGAMMADEX SODIUM 200 MG/2ML IV SOLN
INTRAVENOUS | Status: DC | PRN
  Administered 2022-11-16: 200 mg via INTRAVENOUS

## 2022-11-16 MED ORDER — ORAL CARE MOUTH RINSE: 15.0000 mL | Freq: Once | OROMUCOSAL | Status: AC

## 2022-11-16 MED ORDER — INSULIN ASPART 100 UNIT/ML IJ SOLN
INTRAMUSCULAR | Status: AC
  Filled 2022-11-16: qty 1

## 2022-11-16 MED ORDER — MIDAZOLAM HCL 2 MG/2ML IJ SOLN
INTRAMUSCULAR | Status: AC
  Filled 2022-11-16: qty 2

## 2022-11-16 MED ORDER — OXYCODONE HCL 5 MG PO TABS
5.0000 mg | ORAL_TABLET | ORAL | Status: DC | PRN
  Administered 2022-11-16 – 2022-11-17 (×4): 5 mg via ORAL
  Filled 2022-11-16 (×3): qty 1

## 2022-11-16 MED ORDER — SODIUM CHLORIDE 0.9 % IV SOLN
2.0000 g | INTRAVENOUS | Status: AC
  Administered 2022-11-16: 2 g via INTRAVENOUS
  Filled 2022-11-16: qty 2

## 2022-11-16 MED ORDER — FENTANYL CITRATE PF 50 MCG/ML IJ SOSY
PREFILLED_SYRINGE | INTRAMUSCULAR | Status: AC
  Filled 2022-11-16: qty 2

## 2022-11-16 MED ORDER — FENTANYL CITRATE PF 50 MCG/ML IJ SOSY
25.0000 ug | PREFILLED_SYRINGE | INTRAMUSCULAR | Status: DC | PRN
  Administered 2022-11-16 (×2): 50 ug via INTRAVENOUS

## 2022-11-16 MED ORDER — MORPHINE SULFATE (PF) 2 MG/ML IV SOLN
INTRAVENOUS | Status: AC
  Filled 2022-11-16: qty 1

## 2022-11-16 MED ORDER — FENTANYL CITRATE (PF) 100 MCG/2ML IJ SOLN
INTRAMUSCULAR | Status: DC | PRN
  Administered 2022-11-16 (×2): 100 ug via INTRAVENOUS
  Administered 2022-11-16 (×2): 50 ug via INTRAVENOUS

## 2022-11-16 MED ORDER — PHENYLEPHRINE 80 MCG/ML (10ML) SYRINGE FOR IV PUSH (FOR BLOOD PRESSURE SUPPORT)
PREFILLED_SYRINGE | INTRAVENOUS | Status: DC | PRN
  Administered 2022-11-16: 120 ug via INTRAVENOUS

## 2022-11-16 MED ORDER — MORPHINE SULFATE (PF) 2 MG/ML IV SOLN
1.0000 mg | INTRAVENOUS | Status: DC | PRN
  Administered 2022-11-16: 2 mg via INTRAVENOUS

## 2022-11-16 MED ORDER — BUPIVACAINE-EPINEPHRINE (PF) 0.5% -1:200000 IJ SOLN
INTRAMUSCULAR | Status: AC
  Filled 2022-11-16: qty 30

## 2022-11-16 MED ORDER — LACTATED RINGERS IV SOLN: INTRAVENOUS | Status: DC

## 2022-11-16 MED ORDER — ONDANSETRON HCL 4 MG/2ML IJ SOLN: 4.0000 mg | Freq: Once | INTRAMUSCULAR | Status: DC | PRN

## 2022-11-16 MED ORDER — DEXAMETHASONE SODIUM PHOSPHATE 4 MG/ML IJ SOLN
INTRAMUSCULAR | Status: DC | PRN
  Administered 2022-11-16: 5 mg via INTRAVENOUS

## 2022-11-16 MED ORDER — ONDANSETRON HCL 4 MG/2ML IJ SOLN
INTRAMUSCULAR | Status: DC | PRN
  Administered 2022-11-16: 4 mg via INTRAVENOUS

## 2022-11-16 MED ORDER — AMLODIPINE BESYLATE 10 MG PO TABS
10.0000 mg | ORAL_TABLET | Freq: Every day | ORAL | Status: DC
  Administered 2022-11-17: 10 mg via ORAL
  Filled 2022-11-16: qty 1

## 2022-11-16 MED ORDER — PANTOPRAZOLE SODIUM 40 MG IV SOLR
40.0000 mg | Freq: Every day | INTRAVENOUS | Status: DC
  Administered 2022-11-16: 40 mg via INTRAVENOUS
  Filled 2022-11-16: qty 10

## 2022-11-16 MED ORDER — PROPOFOL 10 MG/ML IV BOLUS
INTRAVENOUS | Status: AC
  Filled 2022-11-16: qty 20

## 2022-11-16 MED ORDER — DEXMEDETOMIDINE HCL IN NACL 80 MCG/20ML IV SOLN
INTRAVENOUS | Status: DC | PRN
  Administered 2022-11-16: 8 ug via BUCCAL

## 2022-11-16 MED ORDER — ACETAMINOPHEN 500 MG PO TABS
1000.0000 mg | ORAL_TABLET | Freq: Four times a day (QID) | ORAL | Status: DC
  Administered 2022-11-16 – 2022-11-17 (×3): 1000 mg via ORAL
  Filled 2022-11-16 (×3): qty 2

## 2022-11-16 MED ORDER — SPY AGENT GREEN - (INDOCYANINE FOR INJECTION)
INTRAMUSCULAR | Status: DC | PRN
  Administered 2022-11-16: 1 mL via INTRAVENOUS

## 2022-11-16 MED ORDER — 0.9 % SODIUM CHLORIDE (POUR BTL) OPTIME
TOPICAL | Status: DC | PRN
  Administered 2022-11-16: 1000 mL

## 2022-11-16 MED ORDER — DEXAMETHASONE SODIUM PHOSPHATE 10 MG/ML IJ SOLN
INTRAMUSCULAR | Status: AC
  Filled 2022-11-16: qty 1

## 2022-11-16 MED ORDER — LOSARTAN POTASSIUM 50 MG PO TABS
50.0000 mg | ORAL_TABLET | Freq: Every day | ORAL | Status: DC
  Administered 2022-11-17: 50 mg via ORAL
  Filled 2022-11-16: qty 1

## 2022-11-16 MED ORDER — ENSURE PRE-SURGERY PO LIQD: 296.0000 mL | Freq: Once | ORAL | Status: DC

## 2022-11-16 MED ORDER — OXYCODONE HCL 5 MG PO TABS
ORAL_TABLET | ORAL | Status: AC
  Filled 2022-11-16: qty 1

## 2022-11-16 MED ORDER — SIMETHICONE 80 MG PO CHEW: 40.0000 mg | CHEWABLE_TABLET | Freq: Four times a day (QID) | ORAL | Status: DC | PRN

## 2022-11-16 MED ORDER — BUPIVACAINE-EPINEPHRINE (PF) 0.5% -1:200000 IJ SOLN
INTRAMUSCULAR | Status: DC | PRN
  Administered 2022-11-16: 20 mL

## 2022-11-16 MED ORDER — PIPERACILLIN-TAZOBACTAM 3.375 G IVPB
3.3750 g | Freq: Three times a day (TID) | INTRAVENOUS | Status: AC
  Administered 2022-11-16 – 2022-11-17 (×3): 3.375 g via INTRAVENOUS
  Filled 2022-11-16 (×3): qty 50

## 2022-11-16 MED ORDER — ROCURONIUM BROMIDE 10 MG/ML (PF) SYRINGE
PREFILLED_SYRINGE | INTRAVENOUS | Status: AC
  Filled 2022-11-16: qty 10

## 2022-11-16 MED ORDER — FENTANYL CITRATE (PF) 100 MCG/2ML IJ SOLN
INTRAMUSCULAR | Status: AC
  Filled 2022-11-16: qty 2

## 2022-11-16 MED ORDER — ONDANSETRON 4 MG PO TBDP: 4.0000 mg | ORAL_TABLET | Freq: Four times a day (QID) | ORAL | Status: DC | PRN

## 2022-11-16 MED ORDER — CARVEDILOL 6.25 MG PO TABS
6.2500 mg | ORAL_TABLET | Freq: Two times a day (BID) | ORAL | Status: DC
  Administered 2022-11-16 – 2022-11-17 (×2): 6.25 mg via ORAL
  Filled 2022-11-16 (×2): qty 1

## 2022-11-16 MED ORDER — MELATONIN 3 MG PO TABS: 3.0000 mg | ORAL_TABLET | Freq: Every evening | ORAL | Status: DC | PRN

## 2022-11-16 MED ORDER — DIPHENHYDRAMINE HCL 50 MG/ML IJ SOLN: 12.5000 mg | Freq: Four times a day (QID) | INTRAMUSCULAR | Status: DC | PRN

## 2022-11-16 MED ORDER — KCL IN DEXTROSE-NACL 20-5-0.45 MEQ/L-%-% IV SOLN
INTRAVENOUS | Status: DC
  Filled 2022-11-16: qty 1000

## 2022-11-16 MED ORDER — LIDOCAINE 2% (20 MG/ML) 5 ML SYRINGE
INTRAMUSCULAR | Status: DC | PRN
  Administered 2022-11-16: 100 mg via INTRAVENOUS

## 2022-11-16 MED ORDER — ROCURONIUM BROMIDE 10 MG/ML (PF) SYRINGE
PREFILLED_SYRINGE | INTRAVENOUS | Status: DC | PRN
  Administered 2022-11-16: 50 mg via INTRAVENOUS
  Administered 2022-11-16: 20 mg via INTRAVENOUS

## 2022-11-16 MED ORDER — LACTATED RINGERS IR SOLN
Status: DC | PRN
  Administered 2022-11-16: 1000 mL

## 2022-11-16 MED ORDER — LIDOCAINE HCL (PF) 2 % IJ SOLN
INTRAMUSCULAR | Status: AC
  Filled 2022-11-16: qty 5

## 2022-11-16 SURGICAL SUPPLY — 51 items
APPLICATOR ARISTA FLEXITIP XL (MISCELLANEOUS) IMPLANT
APPLIER CLIP 5 13 M/L LIGAMAX5 (MISCELLANEOUS) ×1
APPLIER CLIP ROT 10 11.4 M/L (STAPLE)
BAG COUNTER SPONGE SURGICOUNT (BAG) IMPLANT
CABLE HIGH FREQUENCY MONO STRZ (ELECTRODE) ×1 IMPLANT
CHLORAPREP W/TINT 26 (MISCELLANEOUS) ×1 IMPLANT
CLIP APPLIE 5 13 M/L LIGAMAX5 (MISCELLANEOUS) IMPLANT
CLIP APPLIE ROT 10 11.4 M/L (STAPLE) IMPLANT
CLIP LIGATING HEMO O LOK GREEN (MISCELLANEOUS) IMPLANT
COVER MAYO STAND XLG (MISCELLANEOUS) IMPLANT
COVER SURGICAL LIGHT HANDLE (MISCELLANEOUS) ×1 IMPLANT
DERMABOND ADVANCED .7 DNX12 (GAUZE/BANDAGES/DRESSINGS) IMPLANT
DRAIN CHANNEL 19F RND (DRAIN) IMPLANT
DRAPE C-ARM 42X120 X-RAY (DRAPES) IMPLANT
DRSG TEGADERM 2-3/8X2-3/4 SM (GAUZE/BANDAGES/DRESSINGS) ×3 IMPLANT
DRSG TEGADERM 4X4.75 (GAUZE/BANDAGES/DRESSINGS) ×1 IMPLANT
ELECT REM PT RETURN 15FT ADLT (MISCELLANEOUS) ×1 IMPLANT
EVACUATOR SILICONE 100CC (DRAIN) IMPLANT
GAUZE SPONGE 2X2 8PLY STRL LF (GAUZE/BANDAGES/DRESSINGS) ×1 IMPLANT
GLOVE BIO SURGEON STRL SZ7.5 (GLOVE) ×1 IMPLANT
GLOVE INDICATOR 8.0 STRL GRN (GLOVE) ×1 IMPLANT
GOWN STRL REUS W/ TWL XL LVL3 (GOWN DISPOSABLE) ×1 IMPLANT
GOWN STRL REUS W/TWL XL LVL3 (GOWN DISPOSABLE) ×1
GRASPER SUT TROCAR 14GX15 (MISCELLANEOUS) IMPLANT
HEMOSTAT ARISTA ABSORB 3G PWDR (HEMOSTASIS) IMPLANT
HEMOSTAT SNOW SURGICEL 2X4 (HEMOSTASIS) IMPLANT
IRRIG SUCT STRYKERFLOW 2 WTIP (MISCELLANEOUS) ×1
IRRIGATION SUCT STRKRFLW 2 WTP (MISCELLANEOUS) ×1 IMPLANT
KIT BASIN OR (CUSTOM PROCEDURE TRAY) ×1 IMPLANT
KIT TURNOVER KIT A (KITS) IMPLANT
L-HOOK LAP DISP 36CM (ELECTROSURGICAL)
LHOOK LAP DISP 36CM (ELECTROSURGICAL) IMPLANT
POUCH RETRIEVAL ECOSAC 10 (ENDOMECHANICALS) ×1 IMPLANT
POUCH RETRIEVAL ECOSAC 10MM (ENDOMECHANICALS) ×1
SCISSORS LAP 5X35 DISP (ENDOMECHANICALS) ×1 IMPLANT
SET CHOLANGIOGRAPH MIX (MISCELLANEOUS) IMPLANT
SET TUBE SMOKE EVAC HIGH FLOW (TUBING) ×1 IMPLANT
SLEEVE Z-THREAD 5X100MM (TROCAR) ×2 IMPLANT
SPIKE FLUID TRANSFER (MISCELLANEOUS) ×1 IMPLANT
STRIP CLOSURE SKIN 1/2X4 (GAUZE/BANDAGES/DRESSINGS) ×1 IMPLANT
SUT ETHILON 2 0 PS N (SUTURE) IMPLANT
SUT MNCRL AB 4-0 PS2 18 (SUTURE) ×1 IMPLANT
SUT VIC AB 0 UR5 27 (SUTURE) IMPLANT
SUT VICRYL 0 TIES 12 18 (SUTURE) IMPLANT
SUT VICRYL 0 UR6 27IN ABS (SUTURE) IMPLANT
TOWEL OR 17X26 10 PK STRL BLUE (TOWEL DISPOSABLE) ×1 IMPLANT
TOWEL OR NON WOVEN STRL DISP B (DISPOSABLE) ×1 IMPLANT
TRAY LAPAROSCOPIC (CUSTOM PROCEDURE TRAY) ×1 IMPLANT
TROCAR 11X100 Z THREAD (TROCAR) IMPLANT
TROCAR BALLN 12MMX100 BLUNT (TROCAR) ×1 IMPLANT
TROCAR Z-THREAD OPTICAL 5X100M (TROCAR) ×1 IMPLANT

## 2022-11-16 NOTE — Interval H&P Note (Signed)
History and Physical Interval Note:  11/16/2022 7:26 AM  LAURYNN MCCORVEY  has presented today for surgery, with the diagnosis of symptomatic cholethiasis.  The various methods of treatment have been discussed with the patient and family. After consideration of risks, benefits and other options for treatment, the patient has consented to  Procedure(s): LAPAROSCOPIC CHOLECYSTECTOMY WITH ICG DYE, possible INTRAOPERATIVE CHOLANGIOGRAM (N/A) as a surgical intervention.  The patient's history has been reviewed, patient examined, no change in status, stable for surgery.  I have reviewed the patient's chart and labs.  Questions were answered to the patient's satisfaction.    Visit to ED since last clinic visit - pain/n/v. O/w no changes Discussed [facility] surgical resident involvement  Greer Pickerel

## 2022-11-16 NOTE — Anesthesia Postprocedure Evaluation (Signed)
Anesthesia Post Note  Patient: Brenda Contreras  Procedure(s) Performed: LAPAROSCOPIC CHOLECYSTECTOMY WITH ICG DYE     Patient location during evaluation: PACU Anesthesia Type: General Level of consciousness: awake and alert Pain management: pain level controlled Vital Signs Assessment: post-procedure vital signs reviewed and stable Respiratory status: spontaneous breathing, nonlabored ventilation, respiratory function stable and patient connected to nasal cannula oxygen Cardiovascular status: blood pressure returned to baseline and stable Postop Assessment: no apparent nausea or vomiting Anesthetic complications: no  No notable events documented.  Last Vitals:  Vitals:   11/16/22 1045 11/16/22 1130  BP: (!) 122/53 (!) 124/57  Pulse: (!) 57 (!) 58  Resp: 13 15  Temp:    SpO2: 95% 95%    Last Pain:  Vitals:   11/16/22 1130  TempSrc:   PainSc: [place]

## 2022-11-16 NOTE — H&P (Signed)
REFERRING PHYSICIAN: Lewis Shock,*  PROVIDER: Meko Bellanger Leanne Chang, MD  MRN: [id number] DOB: 1951-02-22 DATE OF ENCOUNTER: 11/04/2022  Subjective   Chief Complaint: New Consultation ( eval of gallbladder )   History of Present Illness: Brenda Contreras is a 71 y.o. female who is seen today as an office consultation at the request of Dr. Regenia Skeeter for evaluation of New Consultation ( eval of gallbladder ) .  She is accompanied by her husband. She reports an episode of epigastric and right upper quadrant pain radiating to her back on November 3 prompting her to go to the emergency room. It was associate with nausea and bloating but no vomiting. No fevers or chills. She denies any prior episodes. Since that event she has had some mild episodes but not nearly as intense as the event that prompted her to go to the emergency room. She has been trying to monitor what she eats. No wants the patient. Has had some loose stools over the past few weeks.  Has DM2, ckd stage III, HPL, HTN  Prior abdominal surgeries been tubal ligation. No melena or hematochezia or hematemesis. This discomfort is different than her reflux.    Review of Systems: A complete review of systems was obtained from the patient. I have reviewed this information and discussed as appropriate with the patient. See HPI as well for other ROS.  ROS   Medical History: Past Medical History:  Diagnosis Date  Chronic kidney disease  Diabetes mellitus without complication (CMS-HCC)  GERD (gastroesophageal reflux disease)  Hyperlipidemia  Hypertension   Patient Active Problem List  Diagnosis  Type 2 diabetes mellitus without complications (CMS-HCC)  Gastro-esophageal reflux disease without esophagitis  Hyperlipidemia  Essential (primary) hypertension  Other specified noninflammatory disorders of vagina   Past Surgical History:  Procedure Laterality Date  ESSURE TUBAL LIGATION    No Known Allergies  Current  Outpatient Medications on File Prior to Visit  Medication Sig Dispense Refill  famotidine (PEPCID) 20 MG tablet Take by mouth  losartan (COZAAR) 50 MG tablet Take by mouth  AMLODIPINE BESYLATE (AMLODIPINE ORAL) Take by mouth.  aspirin 81 MG EC tablet Take by mouth.  CARVEDILOL ORAL Take by mouth.  DAPAGLIFLOZIN PROPANEDIOL (FARXIGA ORAL) Take by mouth.  doxycycline (VIBRAMYCIN) 100 MG capsule Take 1 capsule (100 mg total) by mouth 2 (two) times daily. 14 capsule 0  estradiol (VAGIFEM) 10 mcg vaginal tablet Place vaginally.  OLMESARTAN MEDOXOMIL (BENICAR ORAL) Take by mouth.  ROSUVASTATIN CALCIUM (CRESTOR ORAL) Take by mouth.  sitaGLIPtin-metFORMIN (JANUMET) 50-1,000 mg tablet Take 1 tablet by mouth 2 (two) times daily with meals.  triamcinolone 0.1 % cream Apply topically 2 (two) times daily. 45 g 0   No current facility-administered medications on file prior to visit.   Family History  Problem Relation Age of Onset  High blood pressure (Hypertension) Father  Hyperlipidemia (Elevated cholesterol) Father  Coronary Artery Disease (Blocked arteries around heart) Father  Depression Father  Obesity Sister  High blood pressure (Hypertension) Sister  Hyperlipidemia (Elevated cholesterol) Sister  Coronary Artery Disease (Blocked arteries around heart) Sister  Diabetes Sister  Breast cancer Sister    Social History   Tobacco Use  Smoking Status Never  Smokeless Tobacco Never    Social History   Socioeconomic History  Marital status: Married  Tobacco Use  Smoking status: Never  Smokeless tobacco: Never   Objective:   Vitals:  11/04/22 1331  BP: (!) 150/60  Pulse: 88  Temp: 36.1 C (97 F)  SpO2: 98%  Weight: 71.2 kg (157 lb)  Height: 157.5 cm ('5\' 2"'$ )   Body mass index is 28.72 kg/m.  Constitutional: NAD; conversant; no deformities Eyes: Moist conjunctiva; no lid lag; anicteric; PERRL Neck: Trachea midline; no thyromegaly Lungs: Normal respiratory effort; no  tactile fremitus CV: RRR; no palpable thrills; no pitting edema GI: Abd soft, nontender, nondistended; no palpable hepatosplenomegaly MSK: Normal gait; no clubbing/cyanosis Psychiatric: Appropriate affect; alert and oriented x3 Lymphatic: No palpable cervical or axillary lymphadenopathy Skin: No rash, lesions or jaundice  Labs, Imaging and Diagnostic Testing: PCP note 08/23/22 EDP note 10/29/22  Cmet 10/29/22 - Cr 1.25, Tbili 1.6 (same on 06/03/22), LFTs ok Cbc ok  A1c 7.6 08/23/22  CT a/p 05/01/20 - diverticulitis, GS, fatty liver  RUQ u/s 10/29/22 ULTRASOUND ABDOMEN LIMITED RIGHT UPPER QUADRANT   COMPARISON: MRI 06/12/2020   FINDINGS:  Gallbladder:   Distended with intraluminal sludge. Borderline gallbladder wall  thickening at 3 mm. No gallstone. No sonographic Murphy sign noted  by sonographer.   Common bile duct:   Diameter: 4 mm, normal.   Liver:   Diffusely increased parenchymal echogenicity consistent with  steatosis. The liver parenchyma is difficult to penetrate. Areas of  focal fatty sparing adjacent to the gallbladder fossa. No discrete  focal lesion. Portal vein is patent on color Doppler imaging with  normal direction of blood flow towards the liver.   Other: No right upper quadrant ascites.   IMPRESSION:  1. Mild gallbladder distension with borderline wall thickening and  intraluminal sludge. No gallstones or specific findings of acute  cholecystitis.  2. No biliary dilatation.  3. Hepatic steatosis, the liver parenchyma is difficult to  penetrate.   Assessment and Plan:    Diagnoses and all orders for this visit:  Symptomatic cholelithiasis  Essential (primary) hypertension  Type 2 diabetes mellitus without complication, without long-term current use of insulin (CMS-HCC)  Gastro-esophageal reflux disease without esophagitis  Fatty liver    Her ultrasound showed sludge and a prior CT showed cholelithiasis. I think her symptoms are pretty  consistent with biliary dyskinesia. I think less likely would be peptic ulcer/GERD/gastroparesis.  I believe the patient's symptoms are consistent with gallbladder disease.  We discussed gallbladder disease. The patient was given Neurosurgeon. We discussed non-operative and operative management. We discussed the signs & symptoms of acute cholecystitis  I discussed laparoscopic cholecystectomy with possible IOC in detail. The patient was given educational material as well as diagrams detailing the procedure. We discussed the risks and benefits of a laparoscopic cholecystectomy including, but not limited to bleeding, infection, injury to surrounding structures such as the intestine or liver, bile leak, retained gallstones, need to convert to an open procedure, prolonged diarrhea, blood clots such as DVT, common bile duct injury, anesthesia risks, and possible need for additional procedures. We discussed the typical post-operative recovery course. I explained that the likelihood of improvement of their symptoms is good.  Moderate level of medical data review along with decision regarding surgery  No follow-ups on file.  Zonnique Norkus Leanne Chang, MD  General, Minimally Invasive, & Bariatric Surgery

## 2022-11-16 NOTE — Anesthesia Procedure Notes (Signed)
Procedure Name: Intubation Date/Time: 11/16/2022 7:38 AM  Performed by: Claudia Desanctis, CRNAPre-anesthesia Checklist: Patient identified, Emergency Drugs available, Suction available and Patient being monitored Patient Re-evaluated:Patient Re-evaluated prior to induction Oxygen Delivery Method: Circle system utilized Preoxygenation: Pre-oxygenation with 100% oxygen Induction Type: IV induction Ventilation: Mask ventilation without difficulty and Oral airway inserted - appropriate to patient size Laryngoscope Size: 2 and Miller Grade View: Grade I Tube type: Oral Tube size: 7.0 mm Number of attempts: 1 Airway Equipment and Method: Stylet Placement Confirmation: ETT inserted through vocal cords under direct vision, positive ETCO2 and breath sounds checked- equal and bilateral Secured at: 21 cm Tube secured with: Tape Dental Injury: Teeth and Oropharynx as per pre-operative assessment

## 2022-11-16 NOTE — Discharge Instructions (Addendum)
[facility] LAPAROSCOPIC SURGERY: POST OP INSTRUCTIONS Always review your discharge instruction sheet given to you by the facility where your surgery was performed. IF YOU HAVE DISABILITY OR FAMILY LEAVE FORMS, YOU MUST BRING THEM TO THE OFFICE FOR PROCESSING.   DO NOT GIVE THEM TO YOUR DOCTOR.  PAIN CONTROL  First take acetaminophen (Tylenol) AND/or ibuprofen (Advil) to control your pain after surgery.  Follow directions on package.  Taking acetaminophen (Tylenol) and/or ibuprofen (Advil) regularly after surgery will help to control your pain and lower the amount of prescription pain medication you may need.  You should not take more than 3,000 mg (3 grams) of acetaminophen (Tylenol) in 24 hours.  You should not take ibuprofen (Advil), aleve, motrin, naprosyn or other NSAIDS if you have a history of stomach ulcers or chronic kidney disease.  A prescription for pain medication may be given to you upon discharge.  Take your pain medication as prescribed, if you still have uncontrolled pain after taking acetaminophen (Tylenol) or ibuprofen (Advil). Use ice packs to help control pain. If you need a refill on your pain medication, please contact your pharmacy.  They will contact our office to request authorization. Prescriptions will not be filled after 5pm or on week-ends.  HOME MEDICATIONS Take your usually prescribed medications unless otherwise directed.  DIET You should follow a light diet the first few days after arrival home.  Be sure to include lots of fluids daily. Avoid fatty, fried foods.   CONSTIPATION It is common to experience some constipation after surgery and if you are taking pain medication.  Increasing fluid intake and taking a stool softener (such as Colace) will usually help or prevent this problem from occurring.  A mild laxative (Milk of Magnesia or Miralax) should be taken according to package instructions if there are no bowel movements after 48  hours.  WOUND/INCISION CARE Most patients will experience some swelling and bruising in the area of the incisions.  Ice packs will help.  Swelling and bruising can take several days to resolve.  Unless discharge instructions indicate otherwise, follow guidelines below  STERI-STRIPS - you may remove your outer bandages 48 hours after surgery, and you may shower at that time.  You have steri-strips (small skin tapes) in place directly over the incision.  These strips should be left on the skin for 7-10 days.   DERMABOND/SKIN GLUE - you may shower in 24 hours.  The glue will flake off over the next 2-3 weeks. Any sutures or staples will be removed at the office during your follow-up visit.  ACTIVITIES You may resume regular (light) daily activities beginning the next day--such as daily self-care, walking, climbing stairs--gradually increasing activities as tolerated.  You may have sexual intercourse when it is comfortable.  Refrain from any heavy lifting or straining until approved by your doctor. You may drive when you are no longer taking prescription pain medication, you can comfortably wear a seatbelt, and you can safely maneuver your car and apply brakes.  FOLLOW-UP You should see your doctor in the office for a follow-up appointment approximately 2-3 weeks after your surgery.  You should have been given your post-op/follow-up appointment when your surgery was scheduled.  If you did not receive a post-op/follow-up appointment, make sure that you call for this appointment within a day or two after you arrive home to insure a convenient appointment time.  OTHER INSTRUCTIONS   WHEN TO CALL YOUR DOCTOR: Fever over 101.0 Inability to urinate Continued   bleeding from incision. Increased pain, redness, or drainage from the incision. Increasing abdominal pain Green drainage in surgical drain  The clinic staff is available to answer your questions during regular business hours.  Please don't  hesitate to call and ask to speak to one of the nurses for clinical concerns.  If you have a medical emergency, go to the nearest emergency room or call 911.  A surgeon from [facility] is always on call at the hospital. [address], [place], [place], [place]  [address] ? [address], [place], [place]   [address] [phone] ? [phone] ? FAX [phone] Web site: [email or web address]

## 2022-11-16 NOTE — Op Note (Signed)
JAYE SAAL [id number] 06-Feb-1951 11/16/2022  Laparoscopic Cholecystectomy with near infrared fluorescent cholangiography procedure Note  Indications: This patient presents with symptomatic gallbladder disease and will undergo laparoscopic cholecystectomy.  Pre-operative Diagnosis: symptomatic cholelithiasis  Post-operative Diagnosis: severe acute calculous cholecystitis  Surgeon: Greer Pickerel MD FACS  Surgery Resident: Winfield Rast MD  Anesthesia: General endotracheal anesthesia  Procedure Details  The patient was seen again in the Holding Room. The risks, benefits, complications, treatment options, and expected outcomes were discussed with the patient. The possibilities of reaction to medication, pulmonary aspiration, perforation of viscus, bleeding, recurrent infection, finding a normal gallbladder, the need for additional procedures, failure to diagnose a condition, the possible need to convert to an open procedure, and creating a complication requiring transfusion or operation were discussed with the patient. The likelihood of improving the patient's symptoms with return to their baseline status is good.  The patient and/or family concurred with the proposed plan, giving informed consent. The site of surgery properly noted. The patient was taken to Operating Room, identified as OLIVINE HIERS and the procedure verified as Laparoscopic Cholecystectomy with ICG dye.  A Time Out was held and the above information confirmed. Antibiotic prophylaxis was administered.    ICG dye was administered preoperatively.    General endotracheal anesthesia was then administered and tolerated well. After the induction, the abdomen was prepped with Chloraprep and draped in the sterile fashion. The patient was positioned in the supine position.  Local anesthetic agent was injected into the skin near the umbilicus and an incision made by the resident. We dissected down to the abdominal fascia with  blunt dissection.  The fascia was incised vertically and we entered the peritoneal cavity bluntly.  A pursestring suture of 0-Vicryl was placed around the fascial opening.  The Hasson cannula was inserted and secured with the stay suture.  Pneumoperitoneum was then created with CO2 and tolerated well without any adverse changes in the patient's vital signs. An 5-mm port was placed in the subxiphoid position.  Two 5-mm ports were placed in the right upper quadrant. All skin incisions were infiltrated with a local anesthetic agent before making the incision and placing the trocars.   We positioned the patient in reverse Trendelenburg, tilted slightly to the patient's left.   The patient had a significant amount of omentum adhered to the gallbladder and right hepatic edge.  Using a combination of blunt dissection with the suction irrigator catheter and hook electrocautery we were able to free and peel some of the omentum off the gallbladder to reveal the dome of the gallbladder.  This revealed a very thick walled distended gallbladder.  The resident aspirated the gallbladder. the fundus was grasped and retracted cephalad.  I then took over the case and started peeling the omentum off of the body of the gallbladder with the suction irrigator catheter.  The tissue had woody inflammation.    Adhesions were lysed bluntly and with the electrocautery where indicated, taking care not to injure any adjacent organs or viscus. The infundibulum was grasped and retracted laterally.  There was still dense inflamed tissue medially.  Using a combination of the [place] dissector and suction near catheter I continued clearing the tissue.  There was a significant amount of inflammatory rind.  I had freed the entire infundibulum and I was not seeing tubular structures going into the gallbladder.  There was still some inflammatory tissue a little bit more medially.  Near infrared cholangiography was performed.  We identified  fluorescent  activity within the liver, and could see it in the stomach as well as in the common hepatic duct and common bile duct.  There was a structure that had been peeled off the gallbladder that initially looked like inflammatory rind that was opacifying that was going into the common duct.  Upon closer inspection this was a tubular structure that was emptying into the common hepatic/common bile duct.  I was able to place 3 clips across it.  Then additional careful dissection on the gallbladder wall immediately identified a tubular structure that was consistent with a cystic artery.  It was away from the common hepatic and common bile duct.  It did not show any fluorescent activity on near infrared imaging.  2 clips were placed on the downside and it was cauterized as it entered the gallbladder.  The gallbladder was dissected from the liver bed in retrograde fashion with the electrocautery.  The back wall of the gallbladder was densely adhered to the liver.  The gallbladder was entered.  There was no stone spillage.  The gallbladder was removed and placed in an Ecco sac.  The gallbladder and Ecco sac were then removed through the umbilical port site. The liver bed was irrigated and inspected. Hemostasis was achieved with the electrocautery. Copious irrigation was utilized and was repeatedly aspirated until clear.  Did reinspect the clips across the cystic duct stump.  Near infrared cholangiography was again performed.  There is no evidence of leakage of bile.  The clips were across the cystic duct stump and not impinging on the common hepatic or common bile duct.  Did elect to leave a 19 round drain.  It was brought out through the right lateral 5 mm trocar.  The drain was placed in the gallbladder fossa and secured to the skin with a 2-0 nylon.  The pursestring suture was used to close the umbilical fascia.  An additional interrupted 0 Vicryl was placed at the umbilical fascia with the PMI suture passer with  laparoscopic guidance.  Local was infiltrated.  We again inspected the right upper quadrant for hemostasis.  The umbilical closure was inspected and there was no air leak and nothing trapped within the closure. Pneumoperitoneum was released as we removed the trocars.  4-0 Monocryl was used to close the skin by the resident.    steri-strips, and clean dressings were applied. The patient was then extubated and brought to the recovery room in stable condition. Instrument, sponge, and needle counts were correct at closure and at the conclusion of the case.   Findings: Severe Cholecystitis with Cholelithiasis +drain Near infrared cholangiography demonstrated activity in the liver, common hepatic/common bile duct and stomach.  We could identify the cystic duct stump as well and clips were placed across it  Estimated Blood Loss: Minimal         Drains: none         Specimens: Gallbladder           Complications: None; patient tolerated the procedure well.         Disposition: PACU - hemodynamically stable.         Condition: stable  I was personally present, scrubbed and did the entire procedure except where dictated in my operative note above and was immediately available for skin closure, as documented in my operative note.  Leighton Ruff. Redmond Pulling, MD, FACS General, Bariatric, & Minimally Invasive Surgery [facility],  [facility]

## 2022-11-16 NOTE — Progress Notes (Signed)
Sarala RN requested beside report at 1545.

## 2022-11-16 NOTE — Transfer of Care (Signed)
Immediate Anesthesia Transfer of Care Note  Patient: Brenda Contreras  Procedure(s) Performed: LAPAROSCOPIC CHOLECYSTECTOMY WITH ICG DYE  Patient Location: PACU  Anesthesia Type:General  Level of Consciousness: awake and patient cooperative  Airway & Oxygen Therapy: Patient Spontanous Breathing and Patient connected to face mask  Post-op Assessment: Report given to RN and Post -op Vital signs reviewed and stable  Post vital signs: Reviewed and stable  Last Vitals:  Vitals Value Taken Time  BP 105/70 11/16/22 0930  Temp 36.4 C 11/16/22 0930  Pulse 64 11/16/22 0931  Resp 13 11/16/22 0931  SpO2 92 % 11/16/22 0931  Vitals shown include unvalidated device data.  Last Pain:  Vitals:   11/16/22 0600  TempSrc:   PainSc: 0-No pain         Complications: No notable events documented.

## 2022-11-17 ENCOUNTER — Encounter (HOSPITAL_COMMUNITY): Payer: Self-pay | Admitting: General Surgery

## 2022-11-17 DIAGNOSIS — I129 Hypertensive chronic kidney disease with stage 1 through stage 4 chronic kidney disease, or unspecified chronic kidney disease: Secondary | ICD-10-CM | POA: Diagnosis not present

## 2022-11-17 DIAGNOSIS — Z79899 Other long term (current) drug therapy: Secondary | ICD-10-CM | POA: Diagnosis not present

## 2022-11-17 DIAGNOSIS — K8000 Calculus of gallbladder with acute cholecystitis without obstruction: Secondary | ICD-10-CM | POA: Diagnosis not present

## 2022-11-17 DIAGNOSIS — E1122 Type 2 diabetes mellitus with diabetic chronic kidney disease: Secondary | ICD-10-CM | POA: Diagnosis not present

## 2022-11-17 DIAGNOSIS — N1831 Chronic kidney disease, stage 3a: Secondary | ICD-10-CM | POA: Diagnosis not present

## 2022-11-17 DIAGNOSIS — Z7984 Long term (current) use of oral hypoglycemic drugs: Secondary | ICD-10-CM | POA: Diagnosis not present

## 2022-11-17 DIAGNOSIS — Z7982 Long term (current) use of aspirin: Secondary | ICD-10-CM | POA: Diagnosis not present

## 2022-11-17 LAB — CBC
HCT: 34.3 % — ABNORMAL LOW (ref 36.0–46.0)
Hemoglobin: 10.9 g/dL — ABNORMAL LOW (ref 12.0–15.0)
MCH: 28.9 pg (ref 26.0–34.0)
MCHC: 31.8 g/dL (ref 30.0–36.0)
MCV: 91 fL (ref 80.0–100.0)
Platelets: 308 10*3/uL (ref 150–400)
RBC: 3.77 MIL/uL — ABNORMAL LOW (ref 3.87–5.11)
RDW: 14.2 % (ref 11.5–15.5)
WBC: 12.6 10*3/uL — ABNORMAL HIGH (ref 4.0–10.5)
nRBC: 0 % (ref 0.0–0.2)

## 2022-11-17 LAB — COMPREHENSIVE METABOLIC PANEL
ALT: 54 U/L — ABNORMAL HIGH (ref 0–44)
AST: 86 U/L — ABNORMAL HIGH (ref 15–41)
Albumin: 3.4 g/dL — ABNORMAL LOW (ref 3.5–5.0)
Alkaline Phosphatase: 81 U/L (ref 38–126)
Anion gap: 8 (ref 5–15)
BUN: 14 mg/dL (ref 8–23)
CO2: 25 mmol/L (ref 22–32)
Calcium: 9.4 mg/dL (ref 8.9–10.3)
Chloride: 104 mmol/L (ref 98–111)
Creatinine, Ser: 1.22 mg/dL — ABNORMAL HIGH (ref 0.44–1.00)
GFR, Estimated: 47 mL/min — ABNORMAL LOW (ref >60)
Glucose, Bld: 197 mg/dL — ABNORMAL HIGH (ref 70–99)
Potassium: 4.9 mmol/L (ref 3.5–5.1)
Sodium: 137 mmol/L (ref 135–145)
Total Bilirubin: 1.2 mg/dL (ref 0.3–1.2)
Total Protein: 6.8 g/dL (ref 6.5–8.1)

## 2022-11-17 LAB — SURGICAL PATHOLOGY

## 2022-11-17 LAB — GLUCOSE, CAPILLARY
Glucose-Capillary: 190 mg/dL — ABNORMAL HIGH (ref 70–99)
Glucose-Capillary: 173 mg/dL — ABNORMAL HIGH (ref 70–99)

## 2022-11-17 MED ORDER — PANTOPRAZOLE SODIUM 40 MG PO TBEC: 40.0000 mg | DELAYED_RELEASE_TABLET | Freq: Every day | ORAL | Status: DC

## 2022-11-17 MED ORDER — DOCUSATE SODIUM 100 MG PO CAPS: 100.0000 mg | ORAL_CAPSULE | Freq: Two times a day (BID) | ORAL | 0 refills | Status: DC

## 2022-11-17 MED ORDER — OXYCODONE HCL 5 MG PO TABS: 5.0000 mg | ORAL_TABLET | Freq: Four times a day (QID) | ORAL | 0 refills | Status: DC | PRN

## 2022-11-17 NOTE — Progress Notes (Signed)
Mobility Specialist - Progress Note   11/17/22 1130  Mobility  Activity Ambulated independently in hallway  Level of Assistance Independent  Assistive Device None  Distance Ambulated (ft) 300 ft  Activity Response Tolerated well  Mobility Referral Yes  $Mobility charge 1 Mobility   Pt received in bed and agreeable to mobility. No complaints during mobility. Pt to bathroom after session with all needs met.      [facility]

## 2022-11-17 NOTE — Progress Notes (Signed)
  Transition of Care [facility]) Screening Note   Patient Details  Name: Brenda Contreras Date of Birth: 1951/11/18   Transition of Care [facility]) CM/SW Contact:    Vassie Moselle, LCSW Phone Number: 11/17/2022, 10:14 AM    Transition of Care Department [facility]) has reviewed patient and no TOC needs have been identified at this time. We will continue to monitor patient advancement through interdisciplinary progression rounds. If new patient transition needs arise, please place a TOC consult.

## 2022-11-23 ENCOUNTER — Telehealth: Payer: Self-pay

## 2022-11-23 NOTE — Telephone Encounter (Signed)
     Patient  visit on 11/07/2022  at [facility] was for Calculus of bile duct without cholangitis or cholecystitis without obstruction.  Have you been able to follow up with your primary care physician? Patient was referred to a specialist and has had surgery.  The patient was or was not able to obtain any needed medicine or equipment. Patient was able to obtain medications.  Are there diet recommendations that you are having difficulty following? No  Patient expresses understanding of discharge instructions and education provided has no other needs at this time.    [place] Resource Care Guide   [email or web address]  ?? [phone]   Website: [email or web address]  [email or web address]

## 2022-11-24 NOTE — Discharge Summary (Addendum)
[facility] Discharge Summary   Patient ID: VELMER BROADFOOT MRN: [id number] DOB/AGE: [birth year] 71 y.o.  Admit date: 11/16/2022 Discharge date: 11/17/2022  Admitting Diagnosis: Symptomatic cholelithiasis   Discharge Diagnosis Patient Active Problem List   Diagnosis Date Noted   S/P laparoscopic cholecystectomy 11/16/2022   Thumb pain, right 06/03/2022   Fatigue 10/21/2020   Encounter for annual general medical examination with abnormal findings in adult 05/15/2020   Acquired cyst of kidney 05/08/2020   Stage 3a chronic kidney disease ([place]) 11/28/2019   Encounter for diabetic foot exam ([place]) 05/05/2017   Medicare annual wellness visit, initial 04/25/2017   HTN (hypertension) 01/13/2016   GERD (gastroesophageal reflux disease) 01/13/2016   Type 2 diabetes mellitus with hyperglycemia ([place]) 01/13/2016   HLD (hyperlipidemia) 01/13/2016    Consultants None   Imaging/labs: No results found.     Latest Ref Rng & Units 11/17/2022    4:32 AM 11/06/2022    8:02 PM 10/29/2022    9:28 PM  Hepatic Function  Total Protein 6.5 - 8.1 g/dL 6.8  8.3  7.1   Albumin 3.5 - 5.0 g/dL 3.4  4.1  4.0   AST 15 - 41 U/L 86  18  18   ALT 0 - 44 U/L 54  17  18   Alk Phosphatase 38 - 126 U/L 81  74  83   Total Bilirubin 0.3 - 1.2 mg/dL 1.2  1.2  1.6   Bilirubin, Direct 0.0 - 0.2 mg/dL   0.2    Procedures Dr. Greer Pickerel (11/16/22) - Laparoscopic Cholecystectomy with near infrared fluorescent cholangiography   Hospital Course:  Patient presented to [facility] hospital as discussed with Dr. Redmond Pulling in clinic for laparoscopic cholecystectomy due to biliary dyskinesia w/ cholelithiasis.  She was taken for the above operation where severe calculous cholecystitis and a 88F blake drain was left in place at the end of the case.  Patient tolerated the procedure well and was admitted overnight for observation.  Diet was advanced as tolerated. On POD#1 the patient was voiding well, tolerating  diet, ambulating well, pain well controlled, vital signs stable, incisions c/d/i and felt stable for discharge home. Drain was serosanguinous on the day of discharge. Patient will follow up in our office as below and knows to call with questions or concerns.  I have personally reviewed the patients medication history on the [place] controlled substance database.   Physical Exam: General:  Alert, NAD, pleasant, comfortable Abd:  Soft, ND, appropriate incisional tenderness, incisions C/D/I, drain with serosanguinous drainage    Allergies as of 11/17/2022   No Known Allergies      Medication List     STOP taking these medications    HYDROcodone-acetaminophen 5-325 MG tablet Commonly known as: Norco       TAKE these medications    acetaminophen 500 MG tablet Commonly known as: TYLENOL Take 1,000 mg by mouth every 6 (six) hours as needed for moderate pain.   amLODipine 10 MG tablet Commonly known as: NORVASC Take 1 tablet (10 mg total) by mouth daily. For blood pressure   carvedilol 6.25 MG tablet Commonly known as: COREG Take 1 tablet (6.25 mg total) by mouth 2 (two) times daily with a meal. For blood pressure.   CINNAMON PO Take 2 capsules by mouth daily.   dapagliflozin propanediol 5 MG Tabs tablet Commonly known as: FARXIGA Take 1 tablet (5 mg total) by mouth daily before breakfast. for diabetes. Office visit required for further refills.  docusate sodium 100 MG capsule Commonly known as: COLACE Take 1 capsule (100 mg total) by mouth 2 (two) times daily.   famotidine 20 MG tablet Commonly known as: PEPCID Take 1 tablet (20 mg total) by mouth 2 (two) times daily. For heartburn.   HAWTHORNE BERRY PO Take 1 tablet by mouth daily.   losartan 50 MG tablet Commonly known as: COZAAR Take 1 tablet (50 mg total) by mouth daily. For blood pressure.   ondansetron 4 MG disintegrating tablet Commonly known as: ZOFRAN-ODT Take 1 tablet (4 mg total) by mouth every 8 (eight)  hours as needed for nausea or vomiting.   ONE TOUCH LANCETS Misc Use as instructed to test blood sugar daily. Dx is E11.9   ONE TOUCH ULTRA 2 w/Device Kit Use as instructed to test blood sugar daily. Dx is E11.9   OneTouch Ultra test strip Generic drug: glucose blood USE TO CHECK BLOOD SUGAR TWICE DAILY AS INSTRUCTED DX E11.9   oxyCODONE 5 MG immediate release tablet Commonly known as: Oxy IR/ROXICODONE Take 1 tablet (5 mg total) by mouth every 6 (six) hours as needed for moderate pain.   rosuvastatin 20 MG tablet Commonly known as: Crestor Take 1 tablet (20 mg total) by mouth at bedtime. For cholesterol.   sitaGLIPtin-metformin 50-1000 MG tablet Commonly known as: JANUMET Take 1 tablet by mouth 2 (two) times daily with a meal. for diabetes. Office visit required for further refills. What changed: additional instructions          Follow-up Information     Greer Pickerel, MD. Go to.   Specialty: General Surgery Why: call to confirm appointment date/time. Contact information: [address] [place] [address] [phone]         Surgery, [place]. Go on 11/23/2022.   Specialty: General Surgery Why: at 10:30 AM for NURSE visit for Drain removal; pls arrive at 10:15 am Contact information: [address] [place] [address] [phone]                 Signed: Obie Dredge, [facility] 11/24/2022, 3:56 PM

## 2023-01-03 ENCOUNTER — Other Ambulatory Visit: Payer: Self-pay | Admitting: Primary Care

## 2023-01-03 DIAGNOSIS — E119 Type 2 diabetes mellitus without complications: Secondary | ICD-10-CM

## 2023-02-02 ENCOUNTER — Telehealth: Payer: Self-pay | Admitting: Primary Care

## 2023-02-02 ENCOUNTER — Other Ambulatory Visit: Payer: Self-pay | Admitting: Primary Care

## 2023-02-02 DIAGNOSIS — E1165 Type 2 diabetes mellitus with hyperglycemia: Secondary | ICD-10-CM

## 2023-02-02 MED ORDER — SITAGLIPTIN PHOS-METFORMIN HCL 50-1000 MG PO TABS: 1.0000 | ORAL_TABLET | Freq: Two times a day (BID) | ORAL | 0 refills | Status: DC

## 2023-02-02 MED ORDER — DAPAGLIFLOZIN PROPANEDIOL 5 MG PO TABS: 5.0000 mg | ORAL_TABLET | Freq: Every day | ORAL | 0 refills | Status: DC

## 2023-02-02 NOTE — Telephone Encounter (Signed)
Request sent through phone note.

## 2023-02-02 NOTE — Telephone Encounter (Signed)
Prescription Request  02/02/2023  Is this a "Controlled Substance" medicine? No  LOV: 08/23/2022  What is the name of the medication or equipment? dapagliflozin propanediol (FARXIGA) 5 MG TABS tablet   sitaGLIPtin-metformin (JANUMET) 50-1000 MG tablet   Have you contacted your pharmacy to request a refill? Yes   Which pharmacy would you like this sent to?  [facility], [place] [address] Phone: [phone] Fax: [phone]    Patient notified that their request is being sent to the clinical staff for review and that they should receive a response within 2 business days.   Please advise at Mobile [phone] (mobile)  Patient would like to know if a small order can be call into pharmacy of choice,because it will take 2 weeks for her medication to come through mail order and she's almost out?

## 2023-02-02 NOTE — Telephone Encounter (Signed)
From: Della Goo To: Office of Pleas Koch, NP Sent: 02/02/2023 8:18 AM EST Subject: Medication Renewal Request  Refills have been requested for the following medications:   dapagliflozin propanediol (FARXIGA) 5 MG TABS tablet [Manroop Jakubowicz K Christipher Rieger]   sitaGLIPtin-metformin (JANUMET) 50-1000 MG tablet [Kaylia Winborne K Niya Behler]  Preferred pharmacy: [facility] [place], [place] - [address] Delivery method: Mail

## 2023-02-02 NOTE — Telephone Encounter (Signed)
Refills sent to pharmacy. 

## 2023-02-03 MED ORDER — SITAGLIPTIN PHOS-METFORMIN HCL 50-1000 MG PO TABS: 1.0000 | ORAL_TABLET | Freq: Two times a day (BID) | ORAL | 0 refills | Status: DC

## 2023-02-03 MED ORDER — DAPAGLIFLOZIN PROPANEDIOL 5 MG PO TABS: 5.0000 mg | ORAL_TABLET | Freq: Every day | ORAL | 0 refills | Status: DC

## 2023-02-23 ENCOUNTER — Encounter: Payer: Self-pay | Admitting: Primary Care

## 2023-02-23 ENCOUNTER — Ambulatory Visit (INDEPENDENT_AMBULATORY_CARE_PROVIDER_SITE_OTHER): Payer: Medicare HMO | Admitting: Primary Care

## 2023-02-23 VITALS — BP 130/82 | HR 73 | Temp 97.9°F | Ht 62.0 in | Wt 150.0 lb

## 2023-02-23 DIAGNOSIS — E1165 Type 2 diabetes mellitus with hyperglycemia: Secondary | ICD-10-CM | POA: Diagnosis not present

## 2023-02-23 LAB — MICROALBUMIN / CREATININE URINE RATIO
Creatinine,U: 76.4 mg/dL
Microalb Creat Ratio: 3 mg/g (ref 0.0–30.0)
Microalb, Ur: 2.3 mg/dL — ABNORMAL HIGH (ref 0.0–1.9)

## 2023-02-23 LAB — POCT GLYCOSYLATED HEMOGLOBIN (HGB A1C): Hemoglobin A1C: 10.5 % — AB (ref 4.0–5.6)

## 2023-02-23 MED ORDER — DAPAGLIFLOZIN PROPANEDIOL 10 MG PO TABS: 10.0000 mg | ORAL_TABLET | Freq: Every day | ORAL | 1 refills | Status: DC

## 2023-02-23 NOTE — Progress Notes (Signed)
Subjective:    Patient ID: Brenda Contreras, female    DOB: Feb 25, 1951, 72 y.o.   MRN: [id number]  HPI  Brenda Contreras is a very pleasant 72 y.o. female with a history of hypertension, type 2 diabetes, CKD, hyperlipidemia who presents today for follow up of diabetes.  Current medications include: Janumet 50-1000 mg BID, Farxiga 5 mg daily.  She ran out of [place] for about 1 month, resumed about 3 weeks ago.   She is checking her blood glucose 1 times daily and is getting readings of:  AM fasting: 300's - 400's in January 2024. Mid 200's to high 100's in February.  Last A1C: 7.4 in November, 10.5 today Last Eye Exam: UTD Last Foot Exam: UTD Pneumonia Vaccination: 2018 Urine Microalbumin: Due Statin: rosuvastatin   Dietary changes since last visit: Increased snacking on chips and chocolate.    Exercise: None  BP Readings from Last 3 Encounters:  02/23/23 130/82  11/17/22 134/60  11/10/22 116/61       Review of Systems  Cardiovascular:  Negative for chest pain.  Endocrine: Positive for polydipsia and polyuria. Negative for polyphagia.  Neurological:  Negative for dizziness and numbness.         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 colon   Dizziness    GERD (gastroesophageal reflux disease)    High cholesterol    Hypertension    Right hip pain 05/27/2021   Vaginal dryness     Social History   Socioeconomic History   Marital status: Widowed    Spouse name: Not on file   Number of children: Not on file   Years of education: Not on file   Highest education level: Not on file  Occupational History   Not on file  Tobacco Use   Smoking status: Never   Smokeless tobacco: Never  Vaping Use   Vaping Use: Never used  Substance and Sexual Activity   Alcohol use: No   Drug use: No   Sexual activity: Not Currently  Other Topics Concern   Not on  file  Social History Narrative   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      Has two children and two grandchildren.      Does not have a living will.  Desires CPR, would not want prolonged life support if futile.   Social Determinants of Health   Financial Resource Strain: Low Risk  (05/17/2022)   Overall Financial Resource Strain (CARDIA)    Difficulty of Paying Living Expenses: Not hard at all  Food Insecurity: No Food Insecurity (05/13/2021)   Hunger Vital Sign    Worried About Running Out of Food in the Last Year: Never true    Ran Out of Food in the Last Year: Never true  Transportation Needs: No Transportation Needs (05/17/2022)   PRAPARE - Hydrologist (Medical): No    Lack of Transportation (Non-Medical): No  Physical Activity: Inactive (05/17/2022)   Exercise Vital Sign    Days of Exercise per Week: 0 days    Minutes of Exercise per Session: 0 min  Stress: No Stress Concern Present (05/17/2022)   [place]    Feeling of Stress : Not at all  Social Connections: Moderately Integrated (05/17/2022)   Social Connection and Isolation Panel [NHANES]  Frequency of Communication with Friends and Family: Three times a week    Frequency of Social Gatherings with Friends and Family: Twice a week    Attends Religious Services: More than 4 times per year    Active Member of [employer] or Organizations: Yes    Attends Archivist Meetings: More than 4 times per year    Marital Status: Widowed  Intimate Partner Violence: Not At Risk (05/17/2022)   Humiliation, Afraid, Rape, and Kick questionnaire    Fear of Current or Ex-Partner: No    Emotionally Abused: No    Physically Abused: No    Sexually Abused: No    Past Surgical History:  Procedure Laterality Date   APPENDECTOMY     BREAST BIOPSY     CHOLECYSTECTOMY N/A 11/16/2022   Procedure: LAPAROSCOPIC CHOLECYSTECTOMY WITH ICG DYE;   Surgeon: Greer Pickerel, MD;  Location: [facility];  Service: General;  Laterality: N/A;   TUBAL LIGATION      Family History  Problem Relation Age of Onset   Diabetes Father    Hypertension Father    Hyperlipidemia Father    Stroke Father    Diabetes Sister    Hyperlipidemia Sister    Hypertension Sister    Lung cancer Paternal Uncle    Diabetes Paternal Grandmother    Heart disease Paternal Grandfather     No Known Allergies  Current Outpatient Medications on File Prior to Visit  Medication Sig Dispense Refill   acetaminophen (TYLENOL) 500 MG tablet Take 1,000 mg by mouth every 6 (six) hours as needed for moderate pain.     amLODipine (NORVASC) 10 MG tablet Take 1 tablet (10 mg total) by mouth daily. For blood pressure 90 tablet 2   Blood Glucose Monitoring Suppl (ONE TOUCH ULTRA 2) w/Device KIT Use as instructed to test blood sugar daily. Dx is E11.9 1 each 0   carvedilol (COREG) 6.25 MG tablet Take 1 tablet (6.25 mg total) by mouth 2 (two) times daily with a meal. For blood pressure. 180 tablet 2   CINNAMON PO Take 2 capsules by mouth daily.     famotidine (PEPCID) 20 MG tablet Take 1 tablet (20 mg total) by mouth 2 (two) times daily. For heartburn. 180 tablet 2   losartan (COZAAR) 50 MG tablet Take 1 tablet (50 mg total) by mouth daily. For blood pressure. 90 tablet 2   ONE TOUCH LANCETS MISC Use as instructed to test blood sugar daily. Dx is E11.9 200 each 2   ONETOUCH ULTRA test strip USE TO CHECK BLOOD SUGAR TWICE DAILY AS INSTRUCTED DX E11.9 200 each 1   rosuvastatin (CRESTOR) 20 MG tablet Take 1 tablet (20 mg total) by mouth at bedtime. For cholesterol. 90 tablet 2   sitaGLIPtin-metformin (JANUMET) 50-1000 MG tablet Take 1 tablet by mouth 2 (two) times daily with a meal. for diabetes. 180 tablet 0   docusate sodium (COLACE) 100 MG capsule Take 1 capsule (100 mg total) by mouth 2 (two) times daily. (Patient not taking: Reported on 02/23/2023) 10 capsule 0   HAWTHORNE BERRY PO Take  1 tablet by mouth daily. (Patient not taking: Reported on 02/23/2023)     ondansetron (ZOFRAN-ODT) 4 MG disintegrating tablet Take 1 tablet (4 mg total) by mouth every 8 (eight) hours as needed for nausea or vomiting. (Patient not taking: Reported on 02/23/2023) 10 tablet 0   oxyCODONE (OXY IR/ROXICODONE) 5 MG immediate release tablet Take 1 tablet (5 mg total) by mouth every 6 (six)  hours as needed for moderate pain. (Patient not taking: Reported on 02/23/2023) 15 tablet 0   No current facility-administered medications on file prior to visit.    BP 130/82   Pulse 73   Temp 97.9 F (36.6 C) (Temporal)   Ht '5\' 2"'$  (1.575 m)   Wt 150 lb (68 kg)   SpO2 98%   BMI 27.44 kg/m  Objective:   Physical Exam Cardiovascular:     Rate and Rhythm: Normal rate and regular rhythm.  Pulmonary:     Effort: Pulmonary effort is normal.     Breath sounds: Normal breath sounds.  Musculoskeletal:     Cervical back: Neck supple.  Skin:    General: Skin is warm and dry.           Assessment & Plan:  Type 2 diabetes mellitus with hyperglycemia, without long-term current use of insulin (HCC) Assessment & Plan: Uncontrolled, likely a combination of not having meds for 1 month and dietary changes. Fortunately, her glucose levels are reducing, would like to see A1C <7.  Long discussion today regarding her diet, she will work on changes.  Increase Farxiga to 10 mg daily. Continue Janumet 50-1000 mg BID.  Follow up in early June.  She will update sooner if glucose readings do not continue to improve.  Orders: -     Microalbumin / creatinine urine ratio -     POCT glycosylated hemoglobin (Hb A1C) -     Dapagliflozin Propanediol; Take 1 tablet (10 mg total) by mouth daily before breakfast. for diabetes.  Dispense: 90 tablet; Refill: [place], NP

## 2023-02-23 NOTE — Assessment & Plan Note (Signed)
Uncontrolled, likely a combination of not having meds for 1 month and dietary changes. Fortunately, her glucose levels are reducing, would like to see A1C <7.  Long discussion today regarding her diet, she will work on changes.  Increase Farxiga to 10 mg daily. Continue Janumet 50-1000 mg BID.  Follow up in early June.  She will update sooner if glucose readings do not continue to improve.

## 2023-04-18 ENCOUNTER — Other Ambulatory Visit: Payer: Self-pay | Admitting: Primary Care

## 2023-04-18 DIAGNOSIS — E1165 Type 2 diabetes mellitus with hyperglycemia: Secondary | ICD-10-CM

## 2023-04-18 DIAGNOSIS — Z1231 Encounter for screening mammogram for malignant neoplasm of breast: Secondary | ICD-10-CM

## 2023-04-18 MED ORDER — SITAGLIPTIN PHOS-METFORMIN HCL 50-1000 MG PO TABS: 1.0000 | ORAL_TABLET | Freq: Two times a day (BID) | ORAL | 0 refills | Status: DC

## 2023-04-18 NOTE — Telephone Encounter (Signed)
From: Christella Scheuermann To: Office of Doreene Nest, NP Sent: 04/18/2023 6:27 AM EDT Subject: Medication Renewal Request  Refills have been requested for the following medications:   sitaGLIPtin-metformin (JANUMET) 50-1000 MG tablet [Seniyah Esker K Lorayne Getchell]  Preferred pharmacy: [facility] - [place], [place] - [address] Delivery method: Mail

## 2023-06-07 ENCOUNTER — Other Ambulatory Visit: Payer: Self-pay | Admitting: Primary Care

## 2023-06-07 ENCOUNTER — Encounter: Payer: Self-pay | Admitting: Primary Care

## 2023-06-07 ENCOUNTER — Ambulatory Visit (INDEPENDENT_AMBULATORY_CARE_PROVIDER_SITE_OTHER): Payer: Medicare HMO | Admitting: Primary Care

## 2023-06-07 VITALS — BP 136/68 | HR 73 | Temp 97.3°F | Ht 62.0 in | Wt 156.0 lb

## 2023-06-07 DIAGNOSIS — I10 Essential (primary) hypertension: Secondary | ICD-10-CM

## 2023-06-07 DIAGNOSIS — Z7984 Long term (current) use of oral hypoglycemic drugs: Secondary | ICD-10-CM

## 2023-06-07 DIAGNOSIS — K219 Gastro-esophageal reflux disease without esophagitis: Secondary | ICD-10-CM

## 2023-06-07 DIAGNOSIS — N1831 Chronic kidney disease, stage 3a: Secondary | ICD-10-CM

## 2023-06-07 DIAGNOSIS — E785 Hyperlipidemia, unspecified: Secondary | ICD-10-CM

## 2023-06-07 DIAGNOSIS — E1165 Type 2 diabetes mellitus with hyperglycemia: Secondary | ICD-10-CM | POA: Diagnosis not present

## 2023-06-07 DIAGNOSIS — Z0000 Encounter for general adult medical examination without abnormal findings: Secondary | ICD-10-CM

## 2023-06-07 LAB — COMPREHENSIVE METABOLIC PANEL
ALT: 23 U/L (ref 0–35)
AST: 22 U/L (ref 0–37)
Albumin: 4.4 g/dL (ref 3.5–5.2)
Alkaline Phosphatase: 81 U/L (ref 39–117)
BUN: 20 mg/dL (ref 6–23)
CO2: 21 mEq/L (ref 19–32)
Calcium: 9.9 mg/dL (ref 8.4–10.5)
Chloride: 107 mEq/L (ref 96–112)
Creatinine, Ser: 1.1 mg/dL (ref 0.40–1.20)
GFR: 50.01 mL/min — ABNORMAL LOW (ref >60.00)
Glucose, Bld: 144 mg/dL — ABNORMAL HIGH (ref 70–99)
Potassium: 4.4 mEq/L (ref 3.5–5.1)
Sodium: 137 mEq/L (ref 135–145)
Total Bilirubin: 0.8 mg/dL (ref 0.2–1.2)
Total Protein: 7.3 g/dL (ref 6.0–8.3)

## 2023-06-07 LAB — LIPID PANEL
Cholesterol: 191 mg/dL (ref 0–200)
HDL: 58.2 mg/dL (ref >39.00)
NonHDL: 132.47
Total CHOL/HDL Ratio: 3
Triglycerides: 206 mg/dL — ABNORMAL HIGH (ref 0.0–149.0)
VLDL: 41.2 mg/dL — ABNORMAL HIGH (ref 0.0–40.0)

## 2023-06-07 LAB — POCT GLYCOSYLATED HEMOGLOBIN (HGB A1C): Hemoglobin A1C: 7.8 % — AB (ref 4.0–5.6)

## 2023-06-07 LAB — LDL CHOLESTEROL, DIRECT: Direct LDL: 112 mg/dL

## 2023-06-07 MED ORDER — FAMOTIDINE 20 MG PO TABS: 20.0000 mg | ORAL_TABLET | Freq: Two times a day (BID) | ORAL | 2 refills | Status: AC

## 2023-06-07 MED ORDER — LOSARTAN POTASSIUM 50 MG PO TABS: 50.0000 mg | ORAL_TABLET | Freq: Every day | ORAL | 3 refills | Status: AC

## 2023-06-07 MED ORDER — CARVEDILOL 6.25 MG PO TABS: 6.2500 mg | ORAL_TABLET | Freq: Two times a day (BID) | ORAL | 3 refills | Status: AC

## 2023-06-07 MED ORDER — AMLODIPINE BESYLATE 10 MG PO TABS: 10.0000 mg | ORAL_TABLET | Freq: Every day | ORAL | 3 refills | Status: AC

## 2023-06-07 MED ORDER — ROSUVASTATIN CALCIUM 20 MG PO TABS: 20.0000 mg | ORAL_TABLET | Freq: Every day | ORAL | 3 refills | Status: AC

## 2023-06-07 NOTE — Patient Instructions (Signed)
Stop by the lab prior to leaving today. I will notify you of your results once received.   You are due for your bone density scan.  Please schedule a follow up visit for 6 months for a diabetes check.  It was a pleasure to see you today!

## 2023-06-07 NOTE — Assessment & Plan Note (Signed)
Controlled.  Continue famotidine 20 mg BID. 

## 2023-06-07 NOTE — Assessment & Plan Note (Signed)
Improved with A1C of 7.8!  Continue Farxiga 10 mg daily, sitagliptin-metformin 50-1000 mg BID.  Follow up in 6 months.

## 2023-06-07 NOTE — Assessment & Plan Note (Signed)
Immunizations UTD.  Discussed Shingrix vaccines. Mammogram scheduled. Bone density due, discussed with patient, she will have this done at her GYN office Colonoscopy UTD, due 2026  Discussed the importance of a healthy diet and regular exercise in order for weight loss, and to reduce the risk of further co-morbidity.  Exam stable. Labs pending.  Follow up in 1 year for repeat physical.

## 2023-06-07 NOTE — Assessment & Plan Note (Signed)
Repeat renal function pending. 

## 2023-06-07 NOTE — Assessment & Plan Note (Signed)
Controlled.  Continue losartan 50 mg daily, carvedilol 6.25 mg BID. CMP pending.

## 2023-06-07 NOTE — Assessment & Plan Note (Signed)
Repeat lipid panel pending. ? ?Continue rosuvastatin 20 mg daily. ?

## 2023-06-07 NOTE — Progress Notes (Signed)
Subjective:    Patient ID: Brenda Contreras, female    DOB: December 16, 1951, 72 y.o.   MRN: [id number]  HPI  Brenda Contreras is a very pleasant 72 y.o. female who presents today for complete physical and follow up of chronic conditions.  Immunizations: -Tetanus: Completed in 2017 -Shingles: Completed Zostavax -Pneumonia: Completed Prevnar 13 in 2017, Pneumovax 23 in 2018  Diet: Fair diet.  Exercise: No regular exercise.   Eye exam: Completes annually  Dental exam: Completes semi-annually    Mammogram: Scheduled for 06/24/2023 Bone Density Scan: Completed in 2020, she will have this done at GYN office.   Colonoscopy: Completed in 2019, due 2026  BP Readings from Last 3 Encounters:  06/07/23 136/68  02/23/23 130/82  11/17/22 134/60    Wt Readings from Last 3 Encounters:  06/07/23 156 lb (70.8 kg)  02/23/23 150 lb (68 kg)  11/16/22 153 lb (69.4 kg)      Review of Systems  Constitutional:  Negative for unexpected weight change.  HENT:  Negative for rhinorrhea.   Respiratory:  Negative for cough and shortness of breath.   Cardiovascular:  Negative for chest pain.  Gastrointestinal:  Negative for constipation and diarrhea.  Genitourinary:  Negative for difficulty urinating.  Musculoskeletal:  Negative for arthralgias and myalgias.  Skin:  Negative for rash.  Allergic/Immunologic: Negative for environmental allergies.  Neurological:  Positive for numbness. Negative for dizziness and headaches.  Psychiatric/Behavioral:  The patient is not nervous/anxious.          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   Dizziness    GERD (gastroesophageal reflux disease)    High cholesterol    Hypertension    Right hip pain 05/27/2021   S/P laparoscopic cholecystectomy 11/16/2022   Vaginal dryness     Social History   Socioeconomic  History   Marital status: Widowed    Spouse name: Not on file   Number of children: Not on file   Years of education: Not on file   Highest education level: Not on file  Occupational History   Not on file  Tobacco Use   Smoking status: Never   Smokeless tobacco: Never  Vaping Use   Vaping Use: Never used  Substance and Sexual Activity   Alcohol use: No   Drug use: No   Sexual activity: Not Currently  Other Topics Concern   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      Has two children and two grandchildren.      Does not have a living will.  Desires CPR, would not want prolonged life support if futile.   Social Determinants of Health   Financial Resource Strain: Low Risk  (05/17/2022)   Overall Financial Resource Strain (CARDIA)    Difficulty of Paying Living Expenses: Not hard at all  Food Insecurity: No Food Insecurity (05/13/2021)   Hunger Vital Sign    Worried About Running Out of Food in the Last Year: Never true    Ran Out of Food in the Last Year: Never true  Transportation Needs: No Transportation Needs (05/17/2022)   PRAPARE - Administrator, Civil Service (Medical): No    Lack of Transportation (Non-Medical): No  Physical Activity: Inactive (05/17/2022)   Exercise Vital Sign  Days of Exercise per Week: 0 days    Minutes of Exercise per Session: 0 min  Stress: No Stress Concern Present (05/17/2022)   [employer] of Occupational Health - Occupational Stress Questionnaire    Feeling of Stress : Not at all  Social Connections: Moderately Integrated (05/17/2022)   Social Connection and Isolation Panel [NHANES]    Frequency of Communication with Friends and Family: Three times a week    Frequency of Social Gatherings with Friends and Family: Twice a week    Attends Religious Services: More than 4 times per year    Active Member of [employer] or Organizations: Yes    Attends Banker Meetings: More than 4  times per year    Marital Status: Widowed  Intimate Partner Violence: Not At Risk (05/17/2022)   Humiliation, Afraid, Rape, and Kick questionnaire    Fear of Current or Ex-Partner: No    Emotionally Abused: No    Physically Abused: No    Sexually Abused: No    Past Surgical History:  Procedure Laterality Date   APPENDECTOMY     BREAST BIOPSY     CHOLECYSTECTOMY N/A 11/16/2022   Procedure: LAPAROSCOPIC CHOLECYSTECTOMY WITH ICG DYE;  Surgeon: Gaynelle Adu, MD;  Location: [facility];  Service: General;  Laterality: N/A;   TUBAL LIGATION      Family History  Problem Relation Age of Onset   Diabetes Father    Hypertension Father    Hyperlipidemia Father    Stroke Father    Diabetes Sister    Hyperlipidemia Sister    Hypertension Sister    Lung cancer Paternal Uncle    Diabetes Paternal Grandmother    Heart disease Paternal Grandfather     Not on File  Current Outpatient Medications on File Prior to Visit  Medication Sig Dispense Refill   amLODipine (NORVASC) 10 MG tablet Take 1 tablet (10 mg total) by mouth daily. For blood pressure 90 tablet 2   Blood Glucose Monitoring Suppl (ONE TOUCH ULTRA 2) w/Device KIT Use as instructed to test blood sugar daily. Dx is E11.9 1 each 0   carvedilol (COREG) 6.25 MG tablet Take 1 tablet (6.25 mg total) by mouth 2 (two) times daily with a meal. For blood pressure. 180 tablet 2   CINNAMON PO Take 2 capsules by mouth daily.     dapagliflozin propanediol (FARXIGA) 10 MG TABS tablet Take 1 tablet (10 mg total) by mouth daily before breakfast. for diabetes. 90 tablet 1   famotidine (PEPCID) 20 MG tablet Take 1 tablet (20 mg total) by mouth 2 (two) times daily. For heartburn. 180 tablet 2   losartan (COZAAR) 50 MG tablet Take 1 tablet (50 mg total) by mouth daily. For blood pressure. 90 tablet 2   ONE TOUCH LANCETS MISC Use as instructed to test blood sugar daily. Dx is E11.9 200 each 2   ONETOUCH ULTRA test strip USE TO CHECK BLOOD SUGAR TWICE DAILY  AS INSTRUCTED DX E11.9 200 each 1   rosuvastatin (CRESTOR) 20 MG tablet Take 1 tablet (20 mg total) by mouth at bedtime. For cholesterol. 90 tablet 2   sitaGLIPtin-metformin (JANUMET) 50-1000 MG tablet Take 1 tablet by mouth 2 (two) times daily with a meal. for diabetes. 180 tablet 0   No current facility-administered medications on file prior to visit.    BP 136/68   Pulse 73   Temp (!) 97.3 F (36.3 C) (Temporal)   Ht 5\' 2"  (1.575 m)   Wt 156  lb (70.8 kg)   SpO2 98%   BMI 28.53 kg/m  Objective:   Physical Exam HENT:     Right Ear: Tympanic membrane and ear canal normal.     Left Ear: Tympanic membrane and ear canal normal.     Nose: Nose normal.  Eyes:     Conjunctiva/sclera: Conjunctivae normal.     Pupils: Pupils are equal, round, and reactive to light.  Neck:     Thyroid: No thyromegaly.  Cardiovascular:     Rate and Rhythm: Normal rate and regular rhythm.     Heart sounds: No murmur heard. Pulmonary:     Effort: Pulmonary effort is normal.     Breath sounds: Normal breath sounds. No rales.  Abdominal:     General: Bowel sounds are normal.     Palpations: Abdomen is soft.     Tenderness: There is no abdominal tenderness.  Musculoskeletal:        General: Normal range of motion.     Cervical back: Neck supple.  Lymphadenopathy:     Cervical: No cervical adenopathy.  Skin:    General: Skin is warm and dry.     Findings: No rash.  Neurological:     Mental Status: She is alert and oriented to person, place, and time.     Cranial Nerves: No cranial nerve deficit.     Deep Tendon Reflexes: Reflexes are normal and symmetric.  Psychiatric:        Mood and Affect: Mood normal.           Assessment & Plan:  Preventative health care Assessment & Plan: Immunizations UTD.  Discussed Shingrix vaccines. Mammogram scheduled. Bone density due, discussed with patient, she will have this done at her GYN office Colonoscopy UTD, due 2026  Discussed the importance of  a healthy diet and regular exercise in order for weight loss, and to reduce the risk of further co-morbidity.  Exam stable. Labs pending.  Follow up in 1 year for repeat physical.    Type 2 diabetes mellitus with hyperglycemia, without long-term current use of insulin (HCC) Assessment & Plan: Improved with A1C of 7.8!  Continue Farxiga 10 mg daily, sitagliptin-metformin 50-1000 mg BID.  Follow up in 6 months.  Orders: -     POCT glycosylated hemoglobin (Hb A1C)  Primary hypertension Assessment & Plan: Controlled.  Continue losartan 50 mg daily, carvedilol 6.25 mg BID. CMP pending.  Orders: -     Comprehensive metabolic panel  Gastroesophageal reflux disease, unspecified whether esophagitis present Assessment & Plan: Controlled.  Continue famotidine 20 mg BID.    Hyperlipidemia, unspecified hyperlipidemia type Assessment & Plan: Repeat lipid panel pending.  Continue rosuvastatin 20 mg daily.  Orders: -     Lipid panel  Stage 3a chronic kidney disease (HCC) Assessment & Plan: Repeat renal function pending.         Doreene Nest, NP

## 2023-06-16 ENCOUNTER — Encounter: Payer: Self-pay | Admitting: Obstetrics and Gynecology

## 2023-06-23 ENCOUNTER — Ambulatory Visit (INDEPENDENT_AMBULATORY_CARE_PROVIDER_SITE_OTHER): Payer: Medicare HMO | Admitting: Primary Care

## 2023-06-23 ENCOUNTER — Encounter: Payer: Self-pay | Admitting: Primary Care

## 2023-06-23 VITALS — BP 150/80 | HR 95 | Temp 97.6°F | Ht 62.0 in | Wt 153.0 lb

## 2023-06-23 DIAGNOSIS — J069 Acute upper respiratory infection, unspecified: Secondary | ICD-10-CM

## 2023-06-23 MED ORDER — HYDROCOD POLI-CHLORPHE POLI ER 10-8 MG/5ML PO SUER
5.0000 mL | Freq: Two times a day (BID) | ORAL | 0 refills | Status: DC | PRN
Start: 2023-06-23 — End: 2023-10-24

## 2023-06-23 NOTE — Progress Notes (Signed)
Subjective:    Patient ID: Brenda Contreras, female    DOB: 04-13-1951, 72 y.o.   MRN: [id number]  Cough Associated symptoms include headaches, postnasal drip and a sore throat. Pertinent negatives include no chills or fever.    Brenda Contreras is a very pleasant 72 y.o. female with a history of hypertension, type 2 diabetes, CKD, hyperlipidemia who presents today to discuss cough.  Symptom onset 3 days ago with dry cough, "head congestion" and post nasal drip. Since then her symptoms began feeling worse. She continues to experience a cough, right temporal headache, and drainage.   She's been taking "nasal decongestant" and "tussin" without much improvement. Her cough is her most bothersome symptom which is present during the day and evening.   She's taken 2 home Covid-19 tests, last one was this morning, both were negative.  She denies fevers, sick contacts.    Review of Systems  Constitutional:  Positive for fatigue. Negative for chills and fever.  HENT:  Positive for congestion, postnasal drip and sore throat.   Respiratory:  Positive for cough.   Neurological:  Positive for headaches.         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   Dizziness    GERD (gastroesophageal reflux disease)    High cholesterol    Hypertension    Right hip pain 05/27/2021   S/P laparoscopic cholecystectomy 11/16/2022   Vaginal dryness     Social History   Socioeconomic History   Marital status: Widowed    Spouse name: Not on file   Number of children: Not on file   Years of education: Not on file   Highest education level: Not on file  Occupational History   Not on file  Tobacco Use   Smoking status: Never   Smokeless tobacco: Never  Vaping Use   Vaping Use: Never used  Substance and Sexual Activity   Alcohol use: No   Drug use: No    Sexual activity: Not Currently  Other Topics Concern   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      Has two children and two grandchildren.      Does not have a living will.  Desires CPR, would not want prolonged life support if futile.   Social Determinants of Health   Financial Resource Strain: Low Risk  (05/17/2022)   Overall Financial Resource Strain (CARDIA)    Difficulty of Paying Living Expenses: Not hard at all  Food Insecurity: No Food Insecurity (05/13/2021)   Hunger Vital Sign    Worried About Running Out of Food in the Last Year: Never true    Ran Out of Food in the Last Year: Never true  Transportation Needs: No Transportation Needs (05/17/2022)   PRAPARE - Administrator, Civil Service (Medical): No    Lack of Transportation (Non-Medical): No  Physical Activity: Inactive (05/17/2022)   Exercise Vital Sign    Days of Exercise per Week: 0 days    Minutes of Exercise per Session: 0 min  Stress: No Stress Concern Present (05/17/2022)   [employer] of Occupational Health - Occupational Stress Questionnaire    Feeling of Stress : Not at all  Social Connections: Moderately Integrated (05/17/2022)   Social Connection and Isolation Panel [  NHANES]    Frequency of Communication with Friends and Family: Three times a week    Frequency of Social Gatherings with Friends and Family: Twice a week    Attends Religious Services: More than 4 times per year    Active Member of [employer] or Organizations: Yes    Attends Banker Meetings: More than 4 times per year    Marital Status: Widowed  Intimate Partner Violence: Not At Risk (05/17/2022)   Humiliation, Afraid, Rape, and Kick questionnaire    Fear of Current or Ex-Partner: No    Emotionally Abused: No    Physically Abused: No    Sexually Abused: No    Past Surgical History:  Procedure Laterality Date   APPENDECTOMY     BREAST BIOPSY     CHOLECYSTECTOMY N/A 11/16/2022    Procedure: LAPAROSCOPIC CHOLECYSTECTOMY WITH ICG DYE;  Surgeon: Gaynelle Adu, MD;  Location: [facility];  Service: General;  Laterality: N/A;   TUBAL LIGATION      Family History  Problem Relation Age of Onset   Diabetes Father    Hypertension Father    Hyperlipidemia Father    Stroke Father    Diabetes Sister    Hyperlipidemia Sister    Hypertension Sister    Lung cancer Paternal Uncle    Diabetes Paternal Grandmother    Heart disease Paternal Grandfather     No Known Allergies  Current Outpatient Medications on File Prior to Visit  Medication Sig Dispense Refill   amLODipine (NORVASC) 10 MG tablet Take 1 tablet (10 mg total) by mouth daily. For blood pressure 90 tablet 3   Blood Glucose Monitoring Suppl (ONE TOUCH ULTRA 2) w/Device KIT Use as instructed to test blood sugar daily. Dx is E11.9 1 each 0   carvedilol (COREG) 6.25 MG tablet Take 1 tablet (6.25 mg total) by mouth 2 (two) times daily with a meal. For blood pressure. 180 tablet 3   CINNAMON PO Take 2 capsules by mouth daily.     dapagliflozin propanediol (FARXIGA) 10 MG TABS tablet Take 1 tablet (10 mg total) by mouth daily before breakfast. for diabetes. 90 tablet 1   famotidine (PEPCID) 20 MG tablet Take 1 tablet (20 mg total) by mouth 2 (two) times daily. For heartburn. 180 tablet 2   losartan (COZAAR) 50 MG tablet Take 1 tablet (50 mg total) by mouth daily. For blood pressure. 90 tablet 3   Misc Natural Products (YUMVS BEET ROOT-TART CHERRY PO) Take by mouth.     ONE TOUCH LANCETS MISC Use as instructed to test blood sugar daily. Dx is E11.9 200 each 2   ONETOUCH ULTRA test strip USE TO CHECK BLOOD SUGAR TWICE DAILY AS INSTRUCTED DX E11.9 200 each 1   rosuvastatin (CRESTOR) 20 MG tablet Take 1 tablet (20 mg total) by mouth at bedtime. For cholesterol. 90 tablet 3   sitaGLIPtin-metformin (JANUMET) 50-1000 MG tablet Take 1 tablet by mouth 2 (two) times daily with a meal. for diabetes. 180 tablet 0   No current  facility-administered medications on file prior to visit.    BP (!) 150/80   Pulse 95   Temp 97.6 F (36.4 C) (Temporal)   Ht 5\' 2"  (1.575 m)   Wt 153 lb (69.4 kg)   SpO2 98%   BMI 27.98 kg/m  Objective:   Physical Exam Constitutional:      Appearance: She is ill-appearing.  HENT:     Right Ear: Tympanic membrane and ear canal normal.  Left Ear: Tympanic membrane and ear canal normal.     Nose:     Right Sinus: No maxillary sinus tenderness or frontal sinus tenderness.     Left Sinus: No maxillary sinus tenderness or frontal sinus tenderness.     Mouth/Throat:     Pharynx: No posterior oropharyngeal erythema.  Eyes:     Conjunctiva/sclera: Conjunctivae normal.  Cardiovascular:     Rate and Rhythm: Normal rate and regular rhythm.  Pulmonary:     Effort: Pulmonary effort is normal.     Breath sounds: Normal breath sounds. No wheezing or rales.     Comments: Congested cough noted during visit Musculoskeletal:     Cervical back: Neck supple.  Lymphadenopathy:     Cervical: No cervical adenopathy.  Skin:    General: Skin is warm and dry.           Assessment & Plan:  Viral URI with cough Assessment & Plan: Exam and HPI consistent with viral etiology at this point. Fortunately, lung sounds are clear.  We discussed the typical trajectory of viral illness and that her symptoms will get worse before they get better. We will treat conservatively for now.  Ibuprofen or Tylenol as needed for headaches.  Offered cough suppressant, she prefers something to help her sleep. Prescription for Tussionex sent to pharmacy to use twice daily as needed.  Drowsiness precautions provided.  She will contact us Monday next week if her symptoms have not improved, or have progressed.  Orders: -     Hydrocod Poli-Chlorphe Poli ER; Take 5 mLs by mouth every 12 (twelve) hours as needed for cough.  Dispense: 50 mL; Refill: 0        Doreene Nest, NP

## 2023-06-23 NOTE — Patient Instructions (Signed)
You may take the cough suppressant every 12 hours as needed for cough and rest. Caution this medication contains codeine which may cause drowsiness.   You can take ibuprofen or Tylenol as needed for the headache.  Nasal Congestion/Ear Pressure/Sinus Pressure: Try using Flonase (fluticasone) nasal spray. Instill 1 spray in each nostril twice daily.   Please notify me Monday next week if your symptoms are no better.  It was a pleasure to see you today!

## 2023-06-23 NOTE — Assessment & Plan Note (Signed)
Exam and HPI consistent with viral etiology at this point. Fortunately, lung sounds are clear.  We discussed the typical trajectory of viral illness and that her symptoms will get worse before they get better. We will treat conservatively for now.  Ibuprofen or Tylenol as needed for headaches.  Offered cough suppressant, she prefers something to help her sleep. Prescription for Tussionex sent to pharmacy to use twice daily as needed.  Drowsiness precautions provided.  She will contact us Monday next week if her symptoms have not improved, or have progressed.

## 2023-06-24 ENCOUNTER — Ambulatory Visit
Admission: RE | Admit: 2023-06-24 | Discharge: 2023-06-24 | Disposition: A | Discharge status: Home / Self Care | Payer: Medicare HMO | Source: Ambulatory Visit | Attending: Primary Care | Admitting: Primary Care

## 2023-06-24 DIAGNOSIS — Z1231 Encounter for screening mammogram for malignant neoplasm of breast: Secondary | ICD-10-CM

## 2023-07-27 ENCOUNTER — Encounter (INDEPENDENT_AMBULATORY_CARE_PROVIDER_SITE_OTHER): Payer: Self-pay

## 2023-08-10 ENCOUNTER — Ambulatory Visit (INDEPENDENT_AMBULATORY_CARE_PROVIDER_SITE_OTHER): Payer: Medicare HMO

## 2023-08-10 VITALS — Ht 63.0 in | Wt 153.0 lb

## 2023-08-10 DIAGNOSIS — Z0000 Encounter for general adult medical examination without abnormal findings: Secondary | ICD-10-CM

## 2023-08-10 NOTE — Progress Notes (Signed)
Subjective:   Brenda Contreras is a 72 y.o. female who presents for Medicare Annual (Subsequent) preventive examination.  Visit Complete: Virtual  I connected with  Brenda Contreras on 08/10/23 by a audio enabled telemedicine application and verified that I am speaking with the correct person using two identifiers.  Patient Location: Home  Provider Location: Office/Clinic  I discussed the limitations of evaluation and management by telemedicine. The patient expressed understanding and agreed to proceed.  Vital Signs: Unable to obtain new vitals due to this being a telehealth visit. Pt reported ht and wt.  Review of Systems      Cardiac Risk Factors include: advanced age (>31men, >42 women);hypertension;diabetes mellitus;dyslipidemia;sedentary lifestyle     Objective:    Today's Vitals   08/10/23 1322  Weight: 153 lb (69.4 kg)  Height: 5\' 3"  (1.6 m)   Body mass index is 27.1 kg/m.     08/10/2023    1:28 PM 11/16/2022    5:56 AM 11/10/2022   12:57 PM 11/06/2022    4:03 PM 10/29/2022    8:20 PM 05/17/2022    1:08 PM 05/13/2021    9:48 AM  Advanced Directives  Does Patient Have a Medical Advance Directive? No No No No No No Yes  Type of Tax inspector;Living will  Copy of Healthcare Power of Attorney in Chart?       No - copy requested  Would patient like information on creating a medical advance directive? No - Patient declined No - Patient declined No - Patient declined No - Patient declined No - Patient declined No - Patient declined     Current Medications (verified) Outpatient Encounter Medications as of 08/10/2023  Medication Sig   amLODipine (NORVASC) 10 MG tablet Take 1 tablet (10 mg total) by mouth daily. For blood pressure   Blood Glucose Monitoring Suppl (ONE TOUCH ULTRA 2) w/Device KIT Use as instructed to test blood sugar daily. Dx is E11.9   carvedilol (COREG) 6.25 MG tablet Take 1 tablet (6.25 mg total) by mouth 2 (two)  times daily with a meal. For blood pressure.   CINNAMON PO Take 2 capsules by mouth daily.   dapagliflozin propanediol (FARXIGA) 10 MG TABS tablet Take 1 tablet (10 mg total) by mouth daily before breakfast. for diabetes.   famotidine (PEPCID) 20 MG tablet Take 1 tablet (20 mg total) by mouth 2 (two) times daily. For heartburn.   losartan (COZAAR) 50 MG tablet Take 1 tablet (50 mg total) by mouth daily. For blood pressure.   Misc Natural Products (YUMVS BEET ROOT-TART CHERRY PO) Take by mouth.   ONE TOUCH LANCETS MISC Use as instructed to test blood sugar daily. Dx is E11.9   ONETOUCH ULTRA test strip USE TO CHECK BLOOD SUGAR TWICE DAILY AS INSTRUCTED DX E11.9   rosuvastatin (CRESTOR) 20 MG tablet Take 1 tablet (20 mg total) by mouth at bedtime. For cholesterol.   sitaGLIPtin-metformin (JANUMET) 50-1000 MG tablet Take 1 tablet by mouth 2 (two) times daily with a meal. for diabetes.   chlorpheniramine-HYDROcodone (TUSSIONEX) 10-8 MG/5ML Take 5 mLs by mouth every 12 (twelve) hours as needed for cough. (Patient not taking: Reported on 08/10/2023)   No facility-administered encounter medications on file as of 08/10/2023.    Allergies (verified) Patient has no known allergies.   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   Dizziness    GERD (gastroesophageal reflux disease)    High cholesterol    Hypertension    Right hip pain 05/27/2021   S/P laparoscopic cholecystectomy 11/16/2022   Vaginal dryness    Past Surgical History:  Procedure Laterality Date   APPENDECTOMY     BREAST BIOPSY     CHOLECYSTECTOMY N/A 11/16/2022   Procedure: LAPAROSCOPIC CHOLECYSTECTOMY WITH ICG DYE;  Surgeon: Gaynelle Adu, MD;  Location: [facility];  Service: General;  Laterality: N/A;   TUBAL LIGATION     Family History  Problem Relation Age of Onset   Diabetes Father     Hypertension Father    Hyperlipidemia Father    Stroke Father    Breast cancer Sister    Diabetes Sister    Hyperlipidemia Sister    Hypertension Sister    Lung cancer Paternal Uncle    Diabetes Paternal Grandmother    Heart disease Paternal Grandfather    Social History   Socioeconomic History   Marital status: Married    Spouse name: Not on file   Number of children: Not on file   Years of education: Not on file   Highest education level: Not on file  Occupational History   Not on file  Tobacco Use   Smoking status: Never   Smokeless tobacco: Never  Vaping Use   Vaping status: Never Used  Substance and Sexual Activity   Alcohol use: No   Drug use: No   Sexual activity: Not Currently  Other Topics Concern   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      Has two children and two grandchildren.      Does not have a living will.  Desires CPR, would not want prolonged life support if futile.   Social Determinants of Health   Financial Resource Strain: Low Risk  (08/10/2023)   Overall Financial Resource Strain (CARDIA)    Difficulty of Paying Living Expenses: Not hard at all  Food Insecurity: No Food Insecurity (08/10/2023)   Hunger Vital Sign    Worried About Running Out of Food in the Last Year: Never true    Ran Out of Food in the Last Year: Never true  Transportation Needs: No Transportation Needs (08/10/2023)   PRAPARE - Administrator, Civil Service (Medical): No    Lack of Transportation (Non-Medical): No  Physical Activity: Inactive (08/10/2023)   Exercise Vital Sign    Days of Exercise per Week: 0 days    Minutes of Exercise per Session: 0 min  Stress: No Stress Concern Present (08/10/2023)   [employer] of Occupational Health - Occupational Stress Questionnaire    Feeling of Stress : Not at all  Social Connections: Moderately Integrated (08/10/2023)   Social Connection and Isolation Panel [NHANES]     Frequency of Communication with Friends and Family: More than three times a week    Frequency of Social Gatherings with Friends and Family: More than three times a week    Attends Religious Services: More than 4 times per year    Active Member of [employer] or Organizations: No    Attends Banker Meetings: Never    Marital Status: Married    Tobacco Counseling Counseling given: Not Answered   Clinical Intake:  Pre-visit preparation completed: Yes  Pain : No/denies pain  BMI - recorded: 27.1 Nutritional Risks: None Diabetes: Yes CBG done?: Yes (186 per pt) CBG resulted in Enter/ Edit results?: No Did pt. bring in CBG monitor from home?: No  How often do you need to have someone help you when you read instructions, pamphlets, or other written materials from your doctor or pharmacy?: 1 - Never  Interpreter Needed?: No  Information entered by :: C. LPN   Activities of Daily Living    08/10/2023    1:29 PM 11/16/2022    5:18 PM  In your present state of health, do you have any difficulty performing the following activities:  Hearing? 0 0  Vision? 0 0  Difficulty concentrating or making decisions? 0 0  Walking or climbing stairs? 0 0  Dressing or bathing? 0 0  Doing errands, shopping? 0 0  Preparing Food and eating ? N   Using the Toilet? N   In the past six months, have you accidently leaked urine? N   Do you have problems with loss of bowel control? N   Managing your Medications? N   Managing your Finances? N   Housekeeping or managing your Housekeeping? N     Patient Care Team: Doreene Nest, NP as PCP - General (Internal Medicine) Yates Decamp, MD as Consulting Physician (Cardiology) Zelphia Cairo, MD as Consulting Physician (Obstetrics and Gynecology) Sallye Lat, MD as Consulting Physician (Ophthalmology)  Indicate any recent Medical Services you may have received from other than [facility] providers in the past year (date may be  approximate).     Assessment:   This is a routine wellness examination for Brenda Contreras.  Hearing/Vision screen Hearing Screening - Comments:: Denies hearing difficulties   Vision Screening - Comments:: Readers - UTD on eye exams at [facility].  Dietary issues and exercise activities discussed:     Goals Addressed             This Visit's Progress    Patient Stated       Get HGBa1c down, start exercising more.       Depression Screen    08/10/2023    1:23 PM 06/23/2023   10:38 AM 06/07/2023    9:11 AM 02/23/2023    9:15 AM 05/17/2022    1:13 PM 05/13/2021    9:49 AM 05/05/2021   11:18 AM  PHQ 2/9 Scores  PHQ - 2 Score 0 0 0 0 0 0 0  PHQ- 9 Score 0 0    0 0    Fall Risk    08/10/2023    1:29 PM 06/07/2023    9:11 AM 02/23/2023    9:15 AM 05/17/2022    1:08 PM 05/13/2021    9:49 AM  Fall Risk   Falls in the past year? 0 0 0 0 0  Number falls in past yr: 0 0 0 0 0  Injury with Fall? 0 0 0 0 0  Risk for fall due to : No Fall Risks No Fall Risks No Fall Risks  Medication side effect  Follow up Falls prevention discussed;Falls evaluation completed Falls evaluation completed Falls evaluation completed Falls evaluation completed;Education provided;Falls prevention discussed Falls evaluation completed;Falls prevention discussed    MEDICARE RISK AT HOME:  Medicare Risk at Home - 08/10/23 1329     Any stairs in or around the home? Yes    If so, are there any without handrails? No    Home free of loose throw rugs in walkways, pet beds, electrical cords, etc?  Yes    Adequate lighting in your home to reduce risk of falls? Yes    Life alert? No    Use of a cane, walker or w/c? No    Grab bars in the bathroom? Yes    Shower chair or bench in shower? Yes    Elevated toilet seat or a handicapped toilet? Yes             TIMED UP AND GO:  Was the test performed?  No    Cognitive Function:    05/13/2021    9:55 AM 05/12/2020    9:51 AM 05/09/2019    6:00 PM 05/04/2018    10:33 AM  MMSE - Mini Mental State Exam  Orientation to time 5 5 5 5   Orientation to Place 5 5 5 5   Registration 3 3 3 3   Attention/ Calculation 5 5 0 0  Recall 3 3 3 3   Language- name 2 objects   0 0  Language- repeat 1 1 1 1   Language- follow 3 step command   0 3  Language- read & follow direction   0 0  Write a sentence   0 0  Copy design   0 0  Total score   17 20        08/10/2023    1:30 PM 05/17/2022    1:09 PM  6CIT Screen  What Year? 0 points 0 points  What month? 0 points 0 points  What time? 0 points 0 points  Count back from 20 0 points 0 points  Months in reverse 0 points 0 points  Repeat phrase 0 points 0 points  Total Score 0 points 0 points    Immunizations Immunization History  Administered Date(s) Administered   Fluad Quad(high Dose 65+) 09/18/2019, 10/22/2020, 10/13/2022   Influenza,inj,Quad PF,6+ Mos 09/30/2016, 09/15/2017, 02/08/2019   Influenza-Unspecified 07/27/2013, 02/08/2019   Moderna Sars-Covid-2 Vaccination 08/25/2020, 09/21/2020   Pneumococcal Conjugate-13 04/20/2016   Pneumococcal Polysaccharide-23 04/25/2017   Pneumococcal-Unspecified 07/27/2013   Tdap 03/27/2012, 04/26/2016   Zoster, Live 07/12/2016    TDAP status: Up to date  Flu Vaccine status: Due, Education has been provided regarding the importance of this vaccine. Advised may receive this vaccine at local pharmacy or Health Dept. Aware to provide a copy of the vaccination record if obtained from local pharmacy or Health Dept. Verbalized acceptance and understanding.  Pneumococcal vaccine status: Up to date  Covid-19 vaccine status: Information provided on how to obtain vaccines.   Qualifies for Shingles Vaccine? Yes   Zostavax completed Yes   Shingrix Completed?: No.    Education has been provided regarding the importance of this vaccine. Patient has been advised to call insurance company to determine out of pocket expense if they have not yet received this vaccine. Advised  may also receive vaccine at local pharmacy or Health Dept. Verbalized acceptance and understanding.  Screening Tests Health Maintenance  Topic Date Due   FOOT EXAM  06/04/2023   INFLUENZA VACCINE  07/28/2023   Zoster Vaccines- Shingrix (1 of 2) 09/07/2023 (Originally 05/18/2000)   OPHTHALMOLOGY EXAM  09/21/2023   HEMOGLOBIN A1C  12/07/2023   Diabetic kidney evaluation - Urine ACR  02/24/2024   Diabetic kidney evaluation - eGFR measurement  06/06/2024   Medicare Annual Wellness (AWV)  08/09/2024   Colonoscopy  06/12/2025   MAMMOGRAM  06/23/2025   DTaP/Tdap/Td (3 - Td or Tdap) 04/26/2026   Pneumonia Vaccine 37+ Years old  Completed   DEXA SCAN  Completed   Hepatitis C Screening  Completed   HPV VACCINES  Aged Out   COVID-19 Vaccine  Discontinued    Health Maintenance  Health Maintenance Due  Topic Date Due   FOOT EXAM  06/04/2023   INFLUENZA VACCINE  07/28/2023    Colorectal cancer screening: Type of screening: Colonoscopy. Completed 06/12/18. Repeat every 7 years  Mammogram status: Completed 06/24/23. Repeat every year  Bone Density status: Completed 06/14/22. Results reflect: Bone density results: NORMAL. Repeat every 5 years.  Lung Cancer Screening: (Low Dose CT Chest recommended if Age 25-80 years, 20 pack-year currently smoking OR have quit w/in 15years.) does not qualify.   Lung Cancer Screening Referral:    Additional Screening:  Hepatitis C Screening: does qualify; Completed 04/20/16  Vision Screening: Recommended annual ophthalmology exams for early detection of glaucoma and other disorders of the eye. Is the patient up to date with their annual eye exam?  Yes  Who is the provider or what is the name of the office in which the patient attends annual eye exams? [facility] If pt is not established with a provider, would they like to be referred to a provider to establish care? Yes .   Dental Screening: Recommended annual dental exams for proper oral  hygiene  Diabetic Foot Exam: Diabetic Foot Exam: Completed 06/03/22  Community Resource Referral / Chronic Care Management: CRR required this visit?  No   CCM required this visit?  No     Plan:     I have personally reviewed and noted the following in the patient's chart:   Medical and social history Use of alcohol, tobacco or illicit drugs  Current medications and supplements including opioid prescriptions. Patient is not currently taking opioid prescriptions. Functional ability and status Nutritional status Physical activity Advanced directives List of other physicians Hospitalizations, surgeries, and ER visits in previous 12 months Vitals Screenings to include cognitive, depression, and falls Referrals and appointments  In addition, I have reviewed and discussed with patient certain preventive protocols, quality metrics, and best practice recommendations. A written personalized care plan for preventive services as well as general preventive health recommendations were provided to patient.     Maryan Puls, LPN   04/09/2439   After Visit Summary: (MyChart) Due to this being a telephonic visit, the after visit summary with patients personalized plan was offered to patient via MyChart   Nurse Notes: None

## 2023-08-10 NOTE — Patient Instructions (Signed)
Brenda Contreras , Thank you for taking time to come for your Medicare Wellness Visit. I appreciate your ongoing commitment to your health goals. Please review the following plan we discussed and let me know if I can assist you in the future.   Referrals/Orders/Follow-Ups/Clinician Recommendations: Aim for 30 minutes of exercise or brisk walking, 6-8 glasses of water, and 5 servings of fruits and vegetables each day.   This is a list of the screening recommended for you and due dates:  Health Maintenance  Topic Date Due   Medicare Annual Wellness Visit  05/18/2023   Complete foot exam   06/04/2023   Flu Shot  07/28/2023   Zoster (Shingles) Vaccine (1 of 2) 09/07/2023*   Eye exam for diabetics  09/21/2023   Hemoglobin A1C  12/07/2023   Yearly kidney health urinalysis for diabetes  02/24/2024   Yearly kidney function blood test for diabetes  06/06/2024   Colon Cancer Screening  06/12/2025   Mammogram  06/23/2025   DTaP/Tdap/Td vaccine (3 - Td or Tdap) 04/26/2026   Pneumonia Vaccine  Completed   DEXA scan (bone density measurement)  Completed   Hepatitis C Screening  Completed   HPV Vaccine  Aged Out   COVID-19 Vaccine  Discontinued  *Topic was postponed. The date shown is not the original due date.    Advanced directives: (Declined) Advance directive discussed with you today. Even though you declined this today, please call our office should you change your mind, and we can give you the proper paperwork for you to fill out.  Next Medicare Annual Wellness Visit scheduled for next year: Yes  Preventive Care 29 Years and Older, Female Preventive care refers to lifestyle choices and visits with your health care provider that can promote health and wellness. What does preventive care include? A yearly physical exam. This is also called an annual well check. Dental exams once or twice a year. Routine eye exams. Ask your health care provider how often you should have your eyes  checked. Personal lifestyle choices, including: Daily care of your teeth and gums. Regular physical activity. Eating a healthy diet. Avoiding tobacco and drug use. Limiting alcohol use. Practicing safe sex. Taking low-dose aspirin every day. Taking vitamin and mineral supplements as recommended by your health care provider. What happens during an annual well check? The services and screenings done by your health care provider during your annual well check will depend on your age, overall health, lifestyle risk factors, and family history of disease. Counseling  Your health care provider may ask you questions about your: Alcohol use. Tobacco use. Drug use. Emotional well-being. Home and relationship well-being. Sexual activity. Eating habits. History of falls. Memory and ability to understand (cognition). Work and work Astronomer. Reproductive health. Screening  You may have the following tests or measurements: Height, weight, and BMI. Blood pressure. Lipid and cholesterol levels. These may be checked every 5 years, or more frequently if you are over 80 years old. Skin check. Lung cancer screening. You may have this screening every year starting at age 51 if you have a 30-pack-year history of smoking and currently smoke or have quit within the past 15 years. Fecal occult blood test (FOBT) of the stool. You may have this test every year starting at age 77. Flexible sigmoidoscopy or colonoscopy. You may have a sigmoidoscopy every 5 years or a colonoscopy every 10 years starting at age 53. Hepatitis C blood test. Hepatitis B blood test. Sexually transmitted disease (STD) testing. Diabetes screening.  This is done by checking your blood sugar (glucose) after you have not eaten for a while (fasting). You may have this done every 1-3 years. Bone density scan. This is done to screen for osteoporosis. You may have this done starting at age 37. Mammogram. This may be done every 1-2  years. Talk to your health care provider about how often you should have regular mammograms. Talk with your health care provider about your test results, treatment options, and if necessary, the need for more tests. Vaccines  Your health care provider may recommend certain vaccines, such as: Influenza vaccine. This is recommended every year. Tetanus, diphtheria, and acellular pertussis (Tdap, Td) vaccine. You may need a Td booster every 10 years. Zoster vaccine. You may need this after age 15. Pneumococcal 13-valent conjugate (PCV13) vaccine. One dose is recommended after age 32. Pneumococcal polysaccharide (PPSV23) vaccine. One dose is recommended after age 20. Talk to your health care provider about which screenings and vaccines you need and how often you need them. This information is not intended to replace advice given to you by your health care provider. Make sure you discuss any questions you have with your health care provider. Document Released: 01/09/2016 Document Revised: 09/01/2016 Document Reviewed: 10/14/2015 Elsevier Interactive Patient Education  2017 [employer].  Fall Prevention in the Home Falls can cause injuries. They can happen to people of all ages. There are many things you can do to make your home safe and to help prevent falls. What can I do on the outside of my home? Regularly fix the edges of walkways and driveways and fix any cracks. Remove anything that might make you trip as you walk through a door, such as a raised step or threshold. Trim any bushes or trees on the path to your home. Use bright outdoor lighting. Clear any walking paths of anything that might make someone trip, such as rocks or tools. Regularly check to see if handrails are loose or broken. Make sure that both sides of any steps have handrails. Any raised decks and porches should have guardrails on the edges. Have any leaves, snow, or ice cleared regularly. Use sand or salt on walking paths  during winter. Clean up any spills in your garage right away. This includes oil or grease spills. What can I do in the bathroom? Use night lights. Install grab bars by the toilet and in the tub and shower. Do not use towel bars as grab bars. Use non-skid mats or decals in the tub or shower. If you need to sit down in the shower, use a plastic, non-slip stool. Keep the floor dry. Clean up any water that spills on the floor as soon as it happens. Remove soap buildup in the tub or shower regularly. Attach bath mats securely with double-sided non-slip rug tape. Do not have throw rugs and other things on the floor that can make you trip. What can I do in the bedroom? Use night lights. Make sure that you have a light by your bed that is easy to reach. Do not use any sheets or blankets that are too big for your bed. They should not hang down onto the floor. Have a firm chair that has side arms. You can use this for support while you get dressed. Do not have throw rugs and other things on the floor that can make you trip. What can I do in the kitchen? Clean up any spills right away. Avoid walking on wet floors. Keep items  that you use a lot in easy-to-reach places. If you need to reach something above you, use a strong step stool that has a grab bar. Keep electrical cords out of the way. Do not use floor polish or wax that makes floors slippery. If you must use wax, use non-skid floor wax. Do not have throw rugs and other things on the floor that can make you trip. What can I do with my stairs? Do not leave any items on the stairs. Make sure that there are handrails on both sides of the stairs and use them. Fix handrails that are broken or loose. Make sure that handrails are as long as the stairways. Check any carpeting to make sure that it is firmly attached to the stairs. Fix any carpet that is loose or worn. Avoid having throw rugs at the top or bottom of the stairs. If you do have throw  rugs, attach them to the floor with carpet tape. Make sure that you have a light switch at the top of the stairs and the bottom of the stairs. If you do not have them, ask someone to add them for you. What else can I do to help prevent falls? Wear shoes that: Do not have high heels. Have rubber bottoms. Are comfortable and fit you well. Are closed at the toe. Do not wear sandals. If you use a stepladder: Make sure that it is fully opened. Do not climb a closed stepladder. Make sure that both sides of the stepladder are locked into place. Ask someone to hold it for you, if possible. Clearly mark and make sure that you can see: Any grab bars or handrails. First and last steps. Where the edge of each step is. Use tools that help you move around (mobility aids) if they are needed. These include: Canes. Walkers. Scooters. Crutches. Turn on the lights when you go into a dark area. Replace any light bulbs as soon as they burn out. Set up your furniture so you have a clear path. Avoid moving your furniture around. If any of your floors are uneven, fix them. If there are any pets around you, be aware of where they are. Review your medicines with your doctor. Some medicines can make you feel dizzy. This can increase your chance of falling. Ask your doctor what other things that you can do to help prevent falls. This information is not intended to replace advice given to you by your health care provider. Make sure you discuss any questions you have with your health care provider. Document Released: 10/09/2009 Document Revised: 05/20/2016 Document Reviewed: 01/17/2015 Elsevier Interactive Patient Education  2017 [employer].

## 2023-09-15 ENCOUNTER — Other Ambulatory Visit: Payer: Self-pay | Admitting: Primary Care

## 2023-09-15 DIAGNOSIS — E1165 Type 2 diabetes mellitus with hyperglycemia: Secondary | ICD-10-CM

## 2023-09-15 MED ORDER — SITAGLIPTIN-METFORMIN HCL 50-1000 MG PO TABS
1.0000 | ORAL_TABLET | Freq: Two times a day (BID) | ORAL | 0 refills | Status: AC
Start: 2023-09-15 — End: ?

## 2023-09-26 DIAGNOSIS — H2513 Age-related nuclear cataract, bilateral: Secondary | ICD-10-CM | POA: Diagnosis not present

## 2023-09-26 LAB — HM DIABETES EYE EXAM

## 2023-10-24 DIAGNOSIS — E1165 Type 2 diabetes mellitus with hyperglycemia: Secondary | ICD-10-CM

## 2023-10-24 MED ORDER — DAPAGLIFLOZIN PROPANEDIOL 10 MG PO TABS
10.0000 mg | ORAL_TABLET | Freq: Every day | ORAL | 0 refills | Status: AC
Start: 2023-10-24 — End: ?

## 2023-12-07 ENCOUNTER — Ambulatory Visit: Payer: Medicare HMO | Admitting: Primary Care

## 2023-12-22 ENCOUNTER — Ambulatory Visit: Payer: Medicare HMO | Admitting: Primary Care

## 2024-01-04 DIAGNOSIS — E538 Deficiency of other specified B group vitamins: Secondary | ICD-10-CM | POA: Diagnosis not present

## 2024-01-11 DIAGNOSIS — E538 Deficiency of other specified B group vitamins: Secondary | ICD-10-CM | POA: Diagnosis not present

## 2024-01-18 DIAGNOSIS — I739 Peripheral vascular disease, unspecified: Secondary | ICD-10-CM | POA: Diagnosis not present

## 2024-01-18 DIAGNOSIS — E538 Deficiency of other specified B group vitamins: Secondary | ICD-10-CM | POA: Diagnosis not present

## 2024-01-25 DIAGNOSIS — E538 Deficiency of other specified B group vitamins: Secondary | ICD-10-CM | POA: Diagnosis not present

## 2024-01-26 DIAGNOSIS — E119 Type 2 diabetes mellitus without complications: Secondary | ICD-10-CM | POA: Diagnosis not present

## 2024-01-26 DIAGNOSIS — N1831 Chronic kidney disease, stage 3a: Secondary | ICD-10-CM | POA: Diagnosis not present

## 2024-01-26 DIAGNOSIS — I10 Essential (primary) hypertension: Secondary | ICD-10-CM | POA: Diagnosis not present

## 2024-01-26 DIAGNOSIS — E785 Hyperlipidemia, unspecified: Secondary | ICD-10-CM | POA: Diagnosis not present

## 2024-03-07 DIAGNOSIS — M25562 Pain in left knee: Secondary | ICD-10-CM | POA: Diagnosis not present

## 2024-03-19 DIAGNOSIS — M25562 Pain in left knee: Secondary | ICD-10-CM | POA: Diagnosis not present

## 2024-04-09 DIAGNOSIS — N189 Chronic kidney disease, unspecified: Secondary | ICD-10-CM | POA: Diagnosis not present

## 2024-04-09 DIAGNOSIS — E785 Hyperlipidemia, unspecified: Secondary | ICD-10-CM | POA: Diagnosis not present

## 2024-04-09 DIAGNOSIS — E1122 Type 2 diabetes mellitus with diabetic chronic kidney disease: Secondary | ICD-10-CM | POA: Diagnosis not present

## 2024-04-09 DIAGNOSIS — M25569 Pain in unspecified knee: Secondary | ICD-10-CM | POA: Diagnosis not present

## 2024-04-09 DIAGNOSIS — I129 Hypertensive chronic kidney disease with stage 1 through stage 4 chronic kidney disease, or unspecified chronic kidney disease: Secondary | ICD-10-CM | POA: Diagnosis not present

## 2024-08-03 ENCOUNTER — Other Ambulatory Visit: Payer: Self-pay | Admitting: Obstetrics and Gynecology

## 2024-08-03 DIAGNOSIS — Z1231 Encounter for screening mammogram for malignant neoplasm of breast: Secondary | ICD-10-CM

## 2024-08-23 ENCOUNTER — Ambulatory Visit
Admission: RE | Admit: 2024-08-23 | Discharge: 2024-08-23 | Disposition: A | Discharge status: Home / Self Care | Source: Ambulatory Visit | Attending: Obstetrics and Gynecology | Admitting: Obstetrics and Gynecology

## 2024-08-23 DIAGNOSIS — Z1231 Encounter for screening mammogram for malignant neoplasm of breast: Secondary | ICD-10-CM | POA: Diagnosis present

## 2024-08-28 ENCOUNTER — Encounter: Payer: Self-pay | Admitting: Obstetrics and Gynecology

## 2024-09-06 ENCOUNTER — Other Ambulatory Visit: Payer: Self-pay | Admitting: Obstetrics and Gynecology

## 2024-09-06 DIAGNOSIS — R928 Other abnormal and inconclusive findings on diagnostic imaging of breast: Secondary | ICD-10-CM

## 2024-09-07 ENCOUNTER — Ambulatory Visit
Admission: RE | Admit: 2024-09-07 | Discharge: 2024-09-07 | Disposition: A | Discharge status: Home / Self Care | Source: Ambulatory Visit | Attending: Obstetrics and Gynecology | Admitting: Obstetrics and Gynecology

## 2024-09-07 DIAGNOSIS — R928 Other abnormal and inconclusive findings on diagnostic imaging of breast: Secondary | ICD-10-CM | POA: Diagnosis present

## 2024-12-17 ENCOUNTER — Other Ambulatory Visit (HOSPITAL_COMMUNITY): Payer: Self-pay

## 2024-12-17 MED ORDER — FLUZONE HIGH-DOSE 0.5 ML IM SUSY
0.5000 mL | PREFILLED_SYRINGE | Freq: Once | INTRAMUSCULAR | 0 refills | Status: AC
  Filled 2024-12-17: qty 0.5, 1d supply, fill #0
